# Patient Record
Sex: Male | Born: 1970 | Race: White | Hispanic: No | Smoking: Former smoker
Health system: Southern US, Community
[De-identification: ages and names within clinical notes are randomized; demographics above are authoritative.]

## PROBLEM LIST (undated history)

## (undated) DIAGNOSIS — S0990XA Unspecified injury of head, initial encounter: Secondary | ICD-10-CM

## (undated) DIAGNOSIS — C787 Secondary malignant neoplasm of liver and intrahepatic bile duct: Secondary | ICD-10-CM

## (undated) DIAGNOSIS — R569 Unspecified convulsions: Secondary | ICD-10-CM

## (undated) DIAGNOSIS — K029 Dental caries, unspecified: Secondary | ICD-10-CM

## (undated) DIAGNOSIS — C7931 Secondary malignant neoplasm of brain: Secondary | ICD-10-CM

## (undated) DIAGNOSIS — C3490 Malignant neoplasm of unspecified part of unspecified bronchus or lung: Secondary | ICD-10-CM

## (undated) DIAGNOSIS — I10 Essential (primary) hypertension: Secondary | ICD-10-CM

## (undated) DIAGNOSIS — IMO0001 Reserved for inherently not codable concepts without codable children: Secondary | ICD-10-CM

## (undated) DIAGNOSIS — C801 Malignant (primary) neoplasm, unspecified: Secondary | ICD-10-CM

## (undated) DIAGNOSIS — IMO0002 Reserved for concepts with insufficient information to code with codable children: Secondary | ICD-10-CM

## (undated) HISTORY — DX: Unspecified convulsions: R56.9

## (undated) HISTORY — DX: Essential (primary) hypertension: I10

## (undated) HISTORY — DX: Reserved for concepts with insufficient information to code with codable children: IMO0002

## (undated) HISTORY — DX: Reserved for inherently not codable concepts without codable children: IMO0001

---

## 2005-02-15 DIAGNOSIS — S0990XA Unspecified injury of head, initial encounter: Secondary | ICD-10-CM

## 2005-02-15 HISTORY — PX: CRANIOTOMY: SHX93

## 2005-02-15 HISTORY — DX: Unspecified injury of head, initial encounter: S09.90XA

## 2005-03-09 ENCOUNTER — Inpatient Hospital Stay (HOSPITAL_COMMUNITY): Admission: AC | Admit: 2005-03-09 | Discharge: 2005-03-13 | Payer: Self-pay

## 2005-03-22 ENCOUNTER — Ambulatory Visit (HOSPITAL_COMMUNITY): Admission: RE | Admit: 2005-03-22 | Discharge: 2005-03-22 | Payer: Self-pay | Admitting: Otolaryngology

## 2014-02-15 DIAGNOSIS — K029 Dental caries, unspecified: Secondary | ICD-10-CM

## 2014-02-15 HISTORY — DX: Dental caries, unspecified: K02.9

## 2014-04-16 DIAGNOSIS — C7931 Secondary malignant neoplasm of brain: Secondary | ICD-10-CM

## 2014-04-16 DIAGNOSIS — C3490 Malignant neoplasm of unspecified part of unspecified bronchus or lung: Secondary | ICD-10-CM

## 2014-04-16 DIAGNOSIS — C787 Secondary malignant neoplasm of liver and intrahepatic bile duct: Secondary | ICD-10-CM

## 2014-04-16 HISTORY — DX: Malignant neoplasm of unspecified part of unspecified bronchus or lung: C34.90

## 2014-04-16 HISTORY — DX: Secondary malignant neoplasm of liver and intrahepatic bile duct: C78.7

## 2014-04-16 HISTORY — DX: Secondary malignant neoplasm of brain: C79.31

## 2014-05-13 ENCOUNTER — Other Ambulatory Visit (HOSPITAL_COMMUNITY): Payer: Self-pay

## 2014-05-13 ENCOUNTER — Emergency Department (HOSPITAL_COMMUNITY): Payer: Medicaid Other

## 2014-05-13 ENCOUNTER — Other Ambulatory Visit: Payer: Self-pay

## 2014-05-13 ENCOUNTER — Inpatient Hospital Stay (HOSPITAL_COMMUNITY): Payer: Medicaid Other

## 2014-05-13 ENCOUNTER — Inpatient Hospital Stay (HOSPITAL_COMMUNITY)
Admission: EM | Admit: 2014-05-13 | Discharge: 2014-05-24 | DRG: 208 | Disposition: A | Discharge status: Home Health | Payer: Medicaid Other | Attending: Internal Medicine | Admitting: Internal Medicine

## 2014-05-13 ENCOUNTER — Encounter (HOSPITAL_COMMUNITY): Payer: Self-pay | Admitting: Radiology

## 2014-05-13 DIAGNOSIS — J95811 Postprocedural pneumothorax: Secondary | ICD-10-CM | POA: Diagnosis not present

## 2014-05-13 DIAGNOSIS — F1010 Alcohol abuse, uncomplicated: Secondary | ICD-10-CM | POA: Diagnosis present

## 2014-05-13 DIAGNOSIS — S12600A Unspecified displaced fracture of seventh cervical vertebra, initial encounter for closed fracture: Secondary | ICD-10-CM | POA: Diagnosis present

## 2014-05-13 DIAGNOSIS — R739 Hyperglycemia, unspecified: Secondary | ICD-10-CM | POA: Diagnosis present

## 2014-05-13 DIAGNOSIS — S50312A Abrasion of left elbow, initial encounter: Secondary | ICD-10-CM | POA: Diagnosis present

## 2014-05-13 DIAGNOSIS — C3490 Malignant neoplasm of unspecified part of unspecified bronchus or lung: Secondary | ICD-10-CM | POA: Diagnosis present

## 2014-05-13 DIAGNOSIS — C7931 Secondary malignant neoplasm of brain: Secondary | ICD-10-CM | POA: Diagnosis present

## 2014-05-13 DIAGNOSIS — Y900 Blood alcohol level of less than 20 mg/100 ml: Secondary | ICD-10-CM | POA: Diagnosis present

## 2014-05-13 DIAGNOSIS — N179 Acute kidney failure, unspecified: Secondary | ICD-10-CM | POA: Diagnosis present

## 2014-05-13 DIAGNOSIS — Y838 Other surgical procedures as the cause of abnormal reaction of the patient, or of later complication, without mention of misadventure at the time of the procedure: Secondary | ICD-10-CM | POA: Diagnosis not present

## 2014-05-13 DIAGNOSIS — Y92238 Other place in hospital as the place of occurrence of the external cause: Secondary | ICD-10-CM | POA: Diagnosis not present

## 2014-05-13 DIAGNOSIS — Z791 Long term (current) use of non-steroidal anti-inflammatories (NSAID): Secondary | ICD-10-CM | POA: Diagnosis not present

## 2014-05-13 DIAGNOSIS — M4856XA Collapsed vertebra, not elsewhere classified, lumbar region, initial encounter for fracture: Secondary | ICD-10-CM | POA: Diagnosis present

## 2014-05-13 DIAGNOSIS — R4701 Aphasia: Secondary | ICD-10-CM | POA: Diagnosis present

## 2014-05-13 DIAGNOSIS — T1490 Injury, unspecified: Secondary | ICD-10-CM | POA: Diagnosis present

## 2014-05-13 DIAGNOSIS — C787 Secondary malignant neoplasm of liver and intrahepatic bile duct: Secondary | ICD-10-CM | POA: Diagnosis present

## 2014-05-13 DIAGNOSIS — T1490XA Injury, unspecified, initial encounter: Secondary | ICD-10-CM | POA: Insufficient documentation

## 2014-05-13 DIAGNOSIS — R451 Restlessness and agitation: Secondary | ICD-10-CM | POA: Diagnosis present

## 2014-05-13 DIAGNOSIS — I10 Essential (primary) hypertension: Secondary | ICD-10-CM | POA: Diagnosis present

## 2014-05-13 DIAGNOSIS — R001 Bradycardia, unspecified: Secondary | ICD-10-CM | POA: Diagnosis present

## 2014-05-13 DIAGNOSIS — R1310 Dysphagia, unspecified: Secondary | ICD-10-CM | POA: Diagnosis present

## 2014-05-13 DIAGNOSIS — M549 Dorsalgia, unspecified: Secondary | ICD-10-CM | POA: Diagnosis present

## 2014-05-13 DIAGNOSIS — D72829 Elevated white blood cell count, unspecified: Secondary | ICD-10-CM | POA: Diagnosis present

## 2014-05-13 DIAGNOSIS — E872 Acidosis: Secondary | ICD-10-CM | POA: Diagnosis present

## 2014-05-13 DIAGNOSIS — Z9889 Other specified postprocedural states: Secondary | ICD-10-CM

## 2014-05-13 DIAGNOSIS — T380X5A Adverse effect of glucocorticoids and synthetic analogues, initial encounter: Secondary | ICD-10-CM | POA: Diagnosis present

## 2014-05-13 DIAGNOSIS — G9341 Metabolic encephalopathy: Secondary | ICD-10-CM | POA: Diagnosis present

## 2014-05-13 DIAGNOSIS — C3411 Malignant neoplasm of upper lobe, right bronchus or lung: Secondary | ICD-10-CM | POA: Diagnosis present

## 2014-05-13 DIAGNOSIS — S12500A Unspecified displaced fracture of sixth cervical vertebra, initial encounter for closed fracture: Secondary | ICD-10-CM | POA: Diagnosis present

## 2014-05-13 DIAGNOSIS — G40901 Epilepsy, unspecified, not intractable, with status epilepticus: Secondary | ICD-10-CM | POA: Diagnosis present

## 2014-05-13 DIAGNOSIS — E876 Hypokalemia: Secondary | ICD-10-CM | POA: Diagnosis present

## 2014-05-13 DIAGNOSIS — R918 Other nonspecific abnormal finding of lung field: Secondary | ICD-10-CM

## 2014-05-13 DIAGNOSIS — R0602 Shortness of breath: Secondary | ICD-10-CM

## 2014-05-13 DIAGNOSIS — F17210 Nicotine dependence, cigarettes, uncomplicated: Secondary | ICD-10-CM | POA: Diagnosis present

## 2014-05-13 DIAGNOSIS — G9340 Encephalopathy, unspecified: Secondary | ICD-10-CM | POA: Diagnosis present

## 2014-05-13 DIAGNOSIS — C799 Secondary malignant neoplasm of unspecified site: Secondary | ICD-10-CM

## 2014-05-13 DIAGNOSIS — Z9289 Personal history of other medical treatment: Secondary | ICD-10-CM

## 2014-05-13 DIAGNOSIS — C801 Malignant (primary) neoplasm, unspecified: Secondary | ICD-10-CM

## 2014-05-13 DIAGNOSIS — J9601 Acute respiratory failure with hypoxia: Secondary | ICD-10-CM | POA: Diagnosis present

## 2014-05-13 DIAGNOSIS — S1093XA Contusion of unspecified part of neck, initial encounter: Secondary | ICD-10-CM

## 2014-05-13 DIAGNOSIS — R160 Hepatomegaly, not elsewhere classified: Secondary | ICD-10-CM

## 2014-05-13 DIAGNOSIS — R569 Unspecified convulsions: Secondary | ICD-10-CM

## 2014-05-13 HISTORY — DX: Unspecified injury of head, initial encounter: S09.90XA

## 2014-05-13 HISTORY — DX: Dental caries, unspecified: K02.9

## 2014-05-13 LAB — PREPARE FRESH FROZEN PLASMA
UNIT DIVISION: 0
Unit division: 0

## 2014-05-13 LAB — TYPE AND SCREEN
ABO/RH(D): O NEG
Antibody Screen: NEGATIVE
Unit division: 0
Unit division: 0

## 2014-05-13 LAB — I-STAT ARTERIAL BLOOD GAS, ED
Acid-base deficit: 9 mmol/L — ABNORMAL HIGH (ref 0.0–2.0)
Acid-base deficit: 8 mmol/L — ABNORMAL HIGH (ref 0.0–2.0)
Bicarbonate: 19.5 mEq/L — ABNORMAL LOW (ref 20.0–24.0)
Bicarbonate: 17.4 mEq/L — ABNORMAL LOW (ref 20.0–24.0)
O2 Saturation: 100 %
O2 Saturation: 99 %
PH ART: 7.183 — AB (ref 7.350–7.450)
TCO2: 21 mmol/L (ref 0–100)
TCO2: 18 mmol/L (ref 0–100)
pCO2 arterial: 51.9 mmHg — ABNORMAL HIGH (ref 35.0–45.0)
pCO2 arterial: 33.5 mmHg — ABNORMAL LOW (ref 35.0–45.0)
pH, Arterial: 7.324 — ABNORMAL LOW (ref 7.350–7.450)
pO2, Arterial: 286 mmHg — ABNORMAL HIGH (ref 80.0–100.0)
pO2, Arterial: 160 mmHg — ABNORMAL HIGH (ref 80.0–100.0)

## 2014-05-13 LAB — COMPREHENSIVE METABOLIC PANEL
ALBUMIN: 3.2 g/dL — AB (ref 3.5–5.2)
ALK PHOS: 119 U/L — AB (ref 39–117)
ALT: 52 U/L (ref 0–53)
ALT: 44 U/L (ref 0–53)
ANION GAP: 23 — AB (ref 5–15)
ANION GAP: 8 (ref 5–15)
AST: 99 U/L — ABNORMAL HIGH (ref 0–37)
AST: 77 U/L — ABNORMAL HIGH (ref 0–37)
Albumin: 3.9 g/dL (ref 3.5–5.2)
Alkaline Phosphatase: 177 U/L — ABNORMAL HIGH (ref 39–117)
BILIRUBIN TOTAL: 0.6 mg/dL (ref 0.3–1.2)
BUN: 10 mg/dL (ref 6–23)
BUN: 10 mg/dL (ref 6–23)
CALCIUM: 9.7 mg/dL (ref 8.4–10.5)
CHLORIDE: 107 mmol/L (ref 96–112)
CO2: 13 mmol/L — AB (ref 19–32)
CO2: 18 mmol/L — ABNORMAL LOW (ref 19–32)
CREATININE: 1.23 mg/dL (ref 0.50–1.35)
CREATININE: 0.96 mg/dL (ref 0.50–1.35)
Calcium: 7.8 mg/dL — ABNORMAL LOW (ref 8.4–10.5)
Chloride: 102 mmol/L (ref 96–112)
GFR calc Af Amer: 80 mL/min — ABNORMAL LOW (ref >90)
GFR calc Af Amer: >90 mL/min (ref >90)
GFR calc non Af Amer: 69 mL/min — ABNORMAL LOW (ref >90)
Glucose, Bld: 272 mg/dL — ABNORMAL HIGH (ref 70–99)
Glucose, Bld: 242 mg/dL — ABNORMAL HIGH (ref 70–99)
Potassium: 3.2 mmol/L — ABNORMAL LOW (ref 3.5–5.1)
Potassium: 3.6 mmol/L (ref 3.5–5.1)
Sodium: 138 mmol/L (ref 135–145)
Sodium: 133 mmol/L — ABNORMAL LOW (ref 135–145)
TOTAL PROTEIN: 7.9 g/dL (ref 6.0–8.3)
Total Bilirubin: 0.7 mg/dL (ref 0.3–1.2)
Total Protein: 6.2 g/dL (ref 6.0–8.3)

## 2014-05-13 LAB — CBC
HCT: 52.9 % — ABNORMAL HIGH (ref 39.0–52.0)
Hemoglobin: 17.8 g/dL — ABNORMAL HIGH (ref 13.0–17.0)
MCH: 32.1 pg (ref 26.0–34.0)
MCHC: 33.6 g/dL (ref 30.0–36.0)
MCV: 95.5 fL (ref 78.0–100.0)
Platelets: 223 10*3/uL (ref 150–400)
RBC: 5.54 MIL/uL (ref 4.22–5.81)
RDW: 12.3 % (ref 11.5–15.5)
WBC: 11.8 10*3/uL — AB (ref 4.0–10.5)

## 2014-05-13 LAB — CDS SEROLOGY

## 2014-05-13 LAB — ABO/RH: ABO/RH(D): O NEG

## 2014-05-13 LAB — CBC WITH DIFFERENTIAL/PLATELET
Basophils Absolute: 0 10*3/uL (ref 0.0–0.1)
Basophils Relative: 0 % (ref 0–1)
EOS ABS: 0 10*3/uL (ref 0.0–0.7)
Eosinophils Relative: 0 % (ref 0–5)
HCT: 43.6 % (ref 39.0–52.0)
HEMOGLOBIN: 15.5 g/dL (ref 13.0–17.0)
LYMPHS ABS: 0.5 10*3/uL — AB (ref 0.7–4.0)
LYMPHS PCT: 4 % — AB (ref 12–46)
MCH: 31.8 pg (ref 26.0–34.0)
MCHC: 35.6 g/dL (ref 30.0–36.0)
MCV: 89.3 fL (ref 78.0–100.0)
MONOS PCT: 9 % (ref 3–12)
Monocytes Absolute: 1 10*3/uL (ref 0.1–1.0)
Neutro Abs: 9.7 10*3/uL — ABNORMAL HIGH (ref 1.7–7.7)
Neutrophils Relative %: 87 % — ABNORMAL HIGH (ref 43–77)
PLATELETS: 200 10*3/uL (ref 150–400)
RBC: 4.88 MIL/uL (ref 4.22–5.81)
RDW: 12.1 % (ref 11.5–15.5)
WBC: 11.2 10*3/uL — AB (ref 4.0–10.5)

## 2014-05-13 LAB — GLUCOSE, CAPILLARY
Glucose-Capillary: 180 mg/dL — ABNORMAL HIGH (ref 70–99)
Glucose-Capillary: 139 mg/dL — ABNORMAL HIGH (ref 70–99)
Glucose-Capillary: 124 mg/dL — ABNORMAL HIGH (ref 70–99)

## 2014-05-13 LAB — PROTIME-INR
INR: 1.22 (ref 0.00–1.49)
INR: 1.18 (ref 0.00–1.49)
PROTHROMBIN TIME: 15.1 s (ref 11.6–15.2)
Prothrombin Time: 15.5 seconds — ABNORMAL HIGH (ref 11.6–15.2)

## 2014-05-13 LAB — MAGNESIUM: Magnesium: 2.4 mg/dL (ref 1.5–2.5)

## 2014-05-13 LAB — ETHANOL: Alcohol, Ethyl (B): <5 mg/dL (ref 0–9)

## 2014-05-13 LAB — LACTIC ACID, PLASMA
LACTIC ACID, VENOUS: 1.3 mmol/L (ref 0.5–2.0)
Lactic Acid, Venous: 1.8 mmol/L (ref 0.5–2.0)

## 2014-05-13 LAB — PHOSPHORUS: Phosphorus: <1 mg/dL — CL (ref 2.3–4.6)

## 2014-05-13 LAB — APTT: APTT: 28 s (ref 24–37)

## 2014-05-13 LAB — I-STAT TROPONIN, ED: TROPONIN I, POC: 0 ng/mL (ref 0.00–0.08)

## 2014-05-13 LAB — MRSA PCR SCREENING: MRSA BY PCR: NEGATIVE

## 2014-05-13 LAB — TRIGLYCERIDES: Triglycerides: 237 mg/dL — ABNORMAL HIGH (ref <150)

## 2014-05-13 MED ORDER — SODIUM CHLORIDE 0.9 % IV SOLN
5.0000 ug/h | INTRAVENOUS | Status: DC
  Filled 2014-05-13: qty 50

## 2014-05-13 MED ORDER — FENTANYL CITRATE 0.05 MG/ML IJ SOLN
INTRAMUSCULAR | Status: AC | PRN
  Administered 2014-05-13: 50 ug via INTRAVENOUS

## 2014-05-13 MED ORDER — PROPOFOL 10 MG/ML IV BOLUS
INTRAVENOUS | Status: AC | PRN
  Administered 2014-05-13: 25 ug via INTRAVENOUS

## 2014-05-13 MED ORDER — LEVETIRACETAM IN NACL 1000 MG/100ML IV SOLN
1000.0000 mg | Freq: Two times a day (BID) | INTRAVENOUS | Status: DC
  Administered 2014-05-13: 1000 mg via INTRAVENOUS
  Filled 2014-05-13 (×2): qty 100

## 2014-05-13 MED ORDER — METOPROLOL TARTRATE 25 MG/10 ML ORAL SUSPENSION
25.0000 mg | Freq: Two times a day (BID) | ORAL | Status: DC
  Filled 2014-05-13 (×6): qty 10

## 2014-05-13 MED ORDER — IOHEXOL 350 MG/ML SOLN
100.0000 mL | Freq: Once | INTRAVENOUS | Status: AC | PRN
  Administered 2014-05-13: 100 mL via INTRAVENOUS

## 2014-05-13 MED ORDER — PROPOFOL 10 MG/ML IV EMUL
INTRAVENOUS | Status: DC | PRN
  Administered 2014-05-13: 30 ug/kg/min via INTRAVENOUS

## 2014-05-13 MED ORDER — PROPOFOL 10 MG/ML IV EMUL
INTRAVENOUS | Status: AC | PRN
  Administered 2014-05-13: 25 ug/kg/min via INTRAVENOUS

## 2014-05-13 MED ORDER — CETYLPYRIDINIUM CHLORIDE 0.05 % MT LIQD
7.0000 mL | Freq: Four times a day (QID) | OROMUCOSAL | Status: DC
  Administered 2014-05-14 (×4): 7 mL via OROMUCOSAL

## 2014-05-13 MED ORDER — FENTANYL CITRATE 0.05 MG/ML IJ SOLN
100.0000 ug | INTRAMUSCULAR | Status: DC | PRN
  Administered 2014-05-13 – 2014-05-14 (×5): 100 ug via INTRAVENOUS
  Filled 2014-05-13 (×5): qty 2

## 2014-05-13 MED ORDER — FENTANYL CITRATE 0.05 MG/ML IJ SOLN
100.0000 ug | INTRAMUSCULAR | Status: DC | PRN
  Administered 2014-05-13 (×2): 100 ug via INTRAVENOUS
  Filled 2014-05-13 (×3): qty 2

## 2014-05-13 MED ORDER — SODIUM CHLORIDE 0.9 % IV SOLN
INTRAVENOUS | Status: AC | PRN
  Administered 2014-05-13: 1000 mL/h via INTRAVENOUS

## 2014-05-13 MED ORDER — DEXAMETHASONE SODIUM PHOSPHATE 10 MG/ML IJ SOLN
10.0000 mg | Freq: Once | INTRAMUSCULAR | Status: AC
  Administered 2014-05-13: 10 mg via INTRAVENOUS
  Filled 2014-05-13: qty 1

## 2014-05-13 MED ORDER — SODIUM PHOSPHATE 3 MMOLE/ML IV SOLN
30.0000 mmol | Freq: Once | INTRAVENOUS | Status: AC
  Administered 2014-05-13: 30 mmol via INTRAVENOUS
  Filled 2014-05-13: qty 10

## 2014-05-13 MED ORDER — PROPOFOL 10 MG/ML IV EMUL
0.0000 ug/kg/min | INTRAVENOUS | Status: DC
  Administered 2014-05-13 – 2014-05-14 (×7): 50 ug/kg/min via INTRAVENOUS
  Filled 2014-05-13 (×7): qty 100

## 2014-05-13 MED ORDER — SODIUM CHLORIDE 0.9 % IV BOLUS (SEPSIS)
1000.0000 mL | Freq: Once | INTRAVENOUS | Status: AC
  Administered 2014-05-13: 1000 mL via INTRAVENOUS

## 2014-05-13 MED ORDER — FENTANYL CITRATE 0.05 MG/ML IJ SOLN
INTRAMUSCULAR | Status: AC
  Filled 2014-05-13: qty 2

## 2014-05-13 MED ORDER — CHLORHEXIDINE GLUCONATE 0.12 % MT SOLN
15.0000 mL | Freq: Two times a day (BID) | OROMUCOSAL | Status: DC
  Administered 2014-05-13 – 2014-05-14 (×2): 15 mL via OROMUCOSAL
  Filled 2014-05-13 (×2): qty 15

## 2014-05-13 MED ORDER — LORAZEPAM 2 MG/ML IJ SOLN
INTRAMUSCULAR | Status: AC | PRN
  Administered 2014-05-13: 2 mg via INTRAVENOUS

## 2014-05-13 MED ORDER — POTASSIUM CHLORIDE 10 MEQ/100ML IV SOLN
10.0000 meq | INTRAVENOUS | Status: AC
  Administered 2014-05-13 (×2): 10 meq via INTRAVENOUS
  Filled 2014-05-13 (×2): qty 100

## 2014-05-13 MED ORDER — ROCURONIUM BROMIDE 50 MG/5ML IV SOLN
INTRAVENOUS | Status: AC | PRN
  Administered 2014-05-13: 100 mg via INTRAVENOUS

## 2014-05-13 MED ORDER — LEVETIRACETAM IN NACL 500 MG/100ML IV SOLN
500.0000 mg | Freq: Two times a day (BID) | INTRAVENOUS | Status: DC
  Administered 2014-05-13 – 2014-05-14 (×3): 500 mg via INTRAVENOUS
  Filled 2014-05-13 (×5): qty 100

## 2014-05-13 MED ORDER — INSULIN ASPART 100 UNIT/ML ~~LOC~~ SOLN
0.0000 [IU] | SUBCUTANEOUS | Status: DC
  Administered 2014-05-13: 3 [IU] via SUBCUTANEOUS
  Administered 2014-05-13 – 2014-05-14 (×5): 2 [IU] via SUBCUTANEOUS
  Administered 2014-05-14 – 2014-05-15 (×2): 3 [IU] via SUBCUTANEOUS

## 2014-05-13 MED ORDER — ETOMIDATE 2 MG/ML IV SOLN
INTRAVENOUS | Status: AC | PRN
  Administered 2014-05-13: 20 mg via INTRAVENOUS

## 2014-05-13 MED ORDER — DEXAMETHASONE SODIUM PHOSPHATE 4 MG/ML IJ SOLN
4.0000 mg | Freq: Four times a day (QID) | INTRAMUSCULAR | Status: DC
  Administered 2014-05-13 – 2014-05-23 (×38): 4 mg via INTRAVENOUS
  Filled 2014-05-13 (×47): qty 1

## 2014-05-13 MED ORDER — LORAZEPAM 2 MG/ML IJ SOLN
2.0000 mg | Freq: Once | INTRAMUSCULAR | Status: AC
  Administered 2014-05-13: 2 mg via INTRAVENOUS
  Filled 2014-05-13: qty 1

## 2014-05-13 MED ORDER — LORAZEPAM 2 MG/ML IJ SOLN
INTRAMUSCULAR | Status: AC
  Filled 2014-05-13: qty 1

## 2014-05-13 NOTE — Progress Notes (Signed)
Stable. Intubated, spoke with fiancee

## 2014-05-13 NOTE — Progress Notes (Signed)
eLink Physician-Brief Progress Note Patient Name: [name] DOB: 11-Aug-1970 MRN: [id number]   Date of Service  05/13/2014  HPI/Events of Note  Phosphorus level < 1.0.  eICU Interventions  Replete phosphorus. Recheck phosphorus level at 12 midnight.      Intervention Category Major Interventions: Electrolyte abnormality - evaluation and management  Sommer,Steven Eugene 05/13/2014, 5:08 PM
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
## 2014-05-13 NOTE — ED Notes (Signed)
Spoke with Dr. Nelda Marseille concerning [facility]. Dr. Nelda Marseille felt he could go to neuro ICU. Bed control called at this time and [facility] called to inform.

## 2014-05-13 NOTE — Progress Notes (Signed)
PCCM Interval Progress Note  Met with pt's two sisters [name] and Alyssa) as well as pt's fiance Cyril Mourning).  Extensive discussion had as I updated them on pt's current status as well as radiologic findings concerning for malignancy with metastatic disease, unknown primary. Family in shock as this is totally new and they had no idea of possible underlying malignancy.  They state that besides HTN, pt was otherwise healthy.  He had no recent symptoms at all and has never had to see a PCP for any chronic medical problems.  They are leaning towards aggressive care including biopsies and wish to meet with heme - onc to discuss possible treatments.  They do realize that there is a concern for advanced cancer given radiologic suggestion of metastasis.  They would like to defer decision on pursuing biopsies (we briefly discussed liver biopsy via IR) until pt is extubated and has time to take in these findings and come up with a decision on how he would like to proceed.  They will be at the bedside tomorrow morning, 05/14/14 by 900am in hopes that pt can be extubated so that they can begin discussions on how to move forward from here.   Additional CC time:  20 minutes.   Montey Hora, [place] [facility] Pager: [phone]  or [phone] 05/13/2014, 4:06 PM

## 2014-05-13 NOTE — ED Notes (Signed)
Dr. Reather Converse made aware of pt's BP

## 2014-05-13 NOTE — Progress Notes (Signed)
Patient and ct see. Spoke with wife. He will need the cervical brace for at least 6 to 8 weeks. Once he is awake will talk to him about the tumors. Note dictated

## 2014-05-13 NOTE — ED Notes (Signed)
RT aware pt ready for transport to [facility]
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
## 2014-05-13 NOTE — ED Notes (Signed)
Family at beside. Family given emotional support. 

## 2014-05-13 NOTE — Progress Notes (Signed)
CRITICAL VALUE ALERT  Critical value received:  Phosphorus <1.0  Date of notification:  05/13/14  Time of notification:  1601 (from ED)  Critical value read back: yes  Nurse who received alert:  Dulcy Fanny, RN  MD notified (1st page):  called elink to notify MD.  Time of first page:   1700 - called elink  MD notified (2nd page):  Time of second page:  Responding MD:  elink RN to notify MD  Time MD responded:
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
## 2014-05-13 NOTE — Progress Notes (Signed)
Chaplain referred to pt family from ED chaplain. Chaplain introduced herself and her services to pt fiance, Cyril Mourning, and pt sisters, [name] and Alyssa. Chaplain introduced pt family to nurse. Chaplain will continue to follow.    05/13/14 1500  Clinical Encounter Type  Visited With Family  Visit Type Follow-up;Spiritual support  Referral From [place] [facility]eds Emotional  Thea Holshouser, Epifanio Lesches 05/13/2014 3:13 PM

## 2014-05-13 NOTE — Progress Notes (Signed)
eLink Physician-Brief Progress Note Patient Name: Jaremy Nosal DOB: December 09, 1970 MRN: [id number]   Date of Service  05/13/2014  HPI/Events of Note  Notified of need for DVT prophylaxis.   eICU Interventions  Will order SCD's.     Intervention Category Intermediate Interventions: Best-practice therapies (e.g. DVT, beta blocker, etc.)  Alassane Kalafut Eugene 05/13/2014, 4:39 PM
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
## 2014-05-13 NOTE — ED Notes (Signed)
RT back in to adjust vent settings for sats. sats back up to 98%

## 2014-05-13 NOTE — ED Notes (Signed)
Attempted report 

## 2014-05-13 NOTE — Procedures (Signed)
ELECTROENCEPHALOGRAM REPORT   Patient: [name]      Room #: Trauma B [facility] ED Age: 44 y.o.        Sex: male Referring Physician: Dr Nelda Marseille Report Date:  05/13/2014        Interpreting Physician: Hulen Luster  History: Maddax Palinkas is an 44 y.o. male new onset seizures with CT imaging concerning for metastatic disease  Medications:  Scheduled: . dexamethasone  10 mg Intravenous Once  . fentaNYL      . insulin aspart  0-15 Units Subcutaneous 6 times per day  . levETIRAcetam  1,000 mg Intravenous Q12H  . LORazepam      . metoprolol tartrate  25 mg Per Tube BID    Conditions of Recording:  This is a 16 channel EEG carried out with the patient in the sedeated state.  Description:  The waking background activity consists of a low voltage, symmetrical, fairly well organized, 9-11 Hz alpha activity, seen from the parieto-occipital and posterior temporal regions. Intermittent posterior theta activity is mixed into the posterior region. Low voltage fast activity, poorly organized, is seen anteriorly and is at times superimposed on more posterior regions.   Normal sleep architecture is not observed. Focal slowing is noted in the left temporal region. No epileptiform activity is noted.   Photic stimulation and hyperventilation are not performed.    IMPRESSION: Abnormal EEG due to intermittent focal slowing in the left temporal region indicating a potential seizure foci. No epileptiform activity or discharges noted.    Jim Like, DO [facility] [phone]  If 7pm- 7am, please page neurology on call as listed in [place]. 05/13/2014, 12:49 PM

## 2014-05-13 NOTE — ED Notes (Signed)
Pt ready for CT, awaiting RT

## 2014-05-13 NOTE — ED Notes (Signed)
Propofol turned down some, sats in 80's with good pleth wave.

## 2014-05-13 NOTE — Progress Notes (Signed)
Stat EEG completed, Dr Doy Mince viewed during recording. Results pending.

## 2014-05-13 NOTE — ED Notes (Signed)
Portable xrays completed.

## 2014-05-13 NOTE — Progress Notes (Signed)
Patient ID: [name], male   DOB: [birth year], 44 y.o.   MRN: [id number] CT reformats reveal C6 TVP FX and C7 body FX. Change to [place] collar, I consulted Dr. Joya Salm from neurosurgery. I also mentioned the old looking T and L spine end plate FXs. I also D/W Dr. Nelda Marseille. Georganna Skeans, MD, MPH, FACS Trauma: [phone] General Surgery: [phone]
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
## 2014-05-14 ENCOUNTER — Inpatient Hospital Stay (HOSPITAL_COMMUNITY): Payer: Medicaid Other

## 2014-05-14 ENCOUNTER — Encounter (HOSPITAL_COMMUNITY): Payer: Self-pay | Admitting: Physician Assistant

## 2014-05-14 DIAGNOSIS — T1490 Injury, unspecified: Secondary | ICD-10-CM

## 2014-05-14 DIAGNOSIS — R933 Abnormal findings on diagnostic imaging of other parts of digestive tract: Secondary | ICD-10-CM

## 2014-05-14 DIAGNOSIS — T1490XA Injury, unspecified, initial encounter: Secondary | ICD-10-CM | POA: Insufficient documentation

## 2014-05-14 LAB — GLUCOSE, CAPILLARY
GLUCOSE-CAPILLARY: 118 mg/dL — AB (ref 70–99)
Glucose-Capillary: 119 mg/dL — ABNORMAL HIGH (ref 70–99)
Glucose-Capillary: 130 mg/dL — ABNORMAL HIGH (ref 70–99)
Glucose-Capillary: 131 mg/dL — ABNORMAL HIGH (ref 70–99)
Glucose-Capillary: 134 mg/dL — ABNORMAL HIGH (ref 70–99)
Glucose-Capillary: 152 mg/dL — ABNORMAL HIGH (ref 70–99)

## 2014-05-14 LAB — BLOOD GAS, ARTERIAL
Acid-base deficit: 5.1 mmol/L — ABNORMAL HIGH (ref 0.0–2.0)
Bicarbonate: 18.2 mEq/L — ABNORMAL LOW (ref 20.0–24.0)
Drawn by: 398661
FIO2: 0.4 %
O2 SAT: 93.8 %
PEEP/CPAP: 5 cmH2O
PO2 ART: 70.1 mmHg — AB (ref 80.0–100.0)
Patient temperature: 98.6
RATE: 22 resp/min
TCO2: 19 mmol/L (ref 0–100)
VT: 580 mL
pCO2 arterial: 26.8 mmHg — ABNORMAL LOW (ref 35.0–45.0)
pH, Arterial: 7.447 (ref 7.350–7.450)

## 2014-05-14 LAB — CBC
HCT: 42.8 % (ref 39.0–52.0)
HEMOGLOBIN: 14.9 g/dL (ref 13.0–17.0)
MCH: 31.7 pg (ref 26.0–34.0)
MCHC: 34.8 g/dL (ref 30.0–36.0)
MCV: 91.1 fL (ref 78.0–100.0)
Platelets: 206 10*3/uL (ref 150–400)
RBC: 4.7 MIL/uL (ref 4.22–5.81)
RDW: 12.3 % (ref 11.5–15.5)
WBC: 8 10*3/uL (ref 4.0–10.5)

## 2014-05-14 LAB — HEMOGLOBIN A1C
HEMOGLOBIN A1C: 6.1 % — AB (ref 4.8–5.6)
Mean Plasma Glucose: 128 mg/dL

## 2014-05-14 LAB — URINE DRUGS OF ABUSE SCREEN W ALC, ROUTINE (REF LAB)
AMPHETAMINE SCRN UR: NEGATIVE
Barbiturate Quant, Ur: NEGATIVE
Benzodiazepines.: NEGATIVE
COCAINE METABOLITES: NEGATIVE
Creatinine,U: 21.2 mg/dL
Ethyl Alcohol: <10 mg/dL (ref <10)
METHADONE: NEGATIVE
Marijuana Metabolite: NEGATIVE
Opiate Screen, Urine: NEGATIVE
PHENCYCLIDINE (PCP): NEGATIVE
PROPOXYPHENE: NEGATIVE

## 2014-05-14 LAB — BASIC METABOLIC PANEL
Anion gap: 7 (ref 5–15)
BUN: 11 mg/dL (ref 6–23)
CALCIUM: 8.4 mg/dL (ref 8.4–10.5)
CO2: 19 mmol/L (ref 19–32)
Chloride: 109 mmol/L (ref 96–112)
Creatinine, Ser: 0.87 mg/dL (ref 0.50–1.35)
GFR calc Af Amer: >90 mL/min (ref >90)
GFR calc non Af Amer: >90 mL/min (ref >90)
Glucose, Bld: 138 mg/dL — ABNORMAL HIGH (ref 70–99)
Potassium: 3.5 mmol/L (ref 3.5–5.1)
SODIUM: 135 mmol/L (ref 135–145)

## 2014-05-14 LAB — PHOSPHORUS
PHOSPHORUS: 2.9 mg/dL (ref 2.3–4.6)
Phosphorus: 2.8 mg/dL (ref 2.3–4.6)

## 2014-05-14 LAB — BLOOD PRODUCT ORDER (VERBAL) VERIFICATION

## 2014-05-14 LAB — MAGNESIUM: Magnesium: 2.6 mg/dL — ABNORMAL HIGH (ref 1.5–2.5)

## 2014-05-14 LAB — LACTIC ACID, PLASMA: LACTIC ACID, VENOUS: 1.2 mmol/L (ref 0.5–2.0)

## 2014-05-14 MED ORDER — MORPHINE SULFATE 2 MG/ML IJ SOLN
2.0000 mg | INTRAMUSCULAR | Status: DC | PRN
  Administered 2014-05-14 (×2): 4 mg via INTRAVENOUS
  Administered 2014-05-14: 2 mg via INTRAVENOUS
  Administered 2014-05-15: 4 mg via INTRAVENOUS
  Filled 2014-05-14 (×3): qty 2

## 2014-05-14 MED ORDER — LORAZEPAM 2 MG/ML IJ SOLN
INTRAMUSCULAR | Status: AC
  Filled 2014-05-14: qty 1

## 2014-05-14 MED ORDER — LORAZEPAM 2 MG/ML IJ SOLN
2.0000 mg | INTRAMUSCULAR | Status: DC | PRN
  Administered 2014-05-14 – 2014-05-15 (×2): 2 mg via INTRAVENOUS
  Filled 2014-05-14: qty 1

## 2014-05-14 MED ORDER — FOLIC ACID 5 MG/ML IJ SOLN
1.0000 mg | Freq: Every day | INTRAMUSCULAR | Status: DC
  Administered 2014-05-14: 1 mg via INTRAVENOUS
  Filled 2014-05-14 (×2): qty 0.2

## 2014-05-14 MED ORDER — MORPHINE SULFATE 2 MG/ML IJ SOLN
INTRAMUSCULAR | Status: AC
  Filled 2014-05-14: qty 1

## 2014-05-14 MED ORDER — M.V.I. ADULT IV INJ
INJECTION | Freq: Once | INTRAVENOUS | Status: AC
  Administered 2014-05-14: 15:00:00 via INTRAVENOUS
  Filled 2014-05-14: qty 1000

## 2014-05-14 MED ORDER — THIAMINE HCL 100 MG/ML IJ SOLN
100.0000 mg | Freq: Every day | INTRAMUSCULAR | Status: DC
  Administered 2014-05-14: 100 mg via INTRAVENOUS
  Filled 2014-05-14 (×2): qty 1
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
## 2014-05-14 NOTE — Progress Notes (Signed)
Pt had foley catheter in place, no urine output since 2200. Foley irrigated, and placement checked still no output. Took out the foley cath and patient started urinating on his own. Condom cath applied.
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
## 2014-05-14 NOTE — Procedures (Signed)
Extubation Procedure Note  Patient Details:   Name: [name] DOB: 1970/02/28 MRN: [id number]   Airway Documentation:     Evaluation  O2 sats: stable throughout Complications: No apparent complications Patient did tolerate procedure well. Bilateral Breath Sounds: Clear, Diminished Suctioning: Oral Yes   Patient extubated to 2lnc. Vital signs stable at this time. No complications. Patient tolerated well. RN and family at bedside. RT will continue to monitor.  Mcneil Sober 05/14/2014, 11:47 AM
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
## 2014-05-14 NOTE — Progress Notes (Signed)
Patient ID: [name], male   DOB: 1971/01/30, 44 y.o.   MRN: [id number] Neuro , awake, f/c. No weakness. Continue with cervical collar. Decision to be made about the several tumors

## 2014-05-15 ENCOUNTER — Inpatient Hospital Stay (HOSPITAL_COMMUNITY): Payer: Medicaid Other

## 2014-05-15 ENCOUNTER — Encounter (HOSPITAL_COMMUNITY): Payer: Self-pay | Admitting: Pulmonary Disease

## 2014-05-15 DIAGNOSIS — G40901 Epilepsy, unspecified, not intractable, with status epilepticus: Secondary | ICD-10-CM | POA: Insufficient documentation

## 2014-05-15 DIAGNOSIS — G40301 Generalized idiopathic epilepsy and epileptic syndromes, not intractable, with status epilepticus: Secondary | ICD-10-CM

## 2014-05-15 DIAGNOSIS — R160 Hepatomegaly, not elsewhere classified: Secondary | ICD-10-CM | POA: Insufficient documentation

## 2014-05-15 DIAGNOSIS — C799 Secondary malignant neoplasm of unspecified site: Secondary | ICD-10-CM

## 2014-05-15 LAB — GLUCOSE, CAPILLARY
GLUCOSE-CAPILLARY: 141 mg/dL — AB (ref 70–99)
GLUCOSE-CAPILLARY: 158 mg/dL — AB (ref 70–99)
GLUCOSE-CAPILLARY: 150 mg/dL — AB (ref 70–99)
Glucose-Capillary: 168 mg/dL — ABNORMAL HIGH (ref 70–99)
Glucose-Capillary: 119 mg/dL — ABNORMAL HIGH (ref 70–99)
Glucose-Capillary: 174 mg/dL — ABNORMAL HIGH (ref 70–99)

## 2014-05-15 MED ORDER — LEVETIRACETAM 500 MG PO TABS
500.0000 mg | ORAL_TABLET | Freq: Two times a day (BID) | ORAL | Status: DC
  Administered 2014-05-15 – 2014-05-24 (×19): 500 mg via ORAL
  Filled 2014-05-15 (×24): qty 1

## 2014-05-15 MED ORDER — TRAMADOL HCL 50 MG PO TABS
50.0000 mg | ORAL_TABLET | Freq: Four times a day (QID) | ORAL | Status: DC | PRN
  Administered 2014-05-18: 50 mg via ORAL
  Filled 2014-05-15: qty 1

## 2014-05-15 MED ORDER — HYDROMORPHONE HCL 2 MG PO TABS
1.0000 mg | ORAL_TABLET | ORAL | Status: DC | PRN
  Administered 2014-05-20 – 2014-05-24 (×5): 1 mg via ORAL
  Filled 2014-05-15 (×6): qty 1

## 2014-05-15 MED ORDER — OXYCODONE-ACETAMINOPHEN 5-325 MG PO TABS
1.0000 | ORAL_TABLET | ORAL | Status: DC | PRN
  Administered 2014-05-15 – 2014-05-21 (×19): 2 via ORAL
  Filled 2014-05-15 (×21): qty 2

## 2014-05-15 MED ORDER — METOPROLOL TARTRATE 25 MG PO TABS
25.0000 mg | ORAL_TABLET | Freq: Two times a day (BID) | ORAL | Status: DC
  Administered 2014-05-15 – 2014-05-24 (×19): 25 mg via ORAL
  Filled 2014-05-15 (×22): qty 1

## 2014-05-15 MED ORDER — HYDROMORPHONE HCL 1 MG/ML IJ SOLN
1.0000 mg | INTRAMUSCULAR | Status: DC | PRN
  Administered 2014-05-15 – 2014-05-24 (×44): 1 mg via INTRAVENOUS
  Filled 2014-05-15 (×45): qty 1

## 2014-05-15 MED ORDER — INSULIN ASPART 100 UNIT/ML ~~LOC~~ SOLN
0.0000 [IU] | Freq: Three times a day (TID) | SUBCUTANEOUS | Status: DC
  Administered 2014-05-15: 4 [IU] via SUBCUTANEOUS
  Administered 2014-05-15: 5 [IU] via SUBCUTANEOUS
  Administered 2014-05-16 – 2014-05-18 (×4): 3 [IU] via SUBCUTANEOUS
  Administered 2014-05-18 – 2014-05-19 (×3): 4 [IU] via SUBCUTANEOUS
  Administered 2014-05-19: 3 [IU] via SUBCUTANEOUS
  Administered 2014-05-20 (×2): 4 [IU] via SUBCUTANEOUS
  Administered 2014-05-21: 3 [IU] via SUBCUTANEOUS
  Administered 2014-05-21: 4 [IU] via SUBCUTANEOUS
  Administered 2014-05-22 – 2014-05-23 (×3): 3 [IU] via SUBCUTANEOUS

## 2014-05-15 MED ORDER — INSULIN ASPART 100 UNIT/ML ~~LOC~~ SOLN
0.0000 [IU] | Freq: Every day | SUBCUTANEOUS | Status: DC
  Administered 2014-05-19: 2 [IU] via SUBCUTANEOUS

## 2014-05-15 MED ORDER — ACETAMINOPHEN 325 MG PO TABS: 650.0000 mg | ORAL_TABLET | Freq: Four times a day (QID) | ORAL | Status: DC | PRN
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
## 2014-05-15 NOTE — Progress Notes (Signed)
Earlier pt transported to MRI, pt c/o pain, has anxiety for close space and refused to continue MRI . MD on call made aware. Orders will be given tomorrow when pt will have MRI done.

## 2014-05-15 NOTE — Progress Notes (Signed)
Patient ID: [name], male   DOB: 07/22/1970, 44 y.o.   MRN: [id number] Neuro stable. W/u for cancer started. Continue with cervical brace
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
## 2014-05-15 NOTE — Progress Notes (Signed)
UR Completed.  [phone]  

## 2014-05-15 NOTE — Progress Notes (Signed)
Rehab Admissions Coordinator Note:  Patient was screened by Marynell Bies L for appropriateness for an Inpatient Acute Rehab Consult.  At this time, we are recommending Inpatient Rehab consult.  Lorra Freeman L 05/15/2014, 11:22 AM  I can be reached at [phone].
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
## 2014-05-15 NOTE — Progress Notes (Signed)
eLink Physician-Brief Progress Note Patient Name: [name] DOB: 08/13/1970 MRN: [id number]   Date of Service  05/15/2014  HPI/Events of Note  Patient requested to not have MRI tonight  eICU Interventions  Reschedule MRI for the AM.      Intervention Category Minor Interventions: Other:  Aashka Salomone 05/15/2014, 9:09 PM

## 2014-05-15 NOTE — Progress Notes (Signed)
Subjective: Patient has had no further seizure activity.  On Keppra and appears to be tolerating well.  Extubated and in bedside chair.    Objective: Current vital signs: BP 118/79 mmHg  Pulse 78  Temp(Src) 98 F (36.7 C) (Oral)  Resp 18  Ht 5\' 10"  (1.778 m)  Wt 87.3 kg (192 lb 7.4 oz)  BMI 27.62 kg/m2  SpO2 91% Vital signs in last 24 hours: Temp:  [98 F (36.7 C)-98.5 F (36.9 C)] 98 F (36.7 C) (03/30 1035) Pulse Rate:  [78-98] 78 (03/30 1035) Resp:  [17-34] 18 (03/30 1035) BP: (95-126)/(44-91) 118/79 mmHg (03/30 1035) SpO2:  [89 %-94 %] 91 % (03/30 1035) Weight:  [87.3 kg (192 lb 7.4 oz)] 87.3 kg (192 lb 7.4 oz) (03/30 0600)  Intake/Output from previous day: 03/29 0701 - 03/30 0700 In: 359.4 [I.V.:159.4; IV Piggyback:200] Out: 600 [Urine:600] Intake/Output this shift: Total I/O In: 330 [P.O.:180; I.V.:150] Out: 250 [Urine:250] Nutritional status: Diet NPO time specified Except for: Sips with Meds Diet Heart Room service appropriate?: Yes; Fluid consistency:: Thin  Neurologic Exam: Mental Status: Alert, oriented, thought content appropriate.  Speech aphasic.  Able to follow commands without difficulty. Cranial Nerves: II: Discs flat bilaterally; Visual fields grossly normal, pupils equal, round, reactive to light and accommodation III,IV, VI: ptosis not present, extra-ocular motions intact bilaterally V,VII: smile symmetric, facial light touch sensation normal bilaterally VIII: hearing normal bilaterally IX,X: gag reflex present XI: bilateral shoulder shrug XII: midline tongue extension Motor: Right : Upper extremity   5/5    Left:     Upper extremity   5/5  Lower extremity   5/5     Lower extremity   5/5 Tone and bulk:normal tone throughout; no atrophy noted Sensory: Pinprick and light touch intact throughout, bilaterally Deep Tendon Reflexes: 2+ and symmetric throughout Plantars: Right: downgoing   Left: downgoing   Lab Results: Basic Metabolic  Panel:  Recent Labs Lab 05/13/14 0953 05/13/14 1303 05/14/14 0016 05/14/14 0244  NA 138 133*  --  135  K 3.2* 3.6  --  3.5  CL 102 107  --  109  CO2 13* 18*  --  19  GLUCOSE 272* 242*  --  138*  BUN 10 10  --  11  CREATININE 1.23 0.96  --  0.87  CALCIUM 9.7 7.8*  --  8.4  MG  --  2.4  --  2.6*  PHOS  --  <1.0* 2.9 2.8    Liver Function Tests:  Recent Labs Lab 05/13/14 0953 05/13/14 1303  AST 99* 77*  ALT 52 44  ALKPHOS 177* 119*  BILITOT 0.7 0.6  PROT 7.9 6.2  ALBUMIN 3.9 3.2*   No results for input(s): LIPASE, AMYLASE in the last 168 hours. No results for input(s): AMMONIA in the last 168 hours.  CBC:  Recent Labs Lab 05/13/14 0953 05/13/14 1303 05/14/14 0244  WBC 11.8* 11.2* 8.0  NEUTROABS  --  9.7*  --   HGB 17.8* 15.5 14.9  HCT 52.9* 43.6 42.8  MCV 95.5 89.3 91.1  PLT 223 200 206    Cardiac Enzymes: No results for input(s): CKTOTAL, CKMB, CKMBINDEX, TROPONINI in the last 168 hours.  Lipid Panel:  Recent Labs Lab 05/13/14 1303  TRIG 237*    CBG:  Recent Labs Lab 05/14/14 1954 05/14/14 2322 05/15/14 0328 05/15/14 0800 05/15/14 1151  GLUCAP 118* 168* 119* 141* 158*    Microbiology: Results for orders placed or performed during the hospital encounter of 05/13/14  MRSA PCR Screening     Status: None   Collection Time: 05/13/14  2:09 PM  Result Value Ref Range Status   MRSA by PCR NEGATIVE NEGATIVE Final    Comment:        The GeneXpert MRSA Assay (FDA approved for NASAL specimens only), is one component of a comprehensive MRSA colonization surveillance program. It is not intended to diagnose MRSA infection nor to guide or monitor treatment for MRSA infections.     Coagulation Studies:  Recent Labs  05/13/14 0953 05/13/14 1303  LABPROT 15.5* 15.1  INR 1.22 1.18    Imaging: Dg Chest Port 1 View  05/14/2014   CLINICAL DATA:  Acute respiratory failure, metastatic malignancy, acute encephalopathy.  EXAM: PORTABLE CHEST  - 1 VIEW  COMPARISON:  None.  FINDINGS: The trachea is intubated. The tip of the tube lies 4.2 cm above the crotch of the carina. The esophagogastric tube tip projects below the inferior margin of the image. The lungs are reasonably well inflated. There is a small amount of fluid in the minor fissure. The lung markings are coarse in the left infrahilar region. There is soft tissue fullness in the right hilar region. There is mild prominence of the right paratracheal region.  IMPRESSION: Appropriate positioning of the endotracheal and esophagogastric tubes. There is hilar prominence on the right consistent with an underlying mass. There is increased density in the left infrahilar region consistent with atelectasis or pneumonia.   Electronically Signed   By: Daymeon  [place]   On: 05/14/2014 07:41    Medications:  I have reviewed the patient's current medications. Scheduled: . dexamethasone  4 mg Intravenous 4 times per day  . insulin aspart  0-20 Units Subcutaneous TID WC  . insulin aspart  0-5 Units Subcutaneous QHS  . levETIRAcetam  500 mg Oral BID  . metoprolol tartrate  25 mg Oral BID    Assessment/Plan: Patient has had no further seizures.  On Keppra and Decadron.  Neurological examination only significant for aphasia at this time.  Patient scheduled for liver biopsy for tissue identification of primary.  Will need to determine from records if patient can have an MRI of the brain.    Recommendations: 1.  Continue Keppra and Decadron 2.  Continue seizure precautions 3.  Ativan prn as need for breakthrough seizure activity.      LOS: 2 days   Alexis Goodell, MD [facility] [phone] 05/15/2014  2:24 PM
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
## 2014-05-16 ENCOUNTER — Inpatient Hospital Stay (HOSPITAL_COMMUNITY): Payer: Medicaid Other

## 2014-05-16 LAB — URINALYSIS, ROUTINE W REFLEX MICROSCOPIC
Bilirubin Urine: NEGATIVE
Glucose, UA: NEGATIVE mg/dL
Hgb urine dipstick: NEGATIVE
Ketones, ur: 15 mg/dL — AB
LEUKOCYTES UA: NEGATIVE
NITRITE: NEGATIVE
PROTEIN: NEGATIVE mg/dL
Specific Gravity, Urine: 1.025 (ref 1.005–1.030)
Urobilinogen, UA: 1 mg/dL (ref 0.0–1.0)
pH: 6.5 (ref 5.0–8.0)

## 2014-05-16 LAB — COMPREHENSIVE METABOLIC PANEL
ALT: 37 U/L (ref 0–53)
AST: 49 U/L — AB (ref 0–37)
Albumin: 3.1 g/dL — ABNORMAL LOW (ref 3.5–5.2)
Alkaline Phosphatase: 99 U/L (ref 39–117)
Anion gap: 8 (ref 5–15)
BILIRUBIN TOTAL: 0.8 mg/dL (ref 0.3–1.2)
BUN: 11 mg/dL (ref 6–23)
CHLORIDE: 105 mmol/L (ref 96–112)
CO2: 24 mmol/L (ref 19–32)
CREATININE: 0.64 mg/dL (ref 0.50–1.35)
Calcium: 8.6 mg/dL (ref 8.4–10.5)
GFR calc Af Amer: >90 mL/min (ref >90)
GLUCOSE: 130 mg/dL — AB (ref 70–99)
Potassium: 4 mmol/L (ref 3.5–5.1)
Sodium: 137 mmol/L (ref 135–145)
Total Protein: 6.5 g/dL (ref 6.0–8.3)

## 2014-05-16 LAB — GLUCOSE, CAPILLARY
GLUCOSE-CAPILLARY: 141 mg/dL — AB (ref 70–99)
GLUCOSE-CAPILLARY: 128 mg/dL — AB (ref 70–99)
Glucose-Capillary: 107 mg/dL — ABNORMAL HIGH (ref 70–99)
Glucose-Capillary: 169 mg/dL — ABNORMAL HIGH (ref 70–99)

## 2014-05-16 LAB — CBC
HCT: 43 % (ref 39.0–52.0)
HEMOGLOBIN: 13.8 g/dL (ref 13.0–17.0)
MCH: 30.3 pg (ref 26.0–34.0)
MCHC: 32.1 g/dL (ref 30.0–36.0)
MCV: 94.3 fL (ref 78.0–100.0)
Platelets: 222 10*3/uL (ref 150–400)
RBC: 4.56 MIL/uL (ref 4.22–5.81)
RDW: 12.7 % (ref 11.5–15.5)
WBC: 14.8 10*3/uL — ABNORMAL HIGH (ref 4.0–10.5)

## 2014-05-16 LAB — PROTIME-INR
INR: 1.04 (ref 0.00–1.49)
Prothrombin Time: 13.7 seconds (ref 11.6–15.2)

## 2014-05-16 LAB — APTT: aPTT: 27 seconds (ref 24–37)

## 2014-05-16 MED ORDER — LIDOCAINE HCL (PF) 1 % IJ SOLN
INTRAMUSCULAR | Status: AC
Start: 2014-05-16 — End: 2014-05-17
  Filled 2014-05-16: qty 10

## 2014-05-16 MED ORDER — MIDAZOLAM HCL 2 MG/2ML IJ SOLN
INTRAMUSCULAR | Status: AC
  Filled 2014-05-16: qty 2

## 2014-05-16 MED ORDER — LORAZEPAM 2 MG/ML IJ SOLN
0.0000 mg | Freq: Two times a day (BID) | INTRAMUSCULAR | Status: DC
  Filled 2014-05-16: qty 1

## 2014-05-16 MED ORDER — FOLIC ACID 1 MG PO TABS
1.0000 mg | ORAL_TABLET | Freq: Every day | ORAL | Status: DC
  Administered 2014-05-16 – 2014-05-22 (×7): 1 mg via ORAL
  Filled 2014-05-16 (×7): qty 1

## 2014-05-16 MED ORDER — FENTANYL CITRATE 0.05 MG/ML IJ SOLN
INTRAMUSCULAR | Status: AC
  Filled 2014-05-16: qty 2

## 2014-05-16 MED ORDER — THIAMINE HCL 100 MG/ML IJ SOLN
100.0000 mg | Freq: Every day | INTRAMUSCULAR | Status: DC
Start: 2014-05-16 — End: 2014-05-19
  Filled 2014-05-16 (×4): qty 1

## 2014-05-16 MED ORDER — LORAZEPAM 2 MG/ML IJ SOLN
1.0000 mg | Freq: Four times a day (QID) | INTRAMUSCULAR | Status: AC | PRN
  Administered 2014-05-17: 1 mg via INTRAVENOUS
  Filled 2014-05-16: qty 1

## 2014-05-16 MED ORDER — ADULT MULTIVITAMIN W/MINERALS CH
1.0000 | ORAL_TABLET | Freq: Every day | ORAL | Status: DC
  Administered 2014-05-16 – 2014-05-22 (×7): 1 via ORAL
  Filled 2014-05-16 (×7): qty 1

## 2014-05-16 MED ORDER — LORAZEPAM 2 MG/ML IJ SOLN: 0.0000 mg | Freq: Four times a day (QID) | INTRAMUSCULAR | Status: AC

## 2014-05-16 MED ORDER — LIDOCAINE HCL (PF) 1 % IJ SOLN
INTRAMUSCULAR | Status: AC
  Filled 2014-05-16: qty 10

## 2014-05-16 MED ORDER — MIDAZOLAM HCL 2 MG/2ML IJ SOLN
INTRAMUSCULAR | Status: AC | PRN
  Administered 2014-05-16: 0.5 mg via INTRAVENOUS
  Administered 2014-05-16 (×2): 1 mg via INTRAVENOUS

## 2014-05-16 MED ORDER — CHLORDIAZEPOXIDE HCL 5 MG PO CAPS
10.0000 mg | ORAL_CAPSULE | Freq: Three times a day (TID) | ORAL | Status: DC
  Administered 2014-05-16 – 2014-05-18 (×6): 10 mg via ORAL
  Filled 2014-05-16 (×6): qty 2

## 2014-05-16 MED ORDER — WHITE PETROLATUM GEL
Status: AC
  Administered 2014-05-16: 0.2
  Filled 2014-05-16: qty 1

## 2014-05-16 MED ORDER — FENTANYL CITRATE 0.05 MG/ML IJ SOLN
INTRAMUSCULAR | Status: AC | PRN
  Administered 2014-05-16 (×2): 50 ug via INTRAVENOUS

## 2014-05-16 MED ORDER — BISACODYL 10 MG RE SUPP
10.0000 mg | Freq: Every day | RECTAL | Status: DC
  Administered 2014-05-16 – 2014-05-17 (×2): 10 mg via RECTAL
  Filled 2014-05-16 (×5): qty 1

## 2014-05-16 MED ORDER — LORAZEPAM 1 MG PO TABS: 1.0000 mg | ORAL_TABLET | Freq: Four times a day (QID) | ORAL | Status: AC | PRN

## 2014-05-16 MED ORDER — VITAMIN B-1 100 MG PO TABS
100.0000 mg | ORAL_TABLET | Freq: Every day | ORAL | Status: DC
  Administered 2014-05-16 – 2014-05-22 (×7): 100 mg via ORAL
  Filled 2014-05-16 (×7): qty 1

## 2014-05-16 NOTE — Sedation Documentation (Signed)
Patient denies pain and is resting comfortably.  

## 2014-05-16 NOTE — Progress Notes (Signed)
Patient ID: [name], male   DOB: [birth year], 43 y.o.   MRN: [id number] Stable, no more seizures. On kepra. Cancer w/u in progress

## 2014-05-16 NOTE — Procedures (Signed)
US guided FNAs of a central right hepatic lesion.  Core samples could not be obtained due to location and surrounding structures.  Minimal blood loss.  No immediate complication.
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
## 2014-05-16 NOTE — Progress Notes (Signed)
PT Cancellation Note  Patient Details Name: [name] MRN: [id number] DOB: 1970-10-02   Cancelled Treatment:    Reason Eval/Treat Not Completed: Patient at procedure or test/unavailable.  Gone this afternoon for Liver bx. 05/16/2014  [name], [name] [phone]  (pager)   [name], [name] 05/16/2014, 5:46 PM
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
## 2014-05-16 NOTE — Progress Notes (Signed)
Rehab admissions - Evaluated for possible admission.  I met with patient and his sister.  Sister is interested in patient coming to inpatient rehab.  Once medically stable, will plan for acute inpatient rehab admission.  Call me for questions.  #[phone]

## 2014-05-17 ENCOUNTER — Inpatient Hospital Stay (HOSPITAL_COMMUNITY): Payer: Medicaid Other

## 2014-05-17 ENCOUNTER — Encounter (HOSPITAL_COMMUNITY): Payer: Self-pay | Admitting: Radiology

## 2014-05-17 DIAGNOSIS — R160 Hepatomegaly, not elsewhere classified: Secondary | ICD-10-CM

## 2014-05-17 DIAGNOSIS — S129XXA Fracture of neck, unspecified, initial encounter: Secondary | ICD-10-CM

## 2014-05-17 DIAGNOSIS — R918 Other nonspecific abnormal finding of lung field: Secondary | ICD-10-CM | POA: Insufficient documentation

## 2014-05-17 DIAGNOSIS — G939 Disorder of brain, unspecified: Secondary | ICD-10-CM

## 2014-05-17 DIAGNOSIS — Z720 Tobacco use: Secondary | ICD-10-CM

## 2014-05-17 DIAGNOSIS — K769 Liver disease, unspecified: Secondary | ICD-10-CM

## 2014-05-17 DIAGNOSIS — J939 Pneumothorax, unspecified: Secondary | ICD-10-CM

## 2014-05-17 DIAGNOSIS — K639 Disease of intestine, unspecified: Secondary | ICD-10-CM

## 2014-05-17 DIAGNOSIS — J984 Other disorders of lung: Secondary | ICD-10-CM

## 2014-05-17 DIAGNOSIS — F1020 Alcohol dependence, uncomplicated: Secondary | ICD-10-CM

## 2014-05-17 LAB — BASIC METABOLIC PANEL
Anion gap: 9 (ref 5–15)
BUN: 16 mg/dL (ref 6–23)
CO2: 25 mmol/L (ref 19–32)
CREATININE: 0.67 mg/dL (ref 0.50–1.35)
Calcium: 9 mg/dL (ref 8.4–10.5)
Chloride: 100 mmol/L (ref 96–112)
GFR calc Af Amer: >90 mL/min (ref >90)
GFR calc non Af Amer: >90 mL/min (ref >90)
Glucose, Bld: 121 mg/dL — ABNORMAL HIGH (ref 70–99)
POTASSIUM: 3.8 mmol/L (ref 3.5–5.1)
Sodium: 134 mmol/L — ABNORMAL LOW (ref 135–145)

## 2014-05-17 LAB — CBC
HCT: 45.7 % (ref 39.0–52.0)
Hemoglobin: 15.9 g/dL (ref 13.0–17.0)
MCH: 32 pg (ref 26.0–34.0)
MCHC: 34.8 g/dL (ref 30.0–36.0)
MCV: 92 fL (ref 78.0–100.0)
Platelets: 260 10*3/uL (ref 150–400)
RBC: 4.97 MIL/uL (ref 4.22–5.81)
RDW: 12.3 % (ref 11.5–15.5)
WBC: 13.5 10*3/uL — ABNORMAL HIGH (ref 4.0–10.5)

## 2014-05-17 LAB — GLUCOSE, CAPILLARY
GLUCOSE-CAPILLARY: 137 mg/dL — AB (ref 70–99)
Glucose-Capillary: 136 mg/dL — ABNORMAL HIGH (ref 70–99)
Glucose-Capillary: 98 mg/dL (ref 70–99)

## 2014-05-17 MED ORDER — MIDAZOLAM HCL 2 MG/2ML IJ SOLN
INTRAMUSCULAR | Status: AC
  Filled 2014-05-17: qty 2

## 2014-05-17 MED ORDER — LIDOCAINE HCL 1 % IJ SOLN
INTRAMUSCULAR | Status: AC
  Filled 2014-05-17: qty 20

## 2014-05-17 MED ORDER — FENTANYL CITRATE 0.05 MG/ML IJ SOLN
INTRAMUSCULAR | Status: AC | PRN
  Administered 2014-05-17: 50 ug via INTRAVENOUS

## 2014-05-17 MED ORDER — FENTANYL CITRATE 0.05 MG/ML IJ SOLN
INTRAMUSCULAR | Status: AC
  Filled 2014-05-17: qty 4

## 2014-05-17 MED ORDER — CLONIDINE HCL 0.1 MG PO TABS
0.1000 mg | ORAL_TABLET | Freq: Four times a day (QID) | ORAL | Status: DC | PRN
  Administered 2014-05-17: 0.1 mg via ORAL
  Filled 2014-05-17 (×3): qty 1

## 2014-05-17 MED ORDER — GADOBENATE DIMEGLUMINE 529 MG/ML IV SOLN
15.0000 mL | Freq: Once | INTRAVENOUS | Status: AC | PRN
  Administered 2014-05-17: 15 mL via INTRAVENOUS

## 2014-05-17 MED ORDER — DIPHENHYDRAMINE HCL 25 MG PO CAPS
25.0000 mg | ORAL_CAPSULE | Freq: Once | ORAL | Status: AC
  Administered 2014-05-17: 25 mg via ORAL
  Filled 2014-05-17: qty 1

## 2014-05-17 MED ORDER — CLONIDINE HCL 0.2 MG PO TABS
0.2000 mg | ORAL_TABLET | Freq: Three times a day (TID) | ORAL | Status: DC
  Administered 2014-05-17 – 2014-05-24 (×20): 0.2 mg via ORAL
  Filled 2014-05-17 (×25): qty 1

## 2014-05-17 NOTE — Progress Notes (Signed)
Patient ID: [name], male   DOB: [birth year], 44 y.o.   MRN: [id number]   US guided FNA x 4 of liver lesion performed 3/31 Now request for Lung mass  Core samples not obtained of liver lesion secondary to risk of surrounding structures  I have spoken to Dr Barbie Banner this am Will discuss with pathology-- If FNAs are diagnostic - will NOT perform Lung mass bx  If pathology feels diagnosis still unable to be made with liver FNAs- Dr Barbie Banner has okayed moving forward with Lung mass biopsy today.  NPO now

## 2014-05-17 NOTE — Sedation Documentation (Signed)
Patient denies pain and is resting comfortably.  
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
## 2014-05-17 NOTE — Progress Notes (Signed)
Patient ID: [name], male   DOB: 1970-07-21, 44 y.o.   MRN: [id number] Spoke with mr Dorgan and his fiancee. Aware of the presence of brain tumor along with chest. We will wait for lung biopsy

## 2014-05-17 NOTE — Progress Notes (Addendum)
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 markings are coarse in the left infrahilar region. There is soft tissue fullness in the right hilar region. There is mild prominence of the right paratracheal region.  IMPRESSION: Appropriate positioning of the endotracheal and esophagogastric tubes. There is hilar prominence on the right consistent with an underlying mass. There is increased density in the left infrahilar region consistent with atelectasis or pneumonia.   Electronically Signed   By: Kyrin  [place]   On: 05/14/2014 07:41   Dg Chest Port 1 View  05/13/2014   CLINICAL DATA:  Trauma.  Endotracheal tube  EXAM: PORTABLE CHEST - 1 VIEW  COMPARISON:  None.  FINDINGS: Endotracheal tube is in good position, tip between the clavicular heads and carina.  Low lung volumes with dependent atelectasis.  There is a right hilar mass and mediastinal widening. No cardiomegaly.  IMPRESSION: 1. Endotracheal tube is in good position. 2. Low lung volumes with atelectasis. 3. 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   Electronically Signed   By: Monte Fantasia M.D.   On: 05/13/2014 10:30     CBC  Recent Labs Lab 05/13/14 0953 05/13/14 1303 05/14/14 0244 05/16/14 0555 05/17/14 0636  WBC 11.8* 11.2* 8.0 14.8* 13.5*  HGB 17.8* 15.5 14.9 13.8 15.9  HCT 52.9* 43.6 42.8 43.0 45.7  PLT 223 200 206 222 260  MCV 95.5 89.3 91.1 94.3 92.0  MCH 32.1 31.8 31.7 30.3 32.0  MCHC 33.6 35.6 34.8 32.1 34.8  RDW 12.3 12.1 12.3 12.7 12.3  LYMPHSABS  --  0.5*  --   --   --   MONOABS  --  1.0  --   --   --   EOSABS  --  0.0  --   --   --   BASOSABS  --  0.0  --   --   --     Chemistries   Recent Labs Lab 05/13/14 0953 05/13/14 1303 05/14/14 0244 05/16/14 0555 05/17/14 0636  NA 138 133* 135 137 134*  K 3.2* 3.6 3.5 4.0 3.8  CL 102 107 109 105 100  CO2 13* 18* 19 24 25   GLUCOSE 272* 242* 138* 130* 121*  BUN 10 10 11 11 16   CREATININE 1.23 0.96 0.87 0.64 0.67  CALCIUM 9.7 7.8* 8.4 8.6 9.0  MG  --  2.4 2.6*  --   --   AST 99* 77*  --  49*  --   ALT 52 44  --  37  --   ALKPHOS 177* 119*  --  99  --   BILITOT 0.7 0.6  --  0.8  --    ------------------------------------------------------------------------------------------------------------------ estimated creatinine clearance is 119.1 mL/min (by C-G formula based on Cr of 0.67). ------------------------------------------------------------------------------------------------------------------ No results for input(s): HGBA1C in the last 72 hours. ------------------------------------------------------------------------------------------------------------------ No results for input(s): CHOL, HDL, LDLCALC, TRIG, CHOLHDL, LDLDIRECT in the last 72 hours. ------------------------------------------------------------------------------------------------------------------ No results for input(s): TSH, T4TOTAL, T3FREE, THYROIDAB in the last 72 hours.  Invalid input(s):  FREET3 ------------------------------------------------------------------------------------------------------------------ No results for input(s): VITAMINB12, FOLATE, FERRITIN, TIBC, IRON, RETICCTPCT in the last 72 hours.  Coagulation profile  Recent Labs Lab 05/13/14 0953 05/13/14 1303 05/16/14 0555  INR 1.22 1.18 1.04    No results for input(s): DDIMER in the last 72 hours.  Cardiac Enzymes No results for input(s): CKMB, TROPONINI, MYOGLOBIN in the last 168 hours.  Invalid input(s): CK ------------------------------------------------------------------------------------------------------------------ Invalid input(s): POCBNP     Time Spent in minutes   35   Omah Dewalt K M.D on 05/17/2014 at 10:38 AM  Between 7am to 7pm - Pager - [phone]  After 7pm go to [email or web address] - password [facility]  [facility]   Office  [phone]

## 2014-05-17 NOTE — Consult Note (Signed)
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 with sister and in chart   Thank you for this interesting consult.  I greatly enjoyed meeting [name] and look forward to participating in their care.  Signed: Gelene Recktenwald A 05/17/2014, 1:39 PM   I spent a total of 20 Minutes  in face to face in clinical consultation, greater than 50% of which was counseling/coordinating care for lung mass bx
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
## 2014-05-17 NOTE — Progress Notes (Signed)
Rehab admissions - I met with pt and his fiancee this am to share that I will be following his case. Further questions were answered about our inpatient rehab unit. I am aware that pt is having further work up for liver and lung masses. Per Dr. Candiss Norse, oncology has also been consulted.   I will check on pt's status on Monday.  Please call me with any questions. Thanks.  Nanetta Batty, PT Rehabilitation Admissions Coordinator [phone]

## 2014-05-17 NOTE — Procedures (Signed)
RUL lung Bx No comp

## 2014-05-18 ENCOUNTER — Inpatient Hospital Stay (HOSPITAL_COMMUNITY): Payer: Medicaid Other

## 2014-05-18 DIAGNOSIS — R918 Other nonspecific abnormal finding of lung field: Secondary | ICD-10-CM

## 2014-05-18 LAB — BASIC METABOLIC PANEL
Anion gap: 5 (ref 5–15)
BUN: 18 mg/dL (ref 6–23)
CHLORIDE: 99 mmol/L (ref 96–112)
CO2: 29 mmol/L (ref 19–32)
CREATININE: 0.73 mg/dL (ref 0.50–1.35)
Calcium: 8.7 mg/dL (ref 8.4–10.5)
GLUCOSE: 141 mg/dL — AB (ref 70–99)
POTASSIUM: 3.9 mmol/L (ref 3.5–5.1)
Sodium: 133 mmol/L — ABNORMAL LOW (ref 135–145)

## 2014-05-18 LAB — CBC
HCT: 45.7 % (ref 39.0–52.0)
Hemoglobin: 15.5 g/dL (ref 13.0–17.0)
MCH: 31.2 pg (ref 26.0–34.0)
MCHC: 33.9 g/dL (ref 30.0–36.0)
MCV: 92 fL (ref 78.0–100.0)
PLATELETS: 257 10*3/uL (ref 150–400)
RBC: 4.97 MIL/uL (ref 4.22–5.81)
RDW: 12.4 % (ref 11.5–15.5)
WBC: 12.3 10*3/uL — ABNORMAL HIGH (ref 4.0–10.5)

## 2014-05-18 LAB — URINE CULTURE

## 2014-05-18 LAB — GLUCOSE, CAPILLARY
GLUCOSE-CAPILLARY: 129 mg/dL — AB (ref 70–99)
GLUCOSE-CAPILLARY: 184 mg/dL — AB (ref 70–99)
Glucose-Capillary: 189 mg/dL — ABNORMAL HIGH (ref 70–99)
Glucose-Capillary: 148 mg/dL — ABNORMAL HIGH (ref 70–99)

## 2014-05-18 MED ORDER — DIPHENHYDRAMINE HCL 25 MG PO CAPS
25.0000 mg | ORAL_CAPSULE | Freq: Four times a day (QID) | ORAL | Status: DC | PRN
  Administered 2014-05-19 – 2014-05-23 (×6): 25 mg via ORAL
  Filled 2014-05-18 (×7): qty 1

## 2014-05-18 MED ORDER — CHLORDIAZEPOXIDE HCL 5 MG PO CAPS
5.0000 mg | ORAL_CAPSULE | Freq: Three times a day (TID) | ORAL | Status: DC
  Administered 2014-05-18 – 2014-05-20 (×6): 5 mg via ORAL
  Filled 2014-05-18 (×6): qty 1

## 2014-05-18 MED ORDER — MAGNESIUM HYDROXIDE 400 MG/5ML PO SUSP
30.0000 mL | Freq: Once | ORAL | Status: AC
  Administered 2014-05-18: 30 mL via ORAL
  Filled 2014-05-18: qty 30
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
## 2014-05-18 NOTE — Progress Notes (Signed)
Patient ID: [name], male   DOB: September 15, 1970, 44 y.o.   MRN: [id number] Stable. Will follow prn
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
## 2014-05-19 LAB — GLUCOSE, CAPILLARY
GLUCOSE-CAPILLARY: 120 mg/dL — AB (ref 70–99)
GLUCOSE-CAPILLARY: 122 mg/dL — AB (ref 70–99)
Glucose-Capillary: 161 mg/dL — ABNORMAL HIGH (ref 70–99)
Glucose-Capillary: 214 mg/dL — ABNORMAL HIGH (ref 70–99)

## 2014-05-19 MED ORDER — ALUM & MAG HYDROXIDE-SIMETH 200-200-20 MG/5ML PO SUSP
30.0000 mL | Freq: Once | ORAL | Status: AC
  Administered 2014-05-20: 30 mL via ORAL
  Filled 2014-05-19: qty 30
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
## 2014-05-19 NOTE — Progress Notes (Addendum)
Patient initially requested PO 1mg  Dilaudid. When RN brought med in, patient refused and requested Dilaudid IV. Education provided and pt still refused. Wasted 1mg  dilaudid PO with Priscella Mann RN in sink

## 2014-05-20 LAB — GLUCOSE, CAPILLARY
GLUCOSE-CAPILLARY: 165 mg/dL — AB (ref 70–99)
Glucose-Capillary: 192 mg/dL — ABNORMAL HIGH (ref 70–99)
Glucose-Capillary: 112 mg/dL — ABNORMAL HIGH (ref 70–99)
Glucose-Capillary: 116 mg/dL — ABNORMAL HIGH (ref 70–99)

## 2014-05-20 MED ORDER — ALUM & MAG HYDROXIDE-SIMETH 200-200-20 MG/5ML PO SUSP
30.0000 mL | Freq: Once | ORAL | Status: AC
  Administered 2014-05-20: 30 mL via ORAL
  Filled 2014-05-20: qty 30

## 2014-05-20 MED ORDER — ENOXAPARIN SODIUM 40 MG/0.4ML ~~LOC~~ SOLN
40.0000 mg | SUBCUTANEOUS | Status: DC
  Administered 2014-05-20 – 2014-05-23 (×4): 40 mg via SUBCUTANEOUS
  Filled 2014-05-20 (×6): qty 0.4

## 2014-05-20 MED ORDER — CHLORDIAZEPOXIDE HCL 5 MG PO CAPS
5.0000 mg | ORAL_CAPSULE | Freq: Two times a day (BID) | ORAL | Status: DC
  Administered 2014-05-20 – 2014-05-21 (×2): 5 mg via ORAL
  Filled 2014-05-20 (×2): qty 1
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
## 2014-05-20 NOTE — Progress Notes (Signed)
Rehab admissions - I am following pt's case.   Per oncology note from today: "Appreciate neurosurgery consultation to see whether resection of brain masses are reasonable. At this time he is still on observation, no resection decisions have been made in view of recent trauma If not, would recommend transfer to [facility] to facilitate radiation oncology consultation and initiation of XRT. After that, he would benefit from out-patient PET scan and start of systemic chemotherapy. In the meantime, recommend IV dexamethasone and seizure prophylaxis to continue.   I will return on Wednesday afternoon to discuss final treatment plans with him."  I will continue to follow pt's case and see what the overall medical plan will be.  Thank you.  Nanetta Batty, PT Rehabilitation Admissions Coordinator [phone]
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
## 2014-05-20 NOTE — Progress Notes (Signed)
Patient ID: [name], male   DOB: [birth year], 44 y.o.   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 Lung biopsy done today. If the parties involved in his care feel that surgery to remove his two lesion will improve his outcome i will be more than glad to schedule him for this week

## 2014-05-21 ENCOUNTER — Encounter: Payer: Self-pay | Admitting: Hematology and Oncology

## 2014-05-21 ENCOUNTER — Ambulatory Visit
Admit: 2014-05-21 | Discharge: 2014-05-21 | Disposition: A | Discharge status: Home / Self Care | Payer: MEDICAID | Attending: Radiation Oncology | Admitting: Radiation Oncology

## 2014-05-21 DIAGNOSIS — C3491 Malignant neoplasm of unspecified part of right bronchus or lung: Secondary | ICD-10-CM

## 2014-05-21 LAB — BASIC METABOLIC PANEL WITH GFR
Anion gap: 7 (ref 5–15)
BUN: 19 mg/dL (ref 6–23)
CO2: 29 mmol/L (ref 19–32)
Calcium: 8.8 mg/dL (ref 8.4–10.5)
Chloride: 97 mmol/L (ref 96–112)
Creatinine, Ser: 0.74 mg/dL (ref 0.50–1.35)
GFR calc Af Amer: >90 mL/min
GFR calc non Af Amer: >90 mL/min
Glucose, Bld: 106 mg/dL — ABNORMAL HIGH (ref 70–99)
Potassium: 4.3 mmol/L (ref 3.5–5.1)
Sodium: 133 mmol/L — ABNORMAL LOW (ref 135–145)

## 2014-05-21 LAB — GLUCOSE, CAPILLARY
GLUCOSE-CAPILLARY: 133 mg/dL — AB (ref 70–99)
GLUCOSE-CAPILLARY: 154 mg/dL — AB (ref 70–99)
GLUCOSE-CAPILLARY: 180 mg/dL — AB (ref 70–99)
Glucose-Capillary: 94 mg/dL (ref 70–99)

## 2014-05-21 LAB — CBC
HCT: 46.2 % (ref 39.0–52.0)
Hemoglobin: 15.8 g/dL (ref 13.0–17.0)
MCH: 31.7 pg (ref 26.0–34.0)
MCHC: 34.2 g/dL (ref 30.0–36.0)
MCV: 92.8 fL (ref 78.0–100.0)
Platelets: 347 K/uL (ref 150–400)
RBC: 4.98 MIL/uL (ref 4.22–5.81)
RDW: 12.4 % (ref 11.5–15.5)
WBC: 15.5 K/uL — ABNORMAL HIGH (ref 4.0–10.5)

## 2014-05-21 MED ORDER — OXYCODONE HCL 5 MG PO TABS
15.0000 mg | ORAL_TABLET | ORAL | Status: DC | PRN
  Administered 2014-05-21 – 2014-05-24 (×18): 15 mg via ORAL
  Filled 2014-05-21 (×18): qty 3

## 2014-05-21 MED ORDER — CHLORDIAZEPOXIDE HCL 5 MG PO CAPS
5.0000 mg | ORAL_CAPSULE | Freq: Every day | ORAL | Status: AC
  Administered 2014-05-22: 5 mg via ORAL
  Filled 2014-05-21: qty 1

## 2014-05-21 MED ORDER — POLYETHYLENE GLYCOL 3350 17 G PO PACK
17.0000 g | PACK | Freq: Every day | ORAL | Status: DC
  Administered 2014-05-21 – 2014-05-24 (×2): 17 g via ORAL
  Filled 2014-05-21 (×4): qty 1

## 2014-05-21 MED ORDER — SENNOSIDES-DOCUSATE SODIUM 8.6-50 MG PO TABS
2.0000 | ORAL_TABLET | Freq: Two times a day (BID) | ORAL | Status: DC
Start: 2014-05-21 — End: 2014-05-24
  Administered 2014-05-21 – 2014-05-22 (×4): 2 via ORAL
  Filled 2014-05-21 (×8): qty 2

## 2014-05-21 MED ORDER — CHLORDIAZEPOXIDE HCL 5 MG PO CAPS: 5.0000 mg | ORAL_CAPSULE | Freq: Every day | ORAL | Status: DC
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
## 2014-05-21 NOTE — Progress Notes (Signed)
Patient ID: [name], male   DOB: Dec 10, 1970, 44 y.o.   MRN: [id number] Stable, speech getting better. Spoke with him and fiancee. To wait for biopsy results

## 2014-05-21 NOTE — Progress Notes (Signed)
Rehab admissions - I met with pt today to share that we continue to follow his case. Per pt, they are considering possible brain surgery later this week. I will continue to follow his progress/medical plan from a distance.   I updated Edwin Cap, case Freight forwarder and Marshell Levan, Education officer, museum as well.  Thanks.  Nanetta Batty, PT Rehabilitation Admissions Coordinator [phone]'
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
## 2014-05-22 ENCOUNTER — Telehealth: Payer: Self-pay | Admitting: Hematology and Oncology

## 2014-05-22 DIAGNOSIS — Z9981 Dependence on supplemental oxygen: Secondary | ICD-10-CM

## 2014-05-22 DIAGNOSIS — C7931 Secondary malignant neoplasm of brain: Secondary | ICD-10-CM

## 2014-05-22 DIAGNOSIS — C3490 Malignant neoplasm of unspecified part of unspecified bronchus or lung: Secondary | ICD-10-CM

## 2014-05-22 LAB — GLUCOSE, CAPILLARY
GLUCOSE-CAPILLARY: 118 mg/dL — AB (ref 70–99)
Glucose-Capillary: 148 mg/dL — ABNORMAL HIGH (ref 70–99)
Glucose-Capillary: 114 mg/dL — ABNORMAL HIGH (ref 70–99)
Glucose-Capillary: 152 mg/dL — ABNORMAL HIGH (ref 70–99)

## 2014-05-22 LAB — CANCER ANTIGEN 19-9: CA 19 9: 18 U/mL (ref 0–35)
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
## 2014-05-22 NOTE — Telephone Encounter (Signed)
new patient appt-pt scheduled to see Dr. Elson Areas 4/25 @ 11. d/t per md patient is aware.
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
## 2014-05-22 NOTE — Progress Notes (Signed)
Pt to transfer to [facility] by [facility] . EMS currently at bedside to pick pt up. All belongings sent with pt.

## 2014-05-22 NOTE — Progress Notes (Signed)
[place]  [phone]   I have seen the patient, examined him and edited the notes as follows   HOSPITAL PROGRESS  NOTE Events since 4/4 noted. Alert, anxious this morning. No confusion or further seizures reported. Speech improving. No respiratory or cardiac complaints. Denies nausea, vomiting or diarrhea. Ambulating without difficulty.  MEDICATIONS: Scheduled Meds:  bisacodyl  10 mg Rectal Daily   chlordiazePOXIDE  5 mg Oral Daily   cloNIDine  0.2 mg Oral TID   dexamethasone  4 mg Intravenous 4 times per day   enoxaparin (LOVENOX) injection  40 mg Subcutaneous A19F   folic acid  1 mg Oral Daily   insulin aspart  0-20 Units Subcutaneous TID WC   insulin aspart  0-5 Units Subcutaneous QHS   levETIRAcetam  500 mg Oral BID   metoprolol tartrate  25 mg Oral BID   multivitamin with minerals  1 tablet Oral Daily   polyethylene glycol  17 g Oral Daily   senna-docusate  2 tablet Oral BID   thiamine  100 mg Oral Daily   Continuous Infusions:  PRN Meds:.acetaminophen, diphenhydrAMINE, HYDROmorphone **OR** HYDROmorphone (DILAUDID) injection, oxyCODONE   ALLERGIES:  No Known Allergies   PHYSICAL EXAMINATION:  Filed Vitals:   05/22/14 0610  BP: 152/84  Pulse: 59  Temp: 97.7 F (36.5 C)  Resp: 18   Filed Weights   05/19/14 1957 05/21/14 0549 05/22/14 0500  Weight: 186 lb 3.2 oz (84.46 kg) 183 lb 3.2 oz (83.099 kg) 182 lb 4.8 oz (82.691 kg)    GENERAL:alert, no distress and comfortable SKIN: skin color, texture, turgor are normal, no rashes or significant lesions EYES: normal, conjunctiva are pink and non-injected, sclera clear OROPHARYNX:no exudate, no erythema and lips, buccal mucosa, and tongue normal  NECK: supple, thyroid normal size, non-tender, without nodularity. C-collar in situ LYMPH:  no palpable lymphadenopathy in the cervical, axillary or inguinal LUNGS: clear to auscultation and percussion with normal breathing  effort HEART: regular rate & rhythm and no murmurs and no lower extremity edema ABDOMEN:abdomen soft, non-tender and normal bowel sounds Musculoskeletal:no cyanosis of digits and no clubbing  PSYCH: alert & oriented x 3 with fluent speech, anxious NEURO: no focal motor/sensory deficits   LABORATORY/RADIOLOGY DATA:   Recent Labs Lab 05/16/14 0555 05/17/14 0636 05/18/14 0625 05/21/14 0656  WBC 14.8* 13.5* 12.3* 15.5*  HGB 13.8 15.9 15.5 15.8  HCT 43.0 45.7 45.7 46.2  PLT 222 260 257 347  MCV 94.3 92.0 92.0 92.8  MCH 30.3 32.0 31.2 31.7  MCHC 32.1 34.8 33.9 34.2  RDW 12.7 12.3 12.4 12.4    CMP    Recent Labs Lab 05/16/14 0555 05/17/14 0636 05/18/14 0625 05/21/14 0656  NA 137 134* 133* 133*  K 4.0 3.8 3.9 4.3  CL 105 100 99 97  CO2 24 25 29 29   GLUCOSE 130* 121* 141* 106*  BUN 11 16 18 19   CREATININE 0.64 0.67 0.73 0.74  CALCIUM 8.6 9.0 8.7 8.8  AST 49*  --   --   --   ALT 37  --   --   --   ALKPHOS 99  --   --   --   BILITOT 0.8  --   --   --         Component Value Date/Time   BILITOT 0.8 05/16/2014 0555     Recent Labs Lab 05/16/14 0555  INR 1.04      Urinalysis    Component Value  Date/Time   COLORURINE YELLOW 05/16/2014 0857   APPEARANCEUR CLEAR 05/16/2014 0857   LABSPEC 1.025 05/16/2014 0857   PHURINE 6.5 05/16/2014 0857   GLUCOSEU NEGATIVE 05/16/2014 0857   HGBUR NEGATIVE 05/16/2014 0857   BILIRUBINUR NEGATIVE 05/16/2014 0857   KETONESUR 15* 05/16/2014 0857   PROTEINUR NEGATIVE 05/16/2014 0857   UROBILINOGEN 1.0 05/16/2014 0857   NITRITE NEGATIVE 05/16/2014 0857   LEUKOCYTESUR NEGATIVE 05/16/2014 0857    Drugs of Abuse     Component Value Date/Time   LABOPIA NEGATIVE 05/13/2014 1200   COCAINSCRNUR NEGATIVE 05/13/2014 1200   LABBENZ NEGATIVE 05/13/2014 1200   AMPHETMU NEGATIVE 05/13/2014 1200     Liver Function Tests:  Recent Labs Lab 05/16/14 0555  AST 49*  ALT 37  ALKPHOS 99  BILITOT 0.8  PROT 6.5  ALBUMIN 3.1*     CBG:  Recent Labs Lab 05/20/14 2121 05/21/14 0757 05/21/14 1250 05/21/14 1720 05/21/14 2212  GLUCAP 116* 94 133* 154* 180*   Radiology Studies: None today  Patient: [name], [name] Client: [facility] Accession: [id number] Received: [name] DOB: Jun 21, 1970 Age: 44 Gender: M Reported: 05/21/2014 [address] Patient Ph: [phone] MRN #: [id number] [place] [employer], [place] [address] Visit #: [id number] Chart #: Phone:  Fax: CC: Lala Lund, MD Curt Bears, MD REPORT OF SURGICAL PATHOLOGY FINAL DIAGNOSIS Diagnosis Lung, biopsy, RUL - SMALL CELL CARCINOMA.  Microscopic Comment The malignant cells are positive for CD56, chromogranin, and synaptophysin. They are negative for p63 and cytokeratin 5/6. 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 They are negative for p63 and cytokeratin 5/6. Appreciate neurosurgery consultation to see whether resection of brain masses are reasonable. At this time he is still on observation, no resection decisions have been made in view of recent trauma. At this time, recommend transfer to [facility] to facilitate radiation and initiation of XRT. Simulation planned for today by Dr. Sondra Come After that, he would benefit from out-patient PET scan and start of systemic chemotherapy. Continue IV dexamethasone and seizure prophylaxis   Significant alcoholism He is at risk of DT. He is placed on Librium  C spine fracture This is managed conservatively with C collar and pain medications  Tobacco dependency The patient is interested to quit permanently upon discharge  Post-biopsy pneumothorax Follow closely with CXR. On oxygen  DVT prophylaxis On Lovenox  Other medical issues as per primary team I will sign off.  Plan to discuss treatment with patient and family in 2 weeks. I have made him an appointment to see me back on April 25th, 2016.  **Disclaimer: This note was dictated with voice recognition software. Similar sounding words can inadvertently be transcribed and this note may contain transcription errors which may not have been corrected upon publication of note.[name] Butters [name], [name] 05/22/2014, 7:29 AM  [name] Skalsky, MD 05/22/2014

## 2014-05-22 NOTE — Progress Notes (Signed)
Rehab admissions - chart indicates that pt is being transferred to [facility] today. I will follow pt's case from [place].  Thanks.  Nanetta Batty, PT Rehabilitation Admissions Coordinator [phone]'

## 2014-05-22 NOTE — Progress Notes (Signed)
Report called and given to Butch Penny RN at [facility].

## 2014-05-23 ENCOUNTER — Ambulatory Visit
Admit: 2014-05-23 | Discharge: 2014-05-23 | Disposition: A | Discharge status: Home / Self Care | Payer: Medicaid Other | Attending: Radiation Oncology | Admitting: Radiation Oncology

## 2014-05-23 ENCOUNTER — Ambulatory Visit
Admit: 2014-05-23 | Discharge: 2014-05-23 | Disposition: A | Discharge status: Home / Self Care | Payer: Medicaid Other | Source: Ambulatory Visit | Attending: Radiation Oncology | Admitting: Radiation Oncology

## 2014-05-23 DIAGNOSIS — C3491 Malignant neoplasm of unspecified part of right bronchus or lung: Secondary | ICD-10-CM | POA: Insufficient documentation

## 2014-05-23 DIAGNOSIS — C7949 Secondary malignant neoplasm of other parts of nervous system: Secondary | ICD-10-CM

## 2014-05-23 DIAGNOSIS — C7931 Secondary malignant neoplasm of brain: Secondary | ICD-10-CM | POA: Insufficient documentation

## 2014-05-23 DIAGNOSIS — Z87891 Personal history of nicotine dependence: Secondary | ICD-10-CM | POA: Insufficient documentation

## 2014-05-23 LAB — GLUCOSE, CAPILLARY
GLUCOSE-CAPILLARY: 131 mg/dL — AB (ref 70–99)
GLUCOSE-CAPILLARY: 129 mg/dL — AB (ref 70–99)
GLUCOSE-CAPILLARY: 135 mg/dL — AB (ref 70–99)
Glucose-Capillary: 114 mg/dL — ABNORMAL HIGH (ref 70–99)

## 2014-05-23 MED ORDER — DIPHENHYDRAMINE-ZINC ACETATE 2-0.1 % EX CREA
TOPICAL_CREAM | Freq: Three times a day (TID) | CUTANEOUS | Status: DC | PRN
  Administered 2014-05-23: 19:00:00 via TOPICAL
  Filled 2014-05-23: qty 28

## 2014-05-23 MED ORDER — DEXAMETHASONE 4 MG PO TABS
4.0000 mg | ORAL_TABLET | Freq: Four times a day (QID) | ORAL | Status: DC
  Administered 2014-05-23 – 2014-05-24 (×5): 4 mg via ORAL
  Filled 2014-05-23 (×8): qty 1
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
## 2014-05-23 NOTE — Progress Notes (Signed)
  Radiation Oncology         [phone] ________________________________  Name: [name] MRN: [id number]  Date: 05/23/2014  DOB: 1970-04-20  SIMULATION AND TREATMENT PLANNING NOTE    ICD-9-CM ICD-10-CM   1. Small cell lung cancer, right 162.9 C34.91   2. 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  Identity was confirmed.  All relevant records and images related to the planned course of therapy were reviewed.  The patient freely provided informed written consent to proceed with treatment after reviewing the details related to the planned course of therapy. The consent form was witnessed and verified by the simulation staff.  Then, the patient was set-up in a stable reproducible  supine position for radiation therapy.  CT images were obtained.  Surface markings were placed.  The CT images were loaded into the planning software.  Then the target and avoidance structures were contoured.  Treatment planning then occurred.  The radiation prescription was entered and confirmed.  Then, I designed and supervised the construction of a total of 3 medically necessary complex treatment devices.  I have requested : Isodose Plan.  I have ordered:dose calc.  PLAN:  The patient will receive 35 Gy in 14 fractions directed at the whole brain. Treatments will start later today  ________________________________  -----------------------------------  Blair Promise, PhD, MD
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
## 2014-05-23 NOTE — Progress Notes (Signed)
  Radiation Oncology         (336) [phone] ________________________________  Name: [name] MRN: [id number]  Date: 05/23/2014  DOB: [birth year]  Simulation Verification Note    ICD-9-CM ICD-10-CM   1. 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 The clinical setup was verified. Then port films were obtained and uploaded to the radiation oncology medical record software.  The treatment beams were carefully compared against the planned radiation fields. The position location and shape of the radiation fields was reviewed. They targeted volume of tissue appears to be appropriately covered by the radiation beams. Organs at risk appear to be excluded as planned.  Based on my personal review, I approved the simulation verification. The patient's treatment will proceed as planned.  -----------------------------------  Blair Promise, PhD, MD

## 2014-05-23 NOTE — Progress Notes (Signed)
Rehab admissions - I have been following pt's case and spoke with Dr. [place] Pink about pt's status. Per MD, pt is planned to have radiation treatments and there are no plans for surgical intervention. Pt is functionally moving very well and PT is recommending home with home health support. Inpatient rehab is no longer needed.  We also recommend that home with home health be pursued. I will now sign off pt's case. Thanks.  Nanetta Batty, PT Rehabilitation Admissions Coordinator [phone]

## 2014-05-24 ENCOUNTER — Ambulatory Visit
Admit: 2014-05-24 | Discharge: 2014-05-24 | Disposition: A | Discharge status: Home / Self Care | Payer: Medicaid Other | Attending: Radiation Oncology | Admitting: Radiation Oncology

## 2014-05-24 LAB — GLUCOSE, CAPILLARY
GLUCOSE-CAPILLARY: 114 mg/dL — AB (ref 70–99)
Glucose-Capillary: 128 mg/dL — ABNORMAL HIGH (ref 70–99)

## 2014-05-24 MED ORDER — POLYETHYLENE GLYCOL 3350 17 G PO PACK: 17.0000 g | PACK | Freq: Every day | ORAL | Status: DC

## 2014-05-24 MED ORDER — CLONIDINE HCL 0.2 MG PO TABS: 0.2000 mg | ORAL_TABLET | Freq: Three times a day (TID) | ORAL | Status: DC

## 2014-05-24 MED ORDER — DIPHENHYDRAMINE-ZINC ACETATE 2-0.1 % EX CREA: TOPICAL_CREAM | Freq: Three times a day (TID) | CUTANEOUS | Status: DC | PRN

## 2014-05-24 MED ORDER — LEVETIRACETAM 500 MG PO TABS: 500.0000 mg | ORAL_TABLET | Freq: Two times a day (BID) | ORAL | Status: DC

## 2014-05-24 MED ORDER — SENNOSIDES-DOCUSATE SODIUM 8.6-50 MG PO TABS: 2.0000 | ORAL_TABLET | Freq: Two times a day (BID) | ORAL | Status: DC

## 2014-05-24 MED ORDER — OXYCODONE HCL 15 MG PO TABS: 15.0000 mg | ORAL_TABLET | ORAL | Status: DC | PRN

## 2014-05-24 MED ORDER — DEXAMETHASONE 4 MG PO TABS: 4.0000 mg | ORAL_TABLET | Freq: Four times a day (QID) | ORAL | Status: DC

## 2014-05-24 NOTE — Discharge Instructions (Signed)
Lung Cancer Lung cancer is an abnormal growth of cells in one or both of your lungs. These extra cells may form a mass of tissue called a growth or tumor. Tumors can be either cancerous (malignant) or not cancerous (benign).  Lung cancer is the most common cause of cancer death in men and women. There are several different types of lung cancers. Usually, lung cancer is described as either small cell lung cancer or nonsmall cell lung cancer. Other types of cancer occur in the lungs, including carcinoid and cancers spread from other organs. The types of cancer have different behavior and treatment. RISK FACTORS Smoking is the most common risk factor for developing lung cancer. Other risk factors include:  Radon gas exposure.  Asbestos and other industrial substance exposure.  Second hand tobacco smoke.  Air pollution.  Family or personal history of lung cancer.  Age older than 76 years. CAUSES  Lung cancer usually starts when the lungs are exposed to harmful chemicals. Smoking is the most common risk factor for lung cancer. When you quit smoking, your risk of lung cancer falls each year (but is never the same as a person who has never smoked).  SYMPTOMS  Lung cancer may not have any symptoms in its early stages. The symptoms can depend on the type of cancer, its location, and other factors. Symptoms can include:  Cough (either new, different, or more severe).  Shortness of breath.  Coughing up blood (hemoptysis).  Chest pain.  Hoarseness.  Swelling of the face.  Drooping eyelid.  Changes in blood tests, such as low sodium (hyponatremia), high calcium (hypercalcemia), or low blood count (anemia).  Weight loss. DIAGNOSIS  Your health care provider may suspect lung cancer based on your symptoms or based on tests obtained for other reasons. Tests or procedures used to find or confirm the presence of lung cancer may include:  Chest X-ray.  CT scan of the lungs and  chest.  Blood tests.  Taking a tissue sample (biopsy) from your lung to look for cancer cells. Your cancer will be staged to determine its severity and extent. Staging is a careful attempt to find out the size of the tumor, whether the cancer has spread, and if so, to what parts of the body. You may need to have more tests to determine the stage of your cancer. The test results will help determine what treatment plan is best for you.   Stage 0--This is the earliest stage of lung cancer. In this stage the tumor is present in only a few layers of cells and has not grown beyond the inner lining of the lungs. Stage 0 (carcinoma in situ) is considered noninvasive, meaning at this stage it is not yet capable of spreading to other regions.  Stage I-- The cancer is located only in the lungs and not spread to any lymph nodes.  Stage II--The cancer is in the lungs and the nearby lymph nodes.  Stage III--The cancer is in the lungs and the lymph nodes in the middle of the chest. This is also called locally advanced disease. This stage has two subtypes:  Stage IIIa - The cancer has spread only to lymph nodes on the same side of the chest where the cancer started.  Stage IIIb - The cancer has spread to lymph nodes on the opposite side of the chest or above the collar bone.  Stage IV-- This is the most advanced stage of lung cancer and is also called advanced disease. This  stage describes when the cancer has spread to both lungs, the fluid in the area around the lungs, or to another body part. Your health care provider may tell you the detailed stage of your cancer, which includes both a number and a letter.  TREATMENT  Depending on the type and stage, lung cancer may be treated with surgery, radiation therapy, chemotherapy, or targeted therapy. Some people have a combination of these therapies. Your treatment plan will be developed by your health care team.  [place] not smoke.  Only  take over-the-counter or prescription medicines for pain, discomfort, or fever as directed by your health care provider.  Maintain a healthy diet.  Consider joining a support group. This may help you learn to cope with the stress of having lung cancer.  Seek advice to help you manage treatment side effects.  Keep all follow-up appointments as directed by your health care provider.  Inform your cancer specialist if you are admitted to the hospital. [place] IF:   You are losing weight without trying.  You have a persistent cough.  You feel short of breath.  You tire easily. SEEK IMMEDIATE MEDICAL CARE IF:   You cough up clotted blood or bright red blood.  Your pain is not manageable or controlled by medicine.  You develop new difficulty breathing or chest pain.  You develop swelling in one or both ankles or legs, or swelling in your face or neck.  You develop headache or confusion. Document Released: 05/10/2000 Document Revised: 11/22/2012 Document Reviewed: 06/07/2013 [facility] Patient Information 2015 [place], [place]. This information is not intended to replace advice given to you by your health care provider. Make sure you discuss any questions you have with your health care provider.   Cervical Collar A cervical collar is used to hold the head and neck still. This may be a soft, thick collar or a more rigid hard collar. This can keep your sore neck muscles from hurting. The collar also protects you in case a more serious injury of the neck is present and can not be seen yet on an x-ray. FOLLOW THESE INSTRUCTIONS FOR COLLAR USE:  Attach the Velcro tab in back.  Make it tight enough to support part of the weight of your chin.  Do not make it so tight that it hurts or makes it hard to breathe.  Wear it until you are comfortable without it or as instructed. HOME CARE INSTRUCTIONS   Ice packs to the neck or areas of pain approximately 03-04 times a day for 15-20  minutes while awake. Do this for 2 days. Ask your physician if you may remove the cervical collar for bathing or ice.  Do not remove any collar unless instructed by a caregiver. Ask if the collar may be removed for showering or eating.  Only take over-the-counter or prescription medicines for pain, discomfort, or fever as directed by your caregiver.  Do not drive a car until given permission by your caregiver.  Follow all instructions for follow-up with your caregiver. This includes any referrals, physical therapy, and rehabilitation. Any delay in obtaining necessary care could result in a delay or failure of the injury to heal properly. SEEK IMMEDIATE MEDICAL CARE IF:   You develop increasing pain.  You develop problems using your arms or legs or have tingling or other funny feelings in them.  You develop loss of strength in either of your arms or legs or have difficulty walking.  You develop a fever or shaking chills.  You lose control of your bowels or bladder. MAKE SURE YOU:   Understand these instructions.  Will watch your condition.  Will get help right away if you are not doing well or get worse. Document Released: 10/25/2003 Document Revised: 04/26/2011 Document Reviewed: 09/25/2007 [facility] Patient Information 2015 [place], [place]. This information is not intended to replace advice given to you by your health care provider. Make sure you discuss any questions you have with your health care provider.
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
## 2014-05-24 NOTE — Care Management Note (Signed)
CARE MANAGEMENT NOTE 05/24/2014  Patient:  [name],[name]   Account Number:  [id number]  Date Initiated:  05/24/2014  Documentation initiated by:  Marney Doctor  Subjective/Objective Assessment:   44 yo admitted with Acute respiratory failure     Action/Plan:   From home with significant other   Anticipated DC Date:  05/24/2014   Anticipated DC Plan:  [place]  CM consult      [facility] Choice  HOME HEALTH   Choice offered to / List presented to:  C-1 Patient        [place] arranged  HH-1 RN      [place].   Status of service:  Completed, signed off Medicare Important Message given?   (If response is "NO", the following Medicare IM given date fields will be blank) Date Medicare IM given:   Medicare IM given by:   Date Additional Medicare IM given:   Additional Medicare IM given by:    Discharge Disposition:    Per UR Regulation:  Reviewed for med. necessity/level of care/duration of stay  If discussed at [place] of Stay Meetings, dates discussed:    Comments:  05/24/14 Marney Doctor RN,BSN,NCM [phone] CM consult for [facility] and for [facility] services.  Met with pt who reports he does not have a PCP and has a application for Medicaid pending. Pt given information about [place] resources and this CM made pt a f/u appointment at the clinic and placed the time and date on the AVS.  Pt instructed to take his discharge prescriptions that he receives straight to the [facility] pharmacy today to have his medication filled and they would help him financially with his medications. Because pt has no insurance he will be set up with [facility] under their charity program and he will be able to receive a home RN for safety eval. No further CM needs noted.

## 2014-05-27 ENCOUNTER — Ambulatory Visit: Payer: Medicaid Other | Attending: Family Medicine | Admitting: Family Medicine

## 2014-05-27 ENCOUNTER — Ambulatory Visit
Admit: 2014-05-27 | Discharge: 2014-05-27 | Disposition: A | Discharge status: Home / Self Care | Payer: Medicaid Other | Attending: Radiation Oncology | Admitting: Radiation Oncology

## 2014-05-27 ENCOUNTER — Encounter: Payer: Self-pay | Admitting: Family Medicine

## 2014-05-27 VITALS — BP 125/76 | HR 67 | Temp 97.9°F | Resp 18 | Ht 68.0 in | Wt 184.0 lb

## 2014-05-27 DIAGNOSIS — G40301 Generalized idiopathic epilepsy and epileptic syndromes, not intractable, with status epilepticus: Secondary | ICD-10-CM

## 2014-05-27 DIAGNOSIS — S129XXD Fracture of neck, unspecified, subsequent encounter: Secondary | ICD-10-CM

## 2014-05-27 DIAGNOSIS — C3491 Malignant neoplasm of unspecified part of right bronchus or lung: Secondary | ICD-10-CM | POA: Diagnosis not present

## 2014-05-27 DIAGNOSIS — C7951 Secondary malignant neoplasm of bone: Secondary | ICD-10-CM | POA: Insufficient documentation

## 2014-05-27 DIAGNOSIS — C787 Secondary malignant neoplasm of liver and intrahepatic bile duct: Secondary | ICD-10-CM | POA: Diagnosis not present

## 2014-05-27 DIAGNOSIS — R569 Unspecified convulsions: Secondary | ICD-10-CM | POA: Insufficient documentation

## 2014-05-27 DIAGNOSIS — I10 Essential (primary) hypertension: Secondary | ICD-10-CM | POA: Diagnosis not present

## 2014-05-27 DIAGNOSIS — C7931 Secondary malignant neoplasm of brain: Secondary | ICD-10-CM | POA: Diagnosis not present

## 2014-05-27 DIAGNOSIS — S129XXA Fracture of neck, unspecified, initial encounter: Secondary | ICD-10-CM | POA: Insufficient documentation

## 2014-05-27 DIAGNOSIS — Z79899 Other long term (current) drug therapy: Secondary | ICD-10-CM | POA: Diagnosis not present

## 2014-05-27 DIAGNOSIS — G40901 Epilepsy, unspecified, not intractable, with status epilepticus: Secondary | ICD-10-CM

## 2014-05-27 DIAGNOSIS — Z87891 Personal history of nicotine dependence: Secondary | ICD-10-CM | POA: Diagnosis not present

## 2014-05-27 DIAGNOSIS — C3490 Malignant neoplasm of unspecified part of unspecified bronchus or lung: Secondary | ICD-10-CM | POA: Diagnosis not present

## 2014-05-27 DIAGNOSIS — R739 Hyperglycemia, unspecified: Secondary | ICD-10-CM | POA: Diagnosis not present

## 2014-05-27 DIAGNOSIS — C7949 Secondary malignant neoplasm of other parts of nervous system: Secondary | ICD-10-CM

## 2014-05-27 LAB — GLUCOSE, POCT (MANUAL RESULT ENTRY): POC Glucose: 135 mg/dl — AB (ref 70–99)

## 2014-05-27 LAB — POCT GLYCOSYLATED HEMOGLOBIN (HGB A1C): Hemoglobin A1C: 6.4

## 2014-05-27 MED ORDER — NIFEDIPINE ER OSMOTIC RELEASE 30 MG PO TB24: 30.0000 mg | ORAL_TABLET | Freq: Every day | ORAL | Status: DC

## 2014-05-27 MED ORDER — CLONIDINE HCL 0.1 MG PO TABS: 0.1000 mg | ORAL_TABLET | Freq: Three times a day (TID) | ORAL | Status: DC
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
## 2014-05-27 NOTE — Patient Instructions (Signed)

## 2014-05-28 ENCOUNTER — Ambulatory Visit
Admit: 2014-05-28 | Discharge: 2014-05-28 | Disposition: A | Discharge status: Home / Self Care | Payer: Medicaid Other | Attending: Radiation Oncology | Admitting: Radiation Oncology

## 2014-05-28 ENCOUNTER — Encounter: Payer: Self-pay | Admitting: Radiation Oncology

## 2014-05-28 ENCOUNTER — Ambulatory Visit
Admission: RE | Admit: 2014-05-28 | Discharge: 2014-05-28 | Disposition: A | Discharge status: Home / Self Care | Payer: Medicaid Other | Source: Ambulatory Visit | Attending: Radiation Oncology | Admitting: Radiation Oncology

## 2014-05-28 VITALS — BP 129/101 | HR 65 | Temp 98.2°F | Resp 20 | Wt 184.9 lb

## 2014-05-28 DIAGNOSIS — C3491 Malignant neoplasm of unspecified part of right bronchus or lung: Secondary | ICD-10-CM | POA: Diagnosis not present

## 2014-05-28 DIAGNOSIS — C7949 Secondary malignant neoplasm of other parts of nervous system: Principal | ICD-10-CM

## 2014-05-28 DIAGNOSIS — C7931 Secondary malignant neoplasm of brain: Secondary | ICD-10-CM

## 2014-05-28 MED ORDER — BIAFINE EX EMUL
CUTANEOUS | Status: DC | PRN
  Administered 2014-05-28: 14:00:00 via TOPICAL

## 2014-05-28 MED ORDER — FLUCONAZOLE 100 MG PO TABS: 100.0000 mg | ORAL_TABLET | Freq: Every day | ORAL | Status: DC

## 2014-05-28 NOTE — Progress Notes (Signed)
Quick Note:  Labs addressed at office as well as medications and patient was made aware. ______ 

## 2014-05-28 NOTE — Progress Notes (Addendum)
weekly rad txs whole brain 4/14 completed, no nausea, headaches or blurred vision, takes keppra (bid)and decadron (4mg  q 6h) looks like thrush starting on tongue, losing taste , c/o back pain and shoulder pain, pataient education done, Radiation therapy and you book ,biafine cream and Santiago Glad Hess,RN business card given, discussed ways to manage side effects, hearing loss, nausea, blurred vision, headacahes, skin irritation, fatigue, pain,  increase protein in diet, drink plenty water, , May need to eat 5-6 smaller meals instead of 3 large ones, sees MD weekly and prn   BP 129/101 mmHg  Pulse 65  Temp(Src) 98.2 F (36.8 C) (Oral)  Resp 20  SpO2 100% wt 184.9lb Wt Readings from Last 3 Encounters:  05/27/14 184 lb (83.462 kg)  05/24/14 181 lb (82.101 kg)

## 2014-05-28 NOTE — Progress Notes (Signed)
  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 . . . . . . . The patient presents for routine under treatment assessment.                                   The patient is without complaint. He denies any nausea or headaches or scalp irritation or hair loss                                 Set-up films were reviewed.                                 The chart was checked. Physical Findings. . .  weight is 184 lb 14.4 oz (83.87 kg). His oral temperature is 98.2 F (36.8 C). His blood pressure is 129/101 and his pulse is 65. His respiration is 20 and oxygen saturation is 100%. . The patient exhibits less confusion today. He does have a candidial infection along the soft palate and pharynx Impression . . . . . . . The patient is tolerating radiation. Plan . . . . . . . . . . . . Continue treatment as planned. The patient will be placed on Diflucan  ________________________________   Blair Promise, PhD, MD

## 2014-05-29 ENCOUNTER — Ambulatory Visit
Admit: 2014-05-29 | Discharge: 2014-05-29 | Disposition: A | Discharge status: Home / Self Care | Payer: Medicaid Other | Attending: Radiation Oncology | Admitting: Radiation Oncology

## 2014-05-29 DIAGNOSIS — C3491 Malignant neoplasm of unspecified part of right bronchus or lung: Secondary | ICD-10-CM | POA: Diagnosis not present

## 2014-05-30 ENCOUNTER — Ambulatory Visit
Admit: 2014-05-30 | Discharge: 2014-05-30 | Disposition: A | Discharge status: Home / Self Care | Payer: Medicaid Other | Attending: Radiation Oncology | Admitting: Radiation Oncology

## 2014-05-30 ENCOUNTER — Telehealth: Payer: Self-pay | Admitting: *Deleted

## 2014-05-30 DIAGNOSIS — C3491 Malignant neoplasm of unspecified part of right bronchus or lung: Secondary | ICD-10-CM | POA: Diagnosis not present

## 2014-05-30 NOTE — Telephone Encounter (Signed)
error 

## 2014-05-31 ENCOUNTER — Encounter: Payer: Self-pay | Admitting: Family Medicine

## 2014-05-31 ENCOUNTER — Ambulatory Visit: Payer: Medicaid Other | Attending: Family Medicine | Admitting: Family Medicine

## 2014-05-31 ENCOUNTER — Ambulatory Visit
Admit: 2014-05-31 | Discharge: 2014-05-31 | Disposition: A | Discharge status: Home / Self Care | Payer: Medicaid Other | Attending: Radiation Oncology | Admitting: Radiation Oncology

## 2014-05-31 VITALS — BP 109/65 | HR 71 | Temp 98.0°F | Resp 18 | Ht 68.0 in | Wt 190.4 lb

## 2014-05-31 DIAGNOSIS — R600 Localized edema: Secondary | ICD-10-CM | POA: Diagnosis not present

## 2014-05-31 DIAGNOSIS — I10 Essential (primary) hypertension: Secondary | ICD-10-CM | POA: Diagnosis not present

## 2014-05-31 DIAGNOSIS — Z87891 Personal history of nicotine dependence: Secondary | ICD-10-CM | POA: Insufficient documentation

## 2014-05-31 DIAGNOSIS — R609 Edema, unspecified: Secondary | ICD-10-CM | POA: Diagnosis not present

## 2014-05-31 DIAGNOSIS — C7931 Secondary malignant neoplasm of brain: Secondary | ICD-10-CM | POA: Diagnosis not present

## 2014-05-31 DIAGNOSIS — C3491 Malignant neoplasm of unspecified part of right bronchus or lung: Secondary | ICD-10-CM | POA: Diagnosis not present

## 2014-05-31 DIAGNOSIS — E119 Type 2 diabetes mellitus without complications: Secondary | ICD-10-CM | POA: Insufficient documentation

## 2014-05-31 DIAGNOSIS — R739 Hyperglycemia, unspecified: Secondary | ICD-10-CM | POA: Diagnosis not present

## 2014-05-31 DIAGNOSIS — R7303 Prediabetes: Secondary | ICD-10-CM

## 2014-05-31 DIAGNOSIS — R7309 Other abnormal glucose: Secondary | ICD-10-CM | POA: Diagnosis not present

## 2014-05-31 MED ORDER — FUROSEMIDE 20 MG PO TABS: 20.0000 mg | ORAL_TABLET | Freq: Every day | ORAL | Status: DC
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
## 2014-05-31 NOTE — Patient Instructions (Signed)

## 2014-05-31 NOTE — Progress Notes (Signed)
Patients feet are swollen.

## 2014-06-03 ENCOUNTER — Ambulatory Visit
Admit: 2014-06-03 | Discharge: 2014-06-03 | Disposition: A | Discharge status: Home / Self Care | Payer: Medicaid Other | Attending: Radiation Oncology | Admitting: Radiation Oncology

## 2014-06-03 DIAGNOSIS — C3491 Malignant neoplasm of unspecified part of right bronchus or lung: Secondary | ICD-10-CM | POA: Diagnosis not present

## 2014-06-04 ENCOUNTER — Ambulatory Visit
Admission: RE | Admit: 2014-06-04 | Discharge: 2014-06-04 | Disposition: A | Discharge status: Home / Self Care | Payer: No Typology Code available for payment source | Source: Ambulatory Visit | Attending: Radiation Oncology | Admitting: Radiation Oncology

## 2014-06-04 ENCOUNTER — Encounter: Payer: Self-pay | Admitting: *Deleted

## 2014-06-04 ENCOUNTER — Ambulatory Visit
Admit: 2014-06-04 | Discharge: 2014-06-04 | Disposition: A | Discharge status: Home / Self Care | Payer: Medicaid Other | Attending: Radiation Oncology | Admitting: Radiation Oncology

## 2014-06-04 ENCOUNTER — Encounter: Payer: Self-pay | Admitting: Radiation Oncology

## 2014-06-04 VITALS — BP 124/71 | HR 79 | Temp 97.9°F | Resp 20 | Ht 68.0 in | Wt 180.7 lb

## 2014-06-04 DIAGNOSIS — C7931 Secondary malignant neoplasm of brain: Secondary | ICD-10-CM

## 2014-06-04 DIAGNOSIS — C7949 Secondary malignant neoplasm of other parts of nervous system: Principal | ICD-10-CM

## 2014-06-04 DIAGNOSIS — C3491 Malignant neoplasm of unspecified part of right bronchus or lung: Secondary | ICD-10-CM | POA: Diagnosis not present

## 2014-06-04 NOTE — Progress Notes (Signed)
  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 . . . . . . . The patient presents for routine under treatment assessment.                                   The patient is without complaint. He denies any nausea or headaches. He has had some joint discomfort. He denies any hair loss at this time or significant scalp irritation.                                 Set-up films were reviewed.                                 The chart was checked. Physical Findings. . .  height is '5\' 8"'$  (1.727 m) and weight is 180 lb 11.2 oz (81.965 kg). His oral temperature is 97.9 F (36.6 C). His blood pressure is 124/71 and his pulse is 79. His respiration is 20 and oxygen saturation is 100%. . The oral cavity is moist without secondary infection. The lungs are clear. The heart has a regular rhythm and rate. No significant hair loss at this time. Impression . . . . . . . The patient is tolerating radiation. Plan . . . . . . . . . . . . Continue treatment as planned.  ________________________________   Blair Promise, PhD, MD

## 2014-06-04 NOTE — Progress Notes (Signed)
[name] has completed 9 fractions to his whole brain.  He reports pain in his knees and abdomen at a 7/10 due to constipation.  He reports taking oxycodone 3-4 tablets per day.   He took a stool softener and an over the counter laxative this morning.  His last bm was 1.5 days ago.  He is taking decadron 4 mg four times a day.  He is also taking Keppra twice a day.  He denies feeling dizzy, balance issues.  He reports sometimes seeing wavy lines.  He denies nausea.  His skin on his forehead and ears is dry.  Advised him to start using biafine twice a day.  BP 124/71 mmHg  Pulse 79  Temp(Src) 97.9 F (36.6 C) (Oral)  Resp 20  Ht '5\' 8"'$  (1.727 m)  Wt 180 lb 11.2 oz (81.965 kg)  BMI 27.48 kg/m2  SpO2 100%

## 2014-06-04 NOTE — Progress Notes (Signed)
[facility] Work  Clinical Social Work contacted patient to assess for psychosocial needs and determine if transportation is still a barrier to care.  CSW left voicemail to return call when convenient.  Polo Riley, MSW, LCSW, OSW-C Clinical Social Worker [facility] [phone]

## 2014-06-05 ENCOUNTER — Ambulatory Visit
Admit: 2014-06-05 | Discharge: 2014-06-05 | Disposition: A | Discharge status: Home / Self Care | Payer: Medicaid Other | Attending: Radiation Oncology | Admitting: Radiation Oncology

## 2014-06-05 DIAGNOSIS — C3491 Malignant neoplasm of unspecified part of right bronchus or lung: Secondary | ICD-10-CM | POA: Diagnosis not present

## 2014-06-06 ENCOUNTER — Ambulatory Visit
Admission: RE | Admit: 2014-06-06 | Discharge: 2014-06-06 | Disposition: A | Discharge status: Home / Self Care | Payer: Medicaid Other | Source: Ambulatory Visit | Attending: Radiation Oncology | Admitting: Radiation Oncology

## 2014-06-06 ENCOUNTER — Ambulatory Visit: Admit: 2014-06-06 | Payer: Medicaid Other

## 2014-06-07 ENCOUNTER — Ambulatory Visit: Payer: Medicaid Other

## 2014-06-07 ENCOUNTER — Inpatient Hospital Stay: Admit: 2014-06-07 | Payer: Self-pay | Admitting: General Surgery

## 2014-06-07 ENCOUNTER — Encounter (HOSPITAL_COMMUNITY): Payer: Self-pay | Admitting: Anesthesiology

## 2014-06-07 ENCOUNTER — Inpatient Hospital Stay (HOSPITAL_COMMUNITY): Payer: Medicaid Other

## 2014-06-07 ENCOUNTER — Inpatient Hospital Stay (HOSPITAL_COMMUNITY): Payer: Medicaid Other | Admitting: Anesthesiology

## 2014-06-07 ENCOUNTER — Inpatient Hospital Stay (HOSPITAL_COMMUNITY)
Admission: EM | Admit: 2014-06-07 | Discharge: 2014-06-14 | DRG: 330 | Disposition: A | Discharge status: Home Health | Payer: Medicaid Other | Attending: Surgery | Admitting: Surgery

## 2014-06-07 ENCOUNTER — Encounter (HOSPITAL_COMMUNITY): Admission: EM | Disposition: A | Discharge status: Home Health | Payer: Self-pay | Source: Home / Self Care

## 2014-06-07 ENCOUNTER — Emergency Department (HOSPITAL_COMMUNITY)
Admission: EM | Admit: 2014-06-07 | Discharge: 2014-06-07 | Disposition: A | Discharge status: Home / Self Care | Payer: Medicaid Other | Attending: Emergency Medicine | Admitting: Emergency Medicine

## 2014-06-07 ENCOUNTER — Encounter (HOSPITAL_COMMUNITY): Payer: Self-pay | Admitting: *Deleted

## 2014-06-07 ENCOUNTER — Emergency Department (HOSPITAL_COMMUNITY): Payer: Medicaid Other

## 2014-06-07 ENCOUNTER — Other Ambulatory Visit (HOSPITAL_COMMUNITY): Payer: Self-pay

## 2014-06-07 DIAGNOSIS — B9620 Unspecified Escherichia coli [E. coli] as the cause of diseases classified elsewhere: Secondary | ICD-10-CM | POA: Diagnosis present

## 2014-06-07 DIAGNOSIS — K5720 Diverticulitis of large intestine with perforation and abscess without bleeding: Secondary | ICD-10-CM | POA: Diagnosis present

## 2014-06-07 DIAGNOSIS — K651 Peritoneal abscess: Secondary | ICD-10-CM

## 2014-06-07 DIAGNOSIS — C787 Secondary malignant neoplasm of liver and intrahepatic bile duct: Secondary | ICD-10-CM | POA: Diagnosis present

## 2014-06-07 DIAGNOSIS — B954 Other streptococcus as the cause of diseases classified elsewhere: Secondary | ICD-10-CM | POA: Diagnosis present

## 2014-06-07 DIAGNOSIS — G40909 Epilepsy, unspecified, not intractable, without status epilepticus: Secondary | ICD-10-CM | POA: Diagnosis present

## 2014-06-07 DIAGNOSIS — Z9049 Acquired absence of other specified parts of digestive tract: Secondary | ICD-10-CM

## 2014-06-07 DIAGNOSIS — S22089D Unspecified fracture of T11-T12 vertebra, subsequent encounter for fracture with routine healing: Secondary | ICD-10-CM | POA: Diagnosis not present

## 2014-06-07 DIAGNOSIS — S12600D Unspecified displaced fracture of seventh cervical vertebra, subsequent encounter for fracture with routine healing: Secondary | ICD-10-CM

## 2014-06-07 DIAGNOSIS — C7931 Secondary malignant neoplasm of brain: Secondary | ICD-10-CM | POA: Diagnosis present

## 2014-06-07 DIAGNOSIS — K567 Ileus, unspecified: Secondary | ICD-10-CM | POA: Diagnosis not present

## 2014-06-07 DIAGNOSIS — N508 Other specified disorders of male genital organs: Secondary | ICD-10-CM | POA: Diagnosis present

## 2014-06-07 DIAGNOSIS — Z79899 Other long term (current) drug therapy: Secondary | ICD-10-CM

## 2014-06-07 DIAGNOSIS — C3490 Malignant neoplasm of unspecified part of unspecified bronchus or lung: Secondary | ICD-10-CM | POA: Diagnosis present

## 2014-06-07 DIAGNOSIS — S32059D Unspecified fracture of fifth lumbar vertebra, subsequent encounter for fracture with routine healing: Secondary | ICD-10-CM

## 2014-06-07 DIAGNOSIS — R109 Unspecified abdominal pain: Secondary | ICD-10-CM | POA: Diagnosis present

## 2014-06-07 DIAGNOSIS — S129XXA Fracture of neck, unspecified, initial encounter: Secondary | ICD-10-CM | POA: Diagnosis present

## 2014-06-07 DIAGNOSIS — R7309 Other abnormal glucose: Secondary | ICD-10-CM | POA: Diagnosis present

## 2014-06-07 DIAGNOSIS — I10 Essential (primary) hypertension: Secondary | ICD-10-CM | POA: Diagnosis present

## 2014-06-07 DIAGNOSIS — C7949 Secondary malignant neoplasm of other parts of nervous system: Secondary | ICD-10-CM | POA: Diagnosis present

## 2014-06-07 DIAGNOSIS — F17210 Nicotine dependence, cigarettes, uncomplicated: Secondary | ICD-10-CM | POA: Diagnosis present

## 2014-06-07 DIAGNOSIS — C801 Malignant (primary) neoplasm, unspecified: Secondary | ICD-10-CM | POA: Diagnosis not present

## 2014-06-07 DIAGNOSIS — S32029D Unspecified fracture of second lumbar vertebra, subsequent encounter for fracture with routine healing: Secondary | ICD-10-CM

## 2014-06-07 DIAGNOSIS — Z9889 Other specified postprocedural states: Secondary | ICD-10-CM | POA: Diagnosis not present

## 2014-06-07 DIAGNOSIS — Z95828 Presence of other vascular implants and grafts: Secondary | ICD-10-CM

## 2014-06-07 DIAGNOSIS — K631 Perforation of intestine (nontraumatic): Secondary | ICD-10-CM | POA: Diagnosis present

## 2014-06-07 DIAGNOSIS — R198 Other specified symptoms and signs involving the digestive system and abdomen: Secondary | ICD-10-CM | POA: Diagnosis present

## 2014-06-07 DIAGNOSIS — Z452 Encounter for adjustment and management of vascular access device: Secondary | ICD-10-CM

## 2014-06-07 DIAGNOSIS — E119 Type 2 diabetes mellitus without complications: Secondary | ICD-10-CM | POA: Diagnosis present

## 2014-06-07 HISTORY — DX: Malignant (primary) neoplasm, unspecified: C80.1

## 2014-06-07 HISTORY — DX: Secondary malignant neoplasm of brain: C79.31

## 2014-06-07 HISTORY — DX: Secondary malignant neoplasm of liver and intrahepatic bile duct: C78.7

## 2014-06-07 HISTORY — PX: COLOSTOMY: SHX63

## 2014-06-07 HISTORY — DX: Malignant neoplasm of unspecified part of unspecified bronchus or lung: C34.90

## 2014-06-07 HISTORY — PX: COLON RESECTION: SHX5231

## 2014-06-07 LAB — CBC WITH DIFFERENTIAL/PLATELET
BASOS PCT: 0 % (ref 0–1)
Basophils Absolute: 0 10*3/uL (ref 0.0–0.1)
Eosinophils Absolute: 0 10*3/uL (ref 0.0–0.7)
Eosinophils Relative: 0 % (ref 0–5)
HCT: 51.2 % (ref 39.0–52.0)
Hemoglobin: 17.7 g/dL — ABNORMAL HIGH (ref 13.0–17.0)
Lymphocytes Relative: 8 % — ABNORMAL LOW (ref 12–46)
Lymphs Abs: 1.2 10*3/uL (ref 0.7–4.0)
MCH: 31.7 pg (ref 26.0–34.0)
MCHC: 34.6 g/dL (ref 30.0–36.0)
MCV: 91.8 fL (ref 78.0–100.0)
MONO ABS: 0.1 10*3/uL (ref 0.1–1.0)
Monocytes Relative: 0 % — ABNORMAL LOW (ref 3–12)
NEUTROS PCT: 92 % — AB (ref 43–77)
Neutro Abs: 14.5 10*3/uL — ABNORMAL HIGH (ref 1.7–7.7)
Platelets: 221 10*3/uL (ref 150–400)
RBC: 5.58 MIL/uL (ref 4.22–5.81)
RDW: 12.6 % (ref 11.5–15.5)
WBC: 15.7 10*3/uL — ABNORMAL HIGH (ref 4.0–10.5)

## 2014-06-07 LAB — URINALYSIS, ROUTINE W REFLEX MICROSCOPIC
Glucose, UA: NEGATIVE mg/dL
HGB URINE DIPSTICK: NEGATIVE
Ketones, ur: NEGATIVE mg/dL
Leukocytes, UA: NEGATIVE
Nitrite: NEGATIVE
Protein, ur: NEGATIVE mg/dL
Specific Gravity, Urine: 1.02 (ref 1.005–1.030)
Urobilinogen, UA: >8 mg/dL — ABNORMAL HIGH (ref 0.0–1.0)
pH: 6 (ref 5.0–8.0)

## 2014-06-07 LAB — COMPREHENSIVE METABOLIC PANEL
ALT: 322 U/L — ABNORMAL HIGH (ref 0–53)
ANION GAP: 10 (ref 5–15)
AST: 59 U/L — ABNORMAL HIGH (ref 0–37)
Albumin: 2.9 g/dL — ABNORMAL LOW (ref 3.5–5.2)
Alkaline Phosphatase: 146 U/L — ABNORMAL HIGH (ref 39–117)
BILIRUBIN TOTAL: 2.2 mg/dL — AB (ref 0.3–1.2)
BUN: 20 mg/dL (ref 6–23)
CO2: 22 mmol/L (ref 19–32)
CREATININE: 0.5 mg/dL (ref 0.50–1.35)
Calcium: 8.1 mg/dL — ABNORMAL LOW (ref 8.4–10.5)
Chloride: 100 mmol/L (ref 96–112)
GFR calc non Af Amer: >90 mL/min (ref >90)
GLUCOSE: 195 mg/dL — AB (ref 70–99)
Potassium: 3.4 mmol/L — ABNORMAL LOW (ref 3.5–5.1)
Sodium: 132 mmol/L — ABNORMAL LOW (ref 135–145)
Total Protein: 5.9 g/dL — ABNORMAL LOW (ref 6.0–8.3)

## 2014-06-07 LAB — RAPID URINE DRUG SCREEN, HOSP PERFORMED
Amphetamines: NOT DETECTED
Barbiturates: NOT DETECTED
Benzodiazepines: POSITIVE — AB
COCAINE: NOT DETECTED
OPIATES: POSITIVE — AB
Tetrahydrocannabinol: POSITIVE — AB

## 2014-06-07 LAB — PROTIME-INR
INR: 1 (ref 0.00–1.49)
Prothrombin Time: 13.3 seconds (ref 11.6–15.2)

## 2014-06-07 LAB — TROPONIN I: Troponin I: <0.03 ng/mL (ref <0.031)

## 2014-06-07 LAB — I-STAT CG4 LACTIC ACID, ED: Lactic Acid, Venous: 2.81 mmol/L (ref 0.5–2.0)

## 2014-06-07 LAB — SURGICAL PCR SCREEN
MRSA, PCR: POSITIVE — AB
Staphylococcus aureus: POSITIVE — AB

## 2014-06-07 LAB — LIPASE, BLOOD: Lipase: 44 U/L (ref 11–59)

## 2014-06-07 SURGERY — COLON RESECTION
Anesthesia: General | Site: Abdomen

## 2014-06-07 SURGERY — COLECTOMY, SIGMOID, OPEN
Anesthesia: General

## 2014-06-07 MED ORDER — HYDROCORTISONE NA SUCCINATE PF 100 MG IJ SOLR: 100.0000 mg | Freq: Once | INTRAMUSCULAR | Status: DC

## 2014-06-07 MED ORDER — HYDROMORPHONE 0.3 MG/ML IV SOLN
INTRAVENOUS | Status: DC
  Administered 2014-06-07: 14:00:00 via INTRAVENOUS
  Filled 2014-06-07: qty 25

## 2014-06-07 MED ORDER — PANTOPRAZOLE SODIUM 40 MG IV SOLR
40.0000 mg | Freq: Every day | INTRAVENOUS | Status: DC
  Administered 2014-06-07 – 2014-06-13 (×7): 40 mg via INTRAVENOUS
  Filled 2014-06-07 (×9): qty 40

## 2014-06-07 MED ORDER — SODIUM CHLORIDE 0.9 % IV BOLUS (SEPSIS)
1000.0000 mL | Freq: Once | INTRAVENOUS | Status: AC
  Administered 2014-06-07: 1000 mL via INTRAVENOUS

## 2014-06-07 MED ORDER — NEOSTIGMINE METHYLSULFATE 10 MG/10ML IV SOLN
INTRAVENOUS | Status: DC | PRN
  Administered 2014-06-07: 4 mg via INTRAVENOUS

## 2014-06-07 MED ORDER — SODIUM CHLORIDE 0.9 % IJ SOLN
INTRAMUSCULAR | Status: AC
  Filled 2014-06-07: qty 10

## 2014-06-07 MED ORDER — HYDROMORPHONE HCL 1 MG/ML IJ SOLN
INTRAMUSCULAR | Status: DC | PRN
  Administered 2014-06-07 (×5): .4 mg via INTRAVENOUS

## 2014-06-07 MED ORDER — HYDROMORPHONE HCL 1 MG/ML IJ SOLN
0.5000 mg | INTRAMUSCULAR | Status: DC | PRN
  Administered 2014-06-07 – 2014-06-09 (×15): 2 mg via INTRAVENOUS
  Administered 2014-06-10 – 2014-06-11 (×2): 1 mg via INTRAVENOUS
  Administered 2014-06-11: 2 mg via INTRAVENOUS
  Administered 2014-06-11 (×4): 1 mg via INTRAVENOUS
  Administered 2014-06-12 – 2014-06-13 (×11): 2 mg via INTRAVENOUS
  Administered 2014-06-13: 1 mg via INTRAVENOUS
  Administered 2014-06-13 (×3): 2 mg via INTRAVENOUS
  Filled 2014-06-07: qty 2
  Filled 2014-06-07: qty 1
  Filled 2014-06-07 (×8): qty 2
  Filled 2014-06-07: qty 1
  Filled 2014-06-07: qty 2
  Filled 2014-06-07: qty 1
  Filled 2014-06-07: qty 2
  Filled 2014-06-07: qty 1
  Filled 2014-06-07: qty 2
  Filled 2014-06-07: qty 1
  Filled 2014-06-07: qty 2
  Filled 2014-06-07: qty 1
  Filled 2014-06-07 (×7): qty 2
  Filled 2014-06-07: qty 1
  Filled 2014-06-07 (×11): qty 2

## 2014-06-07 MED ORDER — SUCCINYLCHOLINE CHLORIDE 20 MG/ML IJ SOLN
INTRAMUSCULAR | Status: DC | PRN
  Administered 2014-06-07: 100 mg via INTRAVENOUS

## 2014-06-07 MED ORDER — PIPERACILLIN-TAZOBACTAM 4.5 G IVPB
4.5000 g | Freq: Once | INTRAVENOUS | Status: DC
Start: 2014-06-07 — End: 2014-06-07
  Filled 2014-06-07: qty 100

## 2014-06-07 MED ORDER — DEXAMETHASONE SODIUM PHOSPHATE 10 MG/ML IJ SOLN
INTRAMUSCULAR | Status: DC | PRN
  Administered 2014-06-07: 10 mg via INTRAVENOUS

## 2014-06-07 MED ORDER — HYDROMORPHONE HCL 1 MG/ML IJ SOLN
1.0000 mg | Freq: Once | INTRAMUSCULAR | Status: AC
  Administered 2014-06-07: 1 mg via INTRAVENOUS
  Filled 2014-06-07: qty 1

## 2014-06-07 MED ORDER — SUFENTANIL CITRATE 50 MCG/ML IV SOLN
INTRAVENOUS | Status: AC
  Filled 2014-06-07: qty 1

## 2014-06-07 MED ORDER — HYDROMORPHONE HCL 2 MG/ML IJ SOLN
INTRAMUSCULAR | Status: AC
  Filled 2014-06-07: qty 1

## 2014-06-07 MED ORDER — DIPHENHYDRAMINE HCL 12.5 MG/5ML PO ELIX: 12.5000 mg | ORAL_SOLUTION | Freq: Four times a day (QID) | ORAL | Status: DC | PRN

## 2014-06-07 MED ORDER — FENTANYL CITRATE (PF) 100 MCG/2ML IJ SOLN
INTRAMUSCULAR | Status: DC | PRN
Start: 2014-06-07 — End: 2014-06-07
  Administered 2014-06-07: 50 ug via INTRAVENOUS
  Administered 2014-06-07: 100 ug via INTRAVENOUS
  Administered 2014-06-07: 50 ug via INTRAVENOUS

## 2014-06-07 MED ORDER — EPHEDRINE SULFATE 50 MG/ML IJ SOLN
INTRAMUSCULAR | Status: AC
Start: 2014-06-07 — End: 2014-06-07
  Filled 2014-06-07: qty 1

## 2014-06-07 MED ORDER — HYDROMORPHONE HCL 1 MG/ML IJ SOLN
INTRAMUSCULAR | Status: AC
  Filled 2014-06-07: qty 1

## 2014-06-07 MED ORDER — POTASSIUM CHLORIDE IN NACL 20-0.9 MEQ/L-% IV SOLN
INTRAVENOUS | Status: DC
  Administered 2014-06-07 – 2014-06-10 (×4): via INTRAVENOUS
  Filled 2014-06-07 (×9): qty 1000

## 2014-06-07 MED ORDER — SODIUM CHLORIDE 0.9 % IJ SOLN
INTRAMUSCULAR | Status: AC
  Filled 2014-06-07: qty 20

## 2014-06-07 MED ORDER — MIDAZOLAM HCL 5 MG/5ML IJ SOLN
INTRAMUSCULAR | Status: DC | PRN
  Administered 2014-06-07: 2 mg via INTRAVENOUS

## 2014-06-07 MED ORDER — NALOXONE HCL 0.4 MG/ML IJ SOLN: 0.4000 mg | INTRAMUSCULAR | Status: DC | PRN

## 2014-06-07 MED ORDER — ONDANSETRON HCL 4 MG/2ML IJ SOLN: 4.0000 mg | Freq: Four times a day (QID) | INTRAMUSCULAR | Status: DC | PRN

## 2014-06-07 MED ORDER — LACTATED RINGERS IV SOLN
INTRAVENOUS | Status: DC | PRN
  Administered 2014-06-07 (×3): via INTRAVENOUS

## 2014-06-07 MED ORDER — LIDOCAINE HCL (CARDIAC) 20 MG/ML IV SOLN
INTRAVENOUS | Status: AC
Start: 2014-06-07 — End: 2014-06-07
  Filled 2014-06-07: qty 5

## 2014-06-07 MED ORDER — PIPERACILLIN-TAZOBACTAM 3.375 G IVPB 30 MIN: 3.3750 g | Freq: Three times a day (TID) | INTRAVENOUS | Status: DC

## 2014-06-07 MED ORDER — PIPERACILLIN-TAZOBACTAM 3.375 G IVPB 30 MIN
3.3750 g | Freq: Three times a day (TID) | INTRAVENOUS | Status: DC
  Administered 2014-06-07 – 2014-06-14 (×20): 3.375 g via INTRAVENOUS
  Filled 2014-06-07 (×25): qty 50

## 2014-06-07 MED ORDER — FENTANYL CITRATE (PF) 100 MCG/2ML IJ SOLN
INTRAMUSCULAR | Status: AC
  Filled 2014-06-07: qty 2

## 2014-06-07 MED ORDER — LACTATED RINGERS IV SOLN: INTRAVENOUS | Status: DC

## 2014-06-07 MED ORDER — LABETALOL HCL 5 MG/ML IV SOLN
INTRAVENOUS | Status: DC | PRN
  Administered 2014-06-07: 5 mg via INTRAVENOUS

## 2014-06-07 MED ORDER — SUFENTANIL CITRATE 50 MCG/ML IV SOLN
INTRAVENOUS | Status: DC | PRN
  Administered 2014-06-07 (×3): 10 ug via INTRAVENOUS
  Administered 2014-06-07: 20 ug via INTRAVENOUS
  Administered 2014-06-07 (×3): 10 ug via INTRAVENOUS
  Administered 2014-06-07: 20 ug via INTRAVENOUS

## 2014-06-07 MED ORDER — PROPOFOL 10 MG/ML IV BOLUS
INTRAVENOUS | Status: AC
  Filled 2014-06-07: qty 20

## 2014-06-07 MED ORDER — GLYCOPYRROLATE 0.2 MG/ML IJ SOLN
INTRAMUSCULAR | Status: DC | PRN
  Administered 2014-06-07: .6 mg via INTRAVENOUS

## 2014-06-07 MED ORDER — PIPERACILLIN-TAZOBACTAM 3.375 G IVPB 30 MIN
3.3750 g | Freq: Once | INTRAVENOUS | Status: AC
  Administered 2014-06-07: 3.375 g via INTRAVENOUS
  Filled 2014-06-07: qty 50

## 2014-06-07 MED ORDER — IOHEXOL 300 MG/ML  SOLN
100.0000 mL | Freq: Once | INTRAMUSCULAR | Status: AC | PRN
  Administered 2014-06-07: 100 mL via INTRAVENOUS

## 2014-06-07 MED ORDER — VANCOMYCIN HCL IN DEXTROSE 1-5 GM/200ML-% IV SOLN
1000.0000 mg | Freq: Three times a day (TID) | INTRAVENOUS | Status: DC
  Administered 2014-06-07 – 2014-06-10 (×8): 1000 mg via INTRAVENOUS
  Filled 2014-06-07 (×9): qty 200

## 2014-06-07 MED ORDER — ONDANSETRON HCL 4 MG/2ML IJ SOLN
INTRAMUSCULAR | Status: AC
  Filled 2014-06-07: qty 2

## 2014-06-07 MED ORDER — KETAMINE HCL 10 MG/ML IJ SOLN
INTRAMUSCULAR | Status: AC
  Filled 2014-06-07: qty 1

## 2014-06-07 MED ORDER — PROPOFOL 10 MG/ML IV BOLUS
INTRAVENOUS | Status: DC | PRN
Start: 2014-06-07 — End: 2014-06-07
  Administered 2014-06-07: 180 mg via INTRAVENOUS

## 2014-06-07 MED ORDER — HYDROMORPHONE HCL 1 MG/ML IJ SOLN
1.0000 mg | Freq: Once | INTRAMUSCULAR | Status: AC
  Administered 2014-06-07: 1 mg via INTRAVENOUS

## 2014-06-07 MED ORDER — HYDROMORPHONE HCL 2 MG/ML IJ SOLN
2.0000 mg | Freq: Once | INTRAMUSCULAR | Status: AC
  Administered 2014-06-07: 2 mg via INTRAVENOUS
  Filled 2014-06-07: qty 1

## 2014-06-07 MED ORDER — SODIUM CHLORIDE 0.9 % IJ SOLN: 9.0000 mL | INTRAMUSCULAR | Status: DC | PRN

## 2014-06-07 MED ORDER — DEXAMETHASONE SODIUM PHOSPHATE 10 MG/ML IJ SOLN
INTRAMUSCULAR | Status: AC
  Filled 2014-06-07: qty 1

## 2014-06-07 MED ORDER — HEPARIN SODIUM (PORCINE) 5000 UNIT/ML IJ SOLN
5000.0000 [IU] | Freq: Three times a day (TID) | INTRAMUSCULAR | Status: DC
  Administered 2014-06-08 – 2014-06-14 (×19): 5000 [IU] via SUBCUTANEOUS
  Filled 2014-06-07 (×23): qty 1

## 2014-06-07 MED ORDER — SODIUM CHLORIDE 0.9 % IV SOLN
500.0000 mg | Freq: Two times a day (BID) | INTRAVENOUS | Status: DC
  Administered 2014-06-07 – 2014-06-14 (×14): 500 mg via INTRAVENOUS
  Filled 2014-06-07 (×15): qty 5

## 2014-06-07 MED ORDER — STERILE WATER FOR INJECTION IJ SOLN
INTRAMUSCULAR | Status: AC
  Filled 2014-06-07: qty 10

## 2014-06-07 MED ORDER — CISATRACURIUM BESYLATE 20 MG/10ML IV SOLN
INTRAVENOUS | Status: AC
  Filled 2014-06-07: qty 10

## 2014-06-07 MED ORDER — HYDROMORPHONE HCL 1 MG/ML IJ SOLN
0.2500 mg | INTRAMUSCULAR | Status: DC | PRN
  Administered 2014-06-07 (×4): 0.5 mg via INTRAVENOUS

## 2014-06-07 MED ORDER — CISATRACURIUM BESYLATE (PF) 10 MG/5ML IV SOLN
INTRAVENOUS | Status: DC | PRN
  Administered 2014-06-07: 14 mg via INTRAVENOUS
  Administered 2014-06-07: 4 mg via INTRAVENOUS

## 2014-06-07 MED ORDER — SODIUM CHLORIDE 0.9 % IV SOLN
100.0000 mg | Freq: Every day | INTRAVENOUS | Status: AC
  Administered 2014-06-07 – 2014-06-13 (×7): 100 mg via INTRAVENOUS
  Filled 2014-06-07 (×7): qty 100

## 2014-06-07 MED ORDER — HYDROCORTISONE SOD SUCCINATE 100 MG PF FOR IT USE: 100.0000 mg | Freq: Once | INTRAMUSCULAR | Status: DC

## 2014-06-07 MED ORDER — MIDAZOLAM HCL 2 MG/2ML IJ SOLN
INTRAMUSCULAR | Status: AC
Start: 2014-06-07 — End: 2014-06-07
  Filled 2014-06-07: qty 2

## 2014-06-07 MED ORDER — LACTATED RINGERS IV SOLN
INTRAVENOUS | Status: DC | PRN
  Administered 2014-06-07: 17:00:00 via INTRAVENOUS

## 2014-06-07 MED ORDER — 0.9 % SODIUM CHLORIDE (POUR BTL) OPTIME
TOPICAL | Status: DC | PRN
  Administered 2014-06-07: 6000 mL

## 2014-06-07 MED ORDER — DIPHENHYDRAMINE HCL 50 MG/ML IJ SOLN: 12.5000 mg | Freq: Four times a day (QID) | INTRAMUSCULAR | Status: DC | PRN

## 2014-06-07 MED ORDER — HYDROMORPHONE HCL 2 MG/ML IJ SOLN: 2.0000 mg | Freq: Once | INTRAMUSCULAR | Status: DC

## 2014-06-07 MED ORDER — SODIUM CHLORIDE 0.9 % IV BOLUS (SEPSIS)
30.0000 mL/kg | Freq: Once | INTRAVENOUS | Status: AC
Start: 2014-06-07 — End: 2014-06-07
  Administered 2014-06-07: 2460 mL via INTRAVENOUS

## 2014-06-07 MED ORDER — LIDOCAINE HCL (CARDIAC) 20 MG/ML IV SOLN
INTRAVENOUS | Status: DC | PRN
  Administered 2014-06-07: 100 mg via INTRAVENOUS

## 2014-06-07 SURGICAL SUPPLY — 52 items
APPLICATOR COTTON TIP 6IN STRL (MISCELLANEOUS) IMPLANT
BLADE EXTENDED COATED 6.5IN (ELECTRODE) ×3 IMPLANT
BLADE HEX COATED 2.75 (ELECTRODE) ×3 IMPLANT
CLIP TI LARGE 6 (CLIP) IMPLANT
COVER PROBE U/S 5X48 (MISCELLANEOUS) ×3 IMPLANT
DRAPE LAPAROSCOPIC ABDOMINAL (DRAPES) ×3 IMPLANT
ELECT REM PT RETURN 9FT ADLT (ELECTROSURGICAL) ×3
ELECTRODE REM PT RTRN 9FT ADLT (ELECTROSURGICAL) ×1 IMPLANT
GAUZE SPONGE 4X4 12PLY STRL (GAUZE/BANDAGES/DRESSINGS) ×3 IMPLANT
GLOVE BIOGEL PI IND STRL 7.0 (GLOVE) ×1 IMPLANT
GLOVE BIOGEL PI INDICATOR 7.0 (GLOVE) ×2
GLOVE EUDERMIC 7 POWDERFREE (GLOVE) ×6 IMPLANT
GOWN SPEC L4 XLG W/TWL (GOWN DISPOSABLE) ×3 IMPLANT
GOWN STRL REUS W/ TWL XL LVL3 (GOWN DISPOSABLE) ×3 IMPLANT
GOWN STRL REUS W/TWL LRG LVL3 (GOWN DISPOSABLE) ×3 IMPLANT
GOWN STRL REUS W/TWL XL LVL3 (GOWN DISPOSABLE) ×6
HOLDER FOLEY CATH W/STRAP (MISCELLANEOUS) ×3 IMPLANT
LEGGING LITHOTOMY PAIR STRL (DRAPES) ×3 IMPLANT
LIGASURE IMPACT 36 18CM CVD LR (INSTRUMENTS) ×3 IMPLANT
LUBRICANT JELLY K Y 4OZ (MISCELLANEOUS) ×3 IMPLANT
NS IRRIG 1000ML POUR BTL (IV SOLUTION) ×6 IMPLANT
PACK COLON (CUSTOM PROCEDURE TRAY) ×3 IMPLANT
PACK GENERAL/GYN (CUSTOM PROCEDURE TRAY) ×3 IMPLANT
POUCH OSTOMY 2  H (OSTOMY) ×3 IMPLANT
SHEARS HARMONIC ACE PLUS 36CM (ENDOMECHANICALS) IMPLANT
STAPLER CUT CVD 40MM BLUE (STAPLE) ×3 IMPLANT
STAPLER CUT RELOAD GREEN (STAPLE) ×3 IMPLANT
STAPLER VISISTAT 35W (STAPLE) IMPLANT
SUT NOV 1 T60/GS (SUTURE) ×6 IMPLANT
SUT NOVA 1 T20/GS 25DT (SUTURE) ×6 IMPLANT
SUT NOVA NAB DX-16 0-1 5-0 T12 (SUTURE) IMPLANT
SUT NOVA T20/GS 25 (SUTURE) IMPLANT
SUT PDS AB 1 CTX 36 (SUTURE) IMPLANT
SUT PDS AB 1 TP1 96 (SUTURE) ×6 IMPLANT
SUT PROLENE 2 0 KS (SUTURE) IMPLANT
SUT PROLENE 2 0 SH DA (SUTURE) ×3 IMPLANT
SUT SILK 2 0 (SUTURE) ×2
SUT SILK 2 0 SH CR/8 (SUTURE) ×3 IMPLANT
SUT SILK 2 0SH CR/8 30 (SUTURE) IMPLANT
SUT SILK 2-0 18XBRD TIE 12 (SUTURE) ×1 IMPLANT
SUT SILK 2-0 30XBRD TIE 12 (SUTURE) IMPLANT
SUT SILK 3 0 (SUTURE) ×2
SUT SILK 3 0 SH CR/8 (SUTURE) ×3 IMPLANT
SUT SILK 3-0 18XBRD TIE 12 (SUTURE) ×1 IMPLANT
SUT VIC AB 2-0 SH 18 (SUTURE) ×3 IMPLANT
SUT VIC AB 3-0 SH 18 (SUTURE) ×3 IMPLANT
SWAB COLLECTION DEVICE MRSA (MISCELLANEOUS) ×3 IMPLANT
TAPE CLOTH SURG 4X10 WHT LF (GAUZE/BANDAGES/DRESSINGS) ×3 IMPLANT
TRAY FOLEY CATH 16FRSI W/METER (SET/KITS/TRAYS/PACK) ×3 IMPLANT
TRAY FOLEY W/METER SILVER 14FR (SET/KITS/TRAYS/PACK) IMPLANT
TUBE ANAEROBIC SPECIMEN COL (MISCELLANEOUS) ×3 IMPLANT
YANKAUER SUCT BULB TIP NO VENT (SUCTIONS) ×3 IMPLANT
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
## 2014-06-07 NOTE — Progress Notes (Signed)
Pt active with [facility] for services ED CM made [name] of [facility] aware of pt  admission to 1227- ICU

## 2014-06-07 NOTE — ED Notes (Signed)
Bed: WT88 Expected date:  Expected time:  Means of arrival:  Comments: Ems- abd pain

## 2014-06-07 NOTE — ED Notes (Signed)
Patient transported to CT 

## 2014-06-07 NOTE — ED Notes (Signed)
Patient stuck multiple times for blood, by myself and RN Charlann Boxer. Main Lab phlebotomy called to come stick the patient. Main lab said "it'll be awhile. And I'll add it to our stack"

## 2014-06-07 NOTE — ED Notes (Signed)
OR states they are ready for pt and are sending a transporter over for pt.
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
## 2014-06-07 NOTE — Anesthesia Postprocedure Evaluation (Signed)
  Anesthesia Post-op Note  Patient: [name]  Procedure(s) Performed: Procedure(s) (LRB): SIGMOID COLON RESECTION/COLOSTOMY WITH HARTMAN'S POUCH (N/A)  Patient Location: PACU  Anesthesia Type: General  Level of Consciousness: awake and alert   Airway and Oxygen Therapy: Patient Spontanous Breathing  Post-op Pain: mild  Post-op Assessment: Post-op Vital signs reviewed, Patient's Cardiovascular Status Stable, Respiratory Function Stable, Patent Airway and No signs of Nausea or vomiting  Last Vitals:  Filed Vitals:   06/07/14 2145  BP: 167/92  Pulse: 87  Temp:   Resp: 16    Post-op Vital Signs: stable   Complications: No apparent anesthesia complications
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
## 2014-06-07 NOTE — ED Notes (Signed)
Bed: GU44 Expected date:  Expected time:  Means of arrival:  Comments: Pt still in room waiting for OR

## 2014-06-07 NOTE — ED Notes (Addendum)
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

## 2014-06-07 NOTE — Anesthesia Preprocedure Evaluation (Addendum)
Anesthesia Evaluation  Patient identified by MRN, date of birth, ID band Patient awake    Reviewed: Allergy & Precautions, H&P , NPO status , Patient's Chart, lab work & pertinent test results  Airway Mallampati: II  TM Distance: >3 FB Neck ROM: Limited    Dental  (+) Dental Advisory Given, Poor Dentition, Missing   Pulmonary neg pulmonary ROS, former smoker,  Lung cancer. History respiratory failure. breath sounds clear to auscultation  Pulmonary exam normal       Cardiovascular Exercise Tolerance: Good hypertension, Pt. on medications negative cardio ROS  Rhythm:regular Rate:Normal     Neuro/Psych Seizures -, Well Controlled,  Brain mets being radiated now.  Neck fracture healing with cervical collar on negative neurological ROS  negative psych ROS   GI/Hepatic negative GI ROS, Neg liver ROS,   Endo/Other  negative endocrine ROShyperglycemia  Renal/GU negative Renal ROS  negative genitourinary   Musculoskeletal   Abdominal   Peds  Hematology negative hematology ROS (+)   Anesthesia Other Findings   Reproductive/Obstetrics negative OB ROS                             Anesthesia Physical Anesthesia Plan  ASA: IV and emergent  Anesthesia Plan: General   Post-op Pain Management:    Induction: Intravenous  Airway Management Planned: Video Laryngoscope Planned and Oral ETT  Additional Equipment:   Intra-op Plan:   Post-operative Plan: Extubation in OR  Informed Consent: I have reviewed the patients History and Physical, chart, labs and discussed the procedure including the risks, benefits and alternatives for the proposed anesthesia with the patient or authorized representative who has indicated his/her understanding and acceptance.   Dental Advisory Given  Plan Discussed with: CRNA and Surgeon  Anesthesia Plan Comments:         Anesthesia Quick Evaluation
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
## 2014-06-07 NOTE — Progress Notes (Signed)
44 yr old [place] male covered by [employer] of [place].  Pt and male at bedside reports pt just recently approved for medicaid but does not have a pcp yet Reports pt was referred to [facility] and CM noted pt has an upcoming appt at [facility] to see dr Vernon Prey on 06/11/14 at 0900 to establish care- EPIC updated with this MD Cm offered a list [place] providers, DSS contact information and telephone number  Pt noted while CM present to swipe his head and a hand full of hair war removed. Pt states he recently finished radiation.
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
## 2014-06-07 NOTE — Transfer of Care (Signed)
Immediate Anesthesia Transfer of Care Note  Patient: [name]  Procedure(s) Performed: Procedure(s) with comments: SIGMOID COLON RESECTION/COLOSTOMY WITH HARTMAN'S POUCH (N/A) - colostomy   Patient Location: PACU  Anesthesia Type:General  Level of Consciousness: awake  Airway & Oxygen Therapy: Patient Spontanous Breathing and Patient connected to face mask oxygen  Post-op Assessment: Report given to RN and Post -op Vital signs reviewed and stable  Post vital signs: Reviewed and stable  Last Vitals:  Filed Vitals:   06/07/14 1426  BP:   Pulse:   Temp:   Resp: 20    Complications: No apparent anesthesia complications

## 2014-06-07 NOTE — ED Notes (Signed)
70ms of hydromorphone wasted of PCA with Chere, RN prior to patient transport of OR.
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
## 2014-06-08 DIAGNOSIS — Z9889 Other specified postprocedural states: Secondary | ICD-10-CM

## 2014-06-08 DIAGNOSIS — Z9049 Acquired absence of other specified parts of digestive tract: Secondary | ICD-10-CM

## 2014-06-08 LAB — COMPREHENSIVE METABOLIC PANEL
ALBUMIN: 2.3 g/dL — AB (ref 3.5–5.2)
ALT: 186 U/L — AB (ref 0–53)
AST: 28 U/L (ref 0–37)
Alkaline Phosphatase: 103 U/L (ref 39–117)
Anion gap: 9 (ref 5–15)
BUN: 18 mg/dL (ref 6–23)
CALCIUM: 7.6 mg/dL — AB (ref 8.4–10.5)
CO2: 21 mmol/L (ref 19–32)
Chloride: 104 mmol/L (ref 96–112)
Creatinine, Ser: 0.62 mg/dL (ref 0.50–1.35)
GFR calc Af Amer: >90 mL/min (ref >90)
GFR calc non Af Amer: >90 mL/min (ref >90)
Glucose, Bld: 186 mg/dL — ABNORMAL HIGH (ref 70–99)
POTASSIUM: 4.6 mmol/L (ref 3.5–5.1)
Sodium: 134 mmol/L — ABNORMAL LOW (ref 135–145)
TOTAL PROTEIN: 5.2 g/dL — AB (ref 6.0–8.3)
Total Bilirubin: 2.5 mg/dL — ABNORMAL HIGH (ref 0.3–1.2)

## 2014-06-08 LAB — MRSA PCR SCREENING: MRSA BY PCR: POSITIVE — AB

## 2014-06-08 LAB — CBC
HCT: 48.4 % (ref 39.0–52.0)
HEMOGLOBIN: 16.2 g/dL (ref 13.0–17.0)
MCH: 31.8 pg (ref 26.0–34.0)
MCHC: 33.5 g/dL (ref 30.0–36.0)
MCV: 94.9 fL (ref 78.0–100.0)
PLATELETS: 218 10*3/uL (ref 150–400)
RBC: 5.1 MIL/uL (ref 4.22–5.81)
RDW: 13.3 % (ref 11.5–15.5)
WBC: 13.2 10*3/uL — AB (ref 4.0–10.5)

## 2014-06-08 LAB — PROTIME-INR
INR: 1.23 (ref 0.00–1.49)
Prothrombin Time: 15.7 seconds — ABNORMAL HIGH (ref 11.6–15.2)

## 2014-06-08 MED ORDER — ONDANSETRON HCL 4 MG/2ML IJ SOLN: 4.0000 mg | Freq: Four times a day (QID) | INTRAMUSCULAR | Status: DC | PRN

## 2014-06-08 MED ORDER — MUPIROCIN 2 % EX OINT
1.0000 | TOPICAL_OINTMENT | Freq: Two times a day (BID) | CUTANEOUS | Status: AC
Start: 2014-06-08 — End: 2014-06-12
  Administered 2014-06-08 – 2014-06-12 (×10): 1 via NASAL
  Filled 2014-06-08 (×2): qty 22

## 2014-06-08 MED ORDER — DEXAMETHASONE SODIUM PHOSPHATE 4 MG/ML IJ SOLN
4.0000 mg | Freq: Two times a day (BID) | INTRAMUSCULAR | Status: DC
  Administered 2014-06-08 – 2014-06-14 (×12): 4 mg via INTRAVENOUS
  Filled 2014-06-08 (×13): qty 1

## 2014-06-08 MED ORDER — METHOCARBAMOL 1000 MG/10ML IJ SOLN
1000.0000 mg | Freq: Three times a day (TID) | INTRAVENOUS | Status: DC
  Administered 2014-06-08 – 2014-06-14 (×17): 1000 mg via INTRAVENOUS
  Filled 2014-06-08 (×20): qty 10

## 2014-06-08 MED ORDER — NALOXONE HCL 0.4 MG/ML IJ SOLN: 0.4000 mg | INTRAMUSCULAR | Status: DC | PRN

## 2014-06-08 MED ORDER — FENTANYL 10 MCG/ML IV SOLN
INTRAVENOUS | Status: DC
  Administered 2014-06-08: 10:00:00 via INTRAVENOUS
  Filled 2014-06-08: qty 50

## 2014-06-08 MED ORDER — HYDRALAZINE HCL 20 MG/ML IJ SOLN: 10.0000 mg | Freq: Four times a day (QID) | INTRAMUSCULAR | Status: DC | PRN

## 2014-06-08 MED ORDER — CHLORHEXIDINE GLUCONATE 0.12 % MT SOLN
15.0000 mL | Freq: Two times a day (BID) | OROMUCOSAL | Status: DC
  Administered 2014-06-08 – 2014-06-14 (×12): 15 mL via OROMUCOSAL
  Filled 2014-06-08 (×15): qty 15

## 2014-06-08 MED ORDER — FENTANYL 10 MCG/ML IV SOLN
INTRAVENOUS | Status: DC
  Administered 2014-06-08: 225 ug via INTRAVENOUS
  Administered 2014-06-08: 240 ug via INTRAVENOUS
  Administered 2014-06-08: 15:00:00 via INTRAVENOUS
  Administered 2014-06-08: 87 ug via INTRAVENOUS
  Administered 2014-06-08: 6 ug via INTRAVENOUS
  Administered 2014-06-09: 09:00:00 via INTRAVENOUS
  Administered 2014-06-09: 450 ug via INTRAVENOUS
  Administered 2014-06-09: 25.5 ug via INTRAVENOUS
  Administered 2014-06-09: 405 ug via INTRAVENOUS
  Filled 2014-06-08 (×5): qty 50

## 2014-06-08 MED ORDER — CHLORHEXIDINE GLUCONATE CLOTH 2 % EX PADS
6.0000 | MEDICATED_PAD | Freq: Every day | CUTANEOUS | Status: AC
  Administered 2014-06-08 – 2014-06-12 (×3): 6 via TOPICAL

## 2014-06-08 MED ORDER — DEXAMETHASONE SODIUM PHOSPHATE 4 MG/ML IJ SOLN
4.0000 mg | Freq: Four times a day (QID) | INTRAMUSCULAR | Status: DC
  Administered 2014-06-08 (×2): 4 mg via INTRAVENOUS
  Filled 2014-06-08 (×2): qty 1

## 2014-06-08 MED ORDER — PHENOL 1.4 % MT LIQD
1.0000 | OROMUCOSAL | Status: DC | PRN
  Administered 2014-06-08: 1 via OROMUCOSAL
  Filled 2014-06-08: qty 177

## 2014-06-08 MED ORDER — CETYLPYRIDINIUM CHLORIDE 0.05 % MT LIQD
7.0000 mL | Freq: Two times a day (BID) | OROMUCOSAL | Status: DC
  Administered 2014-06-08 – 2014-06-13 (×9): 7 mL via OROMUCOSAL

## 2014-06-08 MED ORDER — SODIUM CHLORIDE 0.9 % IJ SOLN: 9.0000 mL | INTRAMUSCULAR | Status: DC | PRN
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
## 2014-06-08 NOTE — Progress Notes (Signed)
[facility]  Patient Status: Active (receiving services up to time of hospitalization)  [facility] is providing the following services: RN, PT, OT and ST  If patient discharges after hours, please call [phone].   Lurlean Leyden 06/08/2014, 9:26 AM
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
## 2014-06-09 DIAGNOSIS — C801 Malignant (primary) neoplasm, unspecified: Secondary | ICD-10-CM

## 2014-06-09 DIAGNOSIS — Z9889 Other specified postprocedural states: Secondary | ICD-10-CM

## 2014-06-09 LAB — COMPREHENSIVE METABOLIC PANEL
ALK PHOS: 95 U/L (ref 39–117)
ALT: 133 U/L — ABNORMAL HIGH (ref 0–53)
ANION GAP: 8 (ref 5–15)
AST: 24 U/L (ref 0–37)
Albumin: 2.5 g/dL — ABNORMAL LOW (ref 3.5–5.2)
BILIRUBIN TOTAL: 1.3 mg/dL — AB (ref 0.3–1.2)
BUN: 21 mg/dL (ref 6–23)
CO2: 23 mmol/L (ref 19–32)
CREATININE: 0.61 mg/dL (ref 0.50–1.35)
Calcium: 8.3 mg/dL — ABNORMAL LOW (ref 8.4–10.5)
Chloride: 103 mmol/L (ref 96–112)
GFR calc Af Amer: >90 mL/min (ref >90)
Glucose, Bld: 138 mg/dL — ABNORMAL HIGH (ref 70–99)
Potassium: 4.5 mmol/L (ref 3.5–5.1)
SODIUM: 134 mmol/L — AB (ref 135–145)
Total Protein: 6.1 g/dL (ref 6.0–8.3)

## 2014-06-09 LAB — CBC
HCT: 42.4 % (ref 39.0–52.0)
Hemoglobin: 13.9 g/dL (ref 13.0–17.0)
MCH: 31.4 pg (ref 26.0–34.0)
MCHC: 32.8 g/dL (ref 30.0–36.0)
MCV: 95.7 fL (ref 78.0–100.0)
Platelets: 211 10*3/uL (ref 150–400)
RBC: 4.43 MIL/uL (ref 4.22–5.81)
RDW: 13.5 % (ref 11.5–15.5)
WBC: 11.5 10*3/uL — ABNORMAL HIGH (ref 4.0–10.5)

## 2014-06-09 LAB — URINE CULTURE
CULTURE: NO GROWTH
Colony Count: NO GROWTH

## 2014-06-09 LAB — VANCOMYCIN, TROUGH: Vancomycin Tr: 14 ug/mL (ref 10.0–20.0)

## 2014-06-09 MED ORDER — NALOXONE HCL 0.4 MG/ML IJ SOLN: 0.4000 mg | INTRAMUSCULAR | Status: DC | PRN

## 2014-06-09 MED ORDER — DIPHENHYDRAMINE HCL 12.5 MG/5ML PO ELIX: 12.5000 mg | ORAL_SOLUTION | Freq: Four times a day (QID) | ORAL | Status: DC | PRN

## 2014-06-09 MED ORDER — MUPIROCIN 2 % EX OINT: 1.0000 "application " | TOPICAL_OINTMENT | Freq: Two times a day (BID) | CUTANEOUS | Status: DC

## 2014-06-09 MED ORDER — HYDROMORPHONE 0.3 MG/ML IV SOLN
INTRAVENOUS | Status: DC
  Administered 2014-06-09: 25 mL via INTRAVENOUS
  Administered 2014-06-09: 3.29 mg via INTRAVENOUS
  Administered 2014-06-10: 3.16 mg via INTRAVENOUS
  Administered 2014-06-10: 3.6 mg via INTRAVENOUS
  Administered 2014-06-10 (×2): via INTRAVENOUS
  Administered 2014-06-10: 25 mL via INTRAVENOUS
  Administered 2014-06-10: 4.2 mg via INTRAVENOUS
  Administered 2014-06-10: 3 mg via INTRAVENOUS
  Administered 2014-06-11: 2.1 mg via INTRAVENOUS
  Administered 2014-06-11: 02:00:00 via INTRAVENOUS
  Administered 2014-06-11: 4.5 mg via INTRAVENOUS
  Administered 2014-06-11: 3.6 mg via INTRAVENOUS
  Filled 2014-06-09 (×5): qty 25

## 2014-06-09 MED ORDER — CHLORHEXIDINE GLUCONATE CLOTH 2 % EX PADS: 6.0000 | MEDICATED_PAD | Freq: Every day | CUTANEOUS | Status: DC

## 2014-06-09 MED ORDER — ONDANSETRON HCL 4 MG/2ML IJ SOLN: 4.0000 mg | Freq: Four times a day (QID) | INTRAMUSCULAR | Status: DC | PRN

## 2014-06-09 MED ORDER — DIPHENHYDRAMINE HCL 50 MG/ML IJ SOLN: 12.5000 mg | Freq: Four times a day (QID) | INTRAMUSCULAR | Status: DC | PRN

## 2014-06-09 MED ORDER — SODIUM CHLORIDE 0.9 % IJ SOLN: 9.0000 mL | INTRAMUSCULAR | Status: DC | PRN

## 2014-06-09 MED ORDER — CLONIDINE HCL 0.1 MG PO TABS
0.1000 mg | ORAL_TABLET | Freq: Three times a day (TID) | ORAL | Status: DC
  Administered 2014-06-09 – 2014-06-14 (×15): 0.1 mg via ORAL
  Filled 2014-06-09 (×18): qty 1

## 2014-06-09 NOTE — Care Management Note (Signed)
CARE MANAGEMENT NOTE 06/09/2014  Patient:  [name],[name]   Account Number:  [id number]  Date Initiated:  06/09/2014  Documentation initiated by:  Vern Prestia  Subjective/Objective Assessment:   perforation of bowel with sepsis     Action/Plan:   home when stable   Anticipated DC Date:  06/12/2014   Anticipated DC Plan:  HOME/SELF CARE  In-house referral  NA      DC Planning Services  CM consult      Choice offered to / List presented to:             Status of service:  In process, will continue to follow Medicare Important Message given?   (If response is "NO", the following Medicare IM given date fields will be blank) Date Medicare IM given:   Medicare IM given by:   Date Additional Medicare IM given:   Additional Medicare IM given by:    Discharge Disposition:    Per UR Regulation:  Reviewed for med. necessity/level of care/duration of stay  If discussed at [place] of Stay Meetings, dates discussed:    Comments:  June 09, 2014/Briceyda Abdullah L. Rosana Hoes, RN, BSN, CCM. Case Management [place] [phone] No discharge needs present of time of review.

## 2014-06-09 NOTE — Progress Notes (Addendum)
No distress No new complaints Pain is better controlled  Filed Vitals:   06/09/14 1834 06/09/14 1843 06/09/14 1950 06/09/14 2100  BP:  130/78  123/89  Pulse:  83  71  Temp:    97.8 F (36.6 C)  TempSrc:    Oral  Resp: '18 19 19 16  '$ Height:      Weight:      SpO2:   97% 96%   NAD RASS 0 Neuro without focal deficits Chest clear RRR Abd mildly tender, diminished BS Ext warm, no edema  BMET    Component Value Date/Time   NA 134* 06/09/2014 0513   K 4.5 06/09/2014 0513   CL 103 06/09/2014 0513   CO2 23 06/09/2014 0513   GLUCOSE 138* 06/09/2014 0513   BUN 21 06/09/2014 0513   CREATININE 0.61 06/09/2014 0513   CALCIUM 8.3* 06/09/2014 0513   GFRNONAA >90 06/09/2014 0513   GFRAA >90 06/09/2014 0513    CBC    Component Value Date/Time   WBC 11.5* 06/09/2014 0513   RBC 4.43 06/09/2014 0513   HGB 13.9 06/09/2014 0513   HCT 42.4 06/09/2014 0513   PLT 211 06/09/2014 0513   MCV 95.7 06/09/2014 0513   MCH 31.4 06/09/2014 0513   MCHC 32.8 06/09/2014 0513   RDW 13.5 06/09/2014 0513   LYMPHSABS 1.2 06/07/2014 1100   MONOABS 0.1 06/07/2014 1100   EOSABS 0.0 06/07/2014 1100   BASOSABS 0.0 06/07/2014 1100    CXR: NNF  IMPRESSION: Post laparotomy for bowel perforation  Uncomplicated post op course Small celll Ca, extensive inc brain metsastases   PLAN: Post op mgmt per [facility] Consider Onc input  PCCM will be available PRN. Please call  Merton Border, MD ; [facility] [phone].  After 5:30 PM or weekends, call [phone]
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
## 2014-06-09 NOTE — Plan of Care (Signed)
Problem: Phase II Progression Outcomes Goal: Progress activity as tolerated unless otherwise ordered Outcome: Progressing Ambulating around the unit x4
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
## 2014-06-10 ENCOUNTER — Ambulatory Visit
Admit: 2014-06-10 | Discharge: 2014-06-10 | Disposition: A | Discharge status: Home / Self Care | Payer: Medicaid Other | Attending: Radiation Oncology | Admitting: Radiation Oncology

## 2014-06-10 ENCOUNTER — Ambulatory Visit: Payer: Self-pay

## 2014-06-10 ENCOUNTER — Ambulatory Visit: Payer: Self-pay | Admitting: Hematology and Oncology

## 2014-06-10 ENCOUNTER — Telehealth: Payer: Self-pay | Admitting: Hematology and Oncology

## 2014-06-10 ENCOUNTER — Ambulatory Visit: Payer: Medicaid Other

## 2014-06-10 ENCOUNTER — Telehealth: Payer: Self-pay | Admitting: Oncology

## 2014-06-10 ENCOUNTER — Encounter (HOSPITAL_COMMUNITY): Payer: Self-pay | Admitting: Surgery

## 2014-06-10 LAB — BASIC METABOLIC PANEL
Anion gap: 7 (ref 5–15)
BUN: 24 mg/dL — ABNORMAL HIGH (ref 6–23)
CALCIUM: 8.1 mg/dL — AB (ref 8.4–10.5)
CHLORIDE: 98 mmol/L (ref 96–112)
CO2: 24 mmol/L (ref 19–32)
CREATININE: 0.55 mg/dL (ref 0.50–1.35)
GFR calc non Af Amer: >90 mL/min (ref >90)
GLUCOSE: 105 mg/dL — AB (ref 70–99)
Potassium: 4.3 mmol/L (ref 3.5–5.1)
SODIUM: 129 mmol/L — AB (ref 135–145)

## 2014-06-10 LAB — CBC
HEMATOCRIT: 38.5 % — AB (ref 39.0–52.0)
HEMOGLOBIN: 13.1 g/dL (ref 13.0–17.0)
MCH: 32.1 pg (ref 26.0–34.0)
MCHC: 34 g/dL (ref 30.0–36.0)
MCV: 94.4 fL (ref 78.0–100.0)
PLATELETS: 208 10*3/uL (ref 150–400)
RBC: 4.08 MIL/uL — ABNORMAL LOW (ref 4.22–5.81)
RDW: 13.1 % (ref 11.5–15.5)
WBC: 10.2 10*3/uL (ref 4.0–10.5)

## 2014-06-10 LAB — MAGNESIUM: Magnesium: 2.2 mg/dL (ref 1.5–2.5)

## 2014-06-10 NOTE — Telephone Encounter (Signed)
HOSP F/U APPT- CALLED PATIENT RM 1338 AND GAVE HOSP F/U FOR 05/06 @ 10:15 W/DR. [place]

## 2014-06-10 NOTE — Consult Note (Signed)
WOC ostomy consult note Stoma type/location: Bowel perforation with LLQ Colostomy Stomal assessment/size: 1 3/4" round pink and moist.  Well budded.  Edematous.  Suture line protected with barrier ring. Peristomal assessment: Barrier ring/ bruising noted to peristomal skin.  Treatment options for stomal/peristomal skin: Barrier ring to protect suture line at mucocutaneous junction.  Output Bloody liquid only Ostomy pouching: 1pc. 2 1/4" flat with a barrier ring.  Education provided:  Patient indicates that his fiancee, Erasmo Downer will be providing care after discharge.  She is not here today.  Pouch is leaking slightly today, and will be changed.  Ongoing education tomorrow with fiancee.  Pouch is cut to fit and barrier ring applied.  Explained rationale for barrier ring to protect suture line.  Stoma is edematous and sutures are taut today.  Peristomal cleansing discussed today.  Patient able to complete roll closure.  Education will continue tomorrow.  [employer] team will continue to follow.  Domenic Moras RN BSN [place] Pager [phone]
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
## 2014-06-10 NOTE — Progress Notes (Addendum)
Giving report to the night nurse, pt states his scrotum is swollen .Gwen the night nurse will elevate the scrotum. No residual from NG tube and no out put. NG dc'd at 1730. Abd.incision draining blood, ABD dressing changed.
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
## 2014-06-10 NOTE — Telephone Encounter (Signed)
Called 3 [place] for report on [name].  Per [name], he is feeling better today.  He has been up walking around the unit.  He has a dilaudid PCA pump for pan.

## 2014-06-11 ENCOUNTER — Ambulatory Visit: Payer: Medicaid Other

## 2014-06-11 ENCOUNTER — Telehealth: Payer: Self-pay | Admitting: Oncology

## 2014-06-11 ENCOUNTER — Ambulatory Visit
Admission: RE | Admit: 2014-06-11 | Discharge: 2014-06-11 | Disposition: A | Discharge status: Home / Self Care | Payer: Medicaid Other | Source: Ambulatory Visit | Attending: Radiation Oncology | Admitting: Radiation Oncology

## 2014-06-11 ENCOUNTER — Encounter: Payer: Self-pay | Admitting: Radiation Oncology

## 2014-06-11 ENCOUNTER — Ambulatory Visit: Payer: Self-pay | Admitting: Internal Medicine

## 2014-06-11 ENCOUNTER — Ambulatory Visit
Admission: RE | Admit: 2014-06-11 | Discharge: 2014-06-11 | Disposition: A | Discharge status: Home / Self Care | Payer: No Typology Code available for payment source | Source: Ambulatory Visit | Attending: Radiation Oncology | Admitting: Radiation Oncology

## 2014-06-11 VITALS — BP 154/103 | HR 71 | Temp 97.6°F | Resp 20 | Wt 192.0 lb

## 2014-06-11 DIAGNOSIS — C3491 Malignant neoplasm of unspecified part of right bronchus or lung: Secondary | ICD-10-CM

## 2014-06-11 MED ORDER — OXYCODONE HCL 5 MG/5ML PO SOLN
5.0000 mg | ORAL | Status: DC | PRN
  Administered 2014-06-11 – 2014-06-12 (×6): 5 mg via ORAL
  Filled 2014-06-11 (×2): qty 25
  Filled 2014-06-11 (×2): qty 5
  Filled 2014-06-11: qty 25
  Filled 2014-06-11: qty 5
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
## 2014-06-11 NOTE — Telephone Encounter (Signed)
Tawanna Solo, RN on [address] for report.  Per Suzie Portela is doing well and should be able to come down for radiation today.  She said he is able to lay flat.  His NG tube was taken out last night.  He is on a dilaudid PCA pump.  Notified Emily, RT on linac 1.

## 2014-06-11 NOTE — Consult Note (Signed)
WOC ostomy follow up Stoma type/location: LLQ Colostomy with pouching system intact. Stomal assessment/size: Visualized through pouch, red, moist Peristomal assessment: Not seen today. Treatment options for stomal/peristomal skin: None indicated.  Skin barrier ring is in place beneath skin barrier. Output: Scant serosanguinous effluent. Ostomy pouching: 2pc., 2 and 1/4 inch pouching system applied yesterday is intact today. Enrolled patient in [place] Start Discharge program: No Education provided: None today. I did not see fiancee today; unfortunately, when I visited at 12:30pm they were in radiation oncology and she stated that must leave immediately after.  She has a 45-year old child and is not able to visit tomorrow until after 6pm.  She requests a visit on Thursday morning; she is able to be here at [place].  If pouch remains intact, it is ideal for the educational session to include a pouch change. Patient will begin mobilizing more today in hopes of opening colostomy. Patient is starting full liquids today as he has tolerated clear liquids well.  It would be helpful for patient to have [place] to continue ostomy teaching at home and support the family at this time.  If you agree, please order. I am not here until Friday; I will ask one of my partners to follow in my absence. [place] nursing team will not follow, but will remain available to this patient, the nursing and medical team.  Please re-consult if needed. Thanks, Maudie Flakes, MSN, RN, [place], [place], [place] [phone])

## 2014-06-11 NOTE — Progress Notes (Signed)
Dilaudid PCa d/c'd per order,syringe was empty.

## 2014-06-11 NOTE — Progress Notes (Signed)
  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 . . . . . . . The patient presents for routine under treatment assessment.                                   The patient is without complaint. He denies any nausea or headaches. Late Thursday evening the patient developed severe abdominal pain. He ultimately was found to have bowel perforation and underwent emergency surgery. He does have a colostomy in place in light of this issue. He has recovered quickly from his surgery. He continues on multiple antibiotics for this issue.                                 Set-up films were reviewed.                                 The chart was checked. Physical Findings. . .  weight is 192 lb (87.091 kg). His oral temperature is 97.6 F (36.4 C). His blood pressure is 154/103 and his pulse is 71. His respiration is 20 and oxygen saturation is 97%. . Patient is alert and oriented. He does show hair loss at this time. No secondary infection in the oral cavity. The lungs are clear. The heart has a regular rhythm and rate. Colostomy in place in the abdominal region which appears to be functioning well. Patient does have some scrotal edema  Impression . . . . . . . The patient is tolerating radiation. Plan . . . . . . . . . . . . Continue treatment as planned. He has 3 more treatments remaining  ________________________________   Blair Promise, PhD, MD

## 2014-06-11 NOTE — Progress Notes (Addendum)
Weekly rad txs whole brain   11/14 completd, IVF'S ongoing right neck,        , intact, no redness at site, wears neck collar in w/c, erythema on face and scalp, using biafine bid, hoarseness, clear coughing, not smoking, off dilaudid pca, pain 9/10 back stated, appetite good, colostomy stoma loks good, bag empty, drinking liquids,  Swelling lower extremities 12:54 PM  BP 154/103 mmHg  Pulse 71  Temp(Src) 97.6 F (36.4 C) (Oral)  Resp 20  Wt 192 lb (87.091 kg)  SpO2 97%  Wt Readings from Last 3 Encounters:  06/09/14 186 lb 8 oz (84.596 kg)  06/11/14 192 lb (87.091 kg)  05/31/14 190 lb 6.4 oz (86.365 kg)

## 2014-06-12 ENCOUNTER — Ambulatory Visit: Payer: Medicaid Other

## 2014-06-12 ENCOUNTER — Ambulatory Visit
Admission: RE | Admit: 2014-06-12 | Discharge: 2014-06-12 | Disposition: A | Discharge status: Home / Self Care | Payer: Medicaid Other | Source: Ambulatory Visit | Attending: Radiation Oncology | Admitting: Radiation Oncology

## 2014-06-12 LAB — BASIC METABOLIC PANEL
ANION GAP: 11 (ref 5–15)
BUN: 17 mg/dL (ref 6–23)
CO2: 26 mmol/L (ref 19–32)
CREATININE: 0.63 mg/dL (ref 0.50–1.35)
Calcium: 8.6 mg/dL (ref 8.4–10.5)
Chloride: 98 mmol/L (ref 96–112)
GFR calc non Af Amer: >90 mL/min (ref >90)
Glucose, Bld: 99 mg/dL (ref 70–99)
POTASSIUM: 3.9 mmol/L (ref 3.5–5.1)
SODIUM: 135 mmol/L (ref 135–145)

## 2014-06-12 LAB — ANAEROBIC CULTURE

## 2014-06-12 LAB — BODY FLUID CULTURE

## 2014-06-12 LAB — CBC
HEMATOCRIT: 39.9 % (ref 39.0–52.0)
Hemoglobin: 13.4 g/dL (ref 13.0–17.0)
MCH: 31.2 pg (ref 26.0–34.0)
MCHC: 33.6 g/dL (ref 30.0–36.0)
MCV: 93 fL (ref 78.0–100.0)
Platelets: 227 10*3/uL (ref 150–400)
RBC: 4.29 MIL/uL (ref 4.22–5.81)
RDW: 12.6 % (ref 11.5–15.5)
WBC: 8.7 10*3/uL (ref 4.0–10.5)

## 2014-06-12 LAB — GLUCOSE, CAPILLARY: Glucose-Capillary: 87 mg/dL (ref 70–99)

## 2014-06-12 MED ORDER — POLYETHYLENE GLYCOL 3350 17 G PO PACK
17.0000 g | PACK | Freq: Every day | ORAL | Status: DC
  Administered 2014-06-12 – 2014-06-14 (×3): 17 g via ORAL
  Filled 2014-06-12 (×3): qty 1

## 2014-06-12 MED ORDER — HYDRALAZINE HCL 20 MG/ML IJ SOLN: 10.0000 mg | Freq: Four times a day (QID) | INTRAMUSCULAR | Status: DC | PRN

## 2014-06-12 MED ORDER — FUROSEMIDE 10 MG/ML IJ SOLN
40.0000 mg | Freq: Once | INTRAMUSCULAR | Status: AC
  Administered 2014-06-12: 40 mg via INTRAVENOUS
  Filled 2014-06-12: qty 4

## 2014-06-12 MED ORDER — OXYCODONE HCL 5 MG/5ML PO SOLN
10.0000 mg | ORAL | Status: DC | PRN
  Administered 2014-06-12 (×2): 15 mg via ORAL
  Administered 2014-06-12: 10 mg via ORAL
  Administered 2014-06-13: 15 mg via ORAL
  Filled 2014-06-12 (×4): qty 15
  Filled 2014-06-12: qty 10

## 2014-06-12 MED ORDER — FUROSEMIDE 20 MG PO TABS
20.0000 mg | ORAL_TABLET | Freq: Every day | ORAL | Status: DC
  Filled 2014-06-12: qty 1

## 2014-06-12 NOTE — Plan of Care (Signed)
Problem: Phase II Progression Outcomes Goal: Pain controlled Outcome: Not Progressing Pt requiring PO and IV pain medicine for pain control.  Goal: Progress activity as tolerated unless otherwise ordered Outcome: Progressing Pt ambulating in halls multiple times.

## 2014-06-12 NOTE — Care Management Note (Signed)
CARE MANAGEMENT NOTE 06/12/2014  Patient:  [name],[name]   Account Number:  [id number]  Date Initiated:  06/09/2014  Documentation initiated by:  DAVIS,RHONDA  Subjective/Objective Assessment:   perforation of bowel with sepsis     Action/Plan:   home when stable   Anticipated DC Date:  06/14/2014   Anticipated DC Plan:  [place]  In-house referral  NA      DC Planning Services  CM consult      Choice offered to / List presented to:          [facility] arranged  HH-1 RN  [place].   Status of service:  In process, will continue to follow Medicare Important Message given?   (If response is "NO", the following Medicare IM given date fields will be blank) Date Medicare IM given:   Medicare IM given by:   Date Additional Medicare IM given:   Additional Medicare IM given by:    Discharge Disposition:    Per UR Regulation:  Reviewed for med. necessity/level of care/duration of stay  If discussed at [place] of Stay Meetings, dates discussed:    Comments:  06/12/14 Marney Doctor RN,BSN,NCM [phone] Met with pt at bedside who states he had [facility] services prior to admission and would like to continue with them at DC. Will need MD order for resumption of services (RN/PT/OT/ST). [facility] rep aware pt in hospital and following. CM will assist with DC needs.  June 09, 2014/Rhonda L. Rosana Hoes, RN, BSN, CCM. Case Management [place] [phone] No discharge needs present of time of review.    Lynnell Catalan, RN 06/12/2014, 12:12 PM
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
## 2014-06-12 NOTE — Progress Notes (Signed)
OT Cancellation Note  Patient Details Name: [name] MRN: [id number] DOB: Apr 25, 1970   Cancelled Treatment:    Reason Eval/Treat Not Completed: Other (comment).  Pt currently having colostomy care.  Will check another time.  Tabbatha Bordelon 06/12/2014, 3:28 PM  Lesle Chris, OTR/L [phone] 06/12/2014

## 2014-06-12 NOTE — Progress Notes (Signed)
[place] Radiation Oncology Dept Therapy Treatment Record Phone [phone]   Radiation Therapy was administered to [name] on: 06/12/2014  11:19 AM and was treatment # 12out of a planned course of 14 treatments.

## 2014-06-13 ENCOUNTER — Ambulatory Visit: Payer: Medicaid Other

## 2014-06-13 ENCOUNTER — Ambulatory Visit
Admission: RE | Admit: 2014-06-13 | Discharge: 2014-06-13 | Disposition: A | Discharge status: Home / Self Care | Payer: Medicaid Other | Source: Ambulatory Visit | Attending: Radiation Oncology | Admitting: Radiation Oncology

## 2014-06-13 LAB — CULTURE, BLOOD (ROUTINE X 2)
Culture: NO GROWTH
Culture: NO GROWTH

## 2014-06-13 LAB — BASIC METABOLIC PANEL
ANION GAP: 9 (ref 5–15)
BUN: 13 mg/dL (ref 6–23)
CO2: 27 mmol/L (ref 19–32)
Calcium: 8.3 mg/dL — ABNORMAL LOW (ref 8.4–10.5)
Chloride: 96 mmol/L (ref 96–112)
Creatinine, Ser: 0.69 mg/dL (ref 0.50–1.35)
GFR calc Af Amer: >90 mL/min (ref >90)
Glucose, Bld: 131 mg/dL — ABNORMAL HIGH (ref 70–99)
Potassium: 3.9 mmol/L (ref 3.5–5.1)
SODIUM: 132 mmol/L — AB (ref 135–145)

## 2014-06-13 LAB — GLUCOSE, CAPILLARY
GLUCOSE-CAPILLARY: 113 mg/dL — AB (ref 70–99)
Glucose-Capillary: 86 mg/dL (ref 70–99)
Glucose-Capillary: 110 mg/dL — ABNORMAL HIGH (ref 70–99)

## 2014-06-13 MED ORDER — ACETAMINOPHEN 325 MG PO TABS: 325.0000 mg | ORAL_TABLET | Freq: Four times a day (QID) | ORAL | Status: DC | PRN

## 2014-06-13 MED ORDER — FUROSEMIDE 20 MG PO TABS
20.0000 mg | ORAL_TABLET | Freq: Every day | ORAL | Status: DC
  Administered 2014-06-14: 20 mg via ORAL
  Filled 2014-06-13: qty 1

## 2014-06-13 MED ORDER — FUROSEMIDE 10 MG/ML IJ SOLN
40.0000 mg | Freq: Once | INTRAMUSCULAR | Status: AC
  Administered 2014-06-13: 40 mg via INTRAVENOUS
  Filled 2014-06-13: qty 4

## 2014-06-13 MED ORDER — HYDROMORPHONE HCL 1 MG/ML IJ SOLN
0.5000 mg | INTRAMUSCULAR | Status: DC | PRN
  Administered 2014-06-13: 1 mg via INTRAVENOUS
  Filled 2014-06-13: qty 1

## 2014-06-13 MED ORDER — OXYCODONE HCL 5 MG/5ML PO SOLN: 10.0000 mg | ORAL | Status: DC | PRN

## 2014-06-13 MED ORDER — OXYCODONE HCL 5 MG/5ML PO SOLN
10.0000 mg | ORAL | Status: DC | PRN
  Administered 2014-06-13 – 2014-06-14 (×7): 20 mg via ORAL
  Filled 2014-06-13 (×2): qty 20
  Filled 2014-06-13: qty 10
  Filled 2014-06-13 (×2): qty 20
  Filled 2014-06-13: qty 10
  Filled 2014-06-13 (×2): qty 20

## 2014-06-13 NOTE — Discharge Instructions (Signed)
Ostomy Support Information  Yes, Theresia Majors heard that people get along just fine with only one of their eyes, or one of their lungs, or one of their kidneys. But you also know that you have only one intestine and only one bladder, and that leaves you feeling awfully empty, both physically and emotionally: You think no other people go around without part of their intestine with the ends of their intestines sticking out through their abdominal walls.  Well, you are wrong! There are nearly three quarters of a million people in the [place] who have an ostomy; people who have had surgery to remove all or part of their colons or bladders. There is even a national association, the [place] [employer] [place] with over 350 local affiliated support groups that are organized by volunteers who provide peer support and counseling. Juan Quam has a toll free telephone num-ber, ([phone] and an educational,  interactive website, www.ostomy.org   An ostomy is an opening in the belly (abdominal wall) made by surgery. Ostomates are people who have had this procedure. The opening (stoma) allows the kidney or bowel to discharge waste. An external pouch covers the stoma to collect waste. Pouches are are a simple bag and are odor free. Different companies have disposable or reusable pouches to fit one's lifestyle. An ostomy can either be temporary or permanent.  THERE ARE THREE MAIN TYPES OF OSTOMIES  Colostomy. A colostomy is a surgically created opening in the large intestine (colon).  Ileostomy. An ileostomy is a surgically created opening in the small intestine.  Urostomy. A urostomy is a surgically created opening to divert urine away from the bladder. FREQUENTLY ASKED QUESTIONS   Why havent you met any of these folks who have an ostomy?  Well, maybe you have! You just did not recognize them because an ostomy doesn't show. It can be kept secret if you wish. Why, maybe some of your best friends, office associates  or neighbors have an ostomy ... you never can tell.   People facing ostomy surgery have many quality-of-life questions like:  Will you bulge? Smell? Make noises? Will you feel waste leaving your body? Will you be a captive of the toilet? Will you starve? Be a social outcast? Get/stay married? Have babies? Easily bathe, go swimming, bend over?  OK, lets look at what you can expect:  Will you bulge?  Remember, without part of the intestine or bladder, and its contents, you should have a flatter tummy than before. You can expect to wear, with little exception, what you wore before surgery ... and this in-cludes tight clothing and bathing suits.  Will you smell?  Today, thanks to modern odor proof pouching systems, you can walk into an ostomy support group meeting and not smell anything that is foul or offensive. And, for those with an ileostomy or colostomy who are concerned about odor when emptying their pouch, there are in-pouch deodorants that can be used to eliminate any waste odors that may exist.  Will you make noises?  Everyone produces gas, especially if they are an air-swallower. But intestinal sounds that occur from time to time are no differ-ent than a gurgling tummy, and quite often your clothing will muffle any sounds.   Will you feel the waste discharges?  For those with a colostomy or ileostomy there might be a slight pressure when waste leaves your body, but understand that the intestines have no nerve endings, so there will be no unpleasant sensations. Those with a urostomy will  probably be unaware of any kidney drainage.  Will you be a captive of the toilet?  Immediately post-op you will spend more time in the bathroom than you will after your body recovers from surgery. Every person is different, but on average those with an ileostomy or urostomy may empty their pouches 4 to 6 times a day; a little  less if you have a colostomy. The average wear time between pouch system changes is 3  to 5 days and the changing process should take less than 30 minutes.  Will I need to be on a special diet? Most people return to their normal diet when they have recovered from surgery. Be sure to chew your food well, eat a well-balanced diet and drink plenty of fluids. If you experience problems with a certain food, wait a couple of weeks and try it again. Will there be odor and noises? Pouching systems are designed to be odor-proof or odor-resistant. There are deodorants that can be used in the pouch. Medications are also available to help reduce odor. Limit gas-producing foods and carbonated beverages. You will experience less gas and fewer noises as you heal from surgery. How much time will it take to care for my ostomy? At first, you may spend a lot of time learning about your ostomy and how to take care of it. As you become more comfortable and skilled at changing the pouching system, it will take very little time to care for it.  Will I be able to return to work? People with ostomies can perform most jobs. As soon as you have healed from surgery, you should be able to return to work. Heavy lifting (more than 10 pounds) may be discouraged.  What about intimacy? Sexual relationships and intimacy are important and fulfilling aspects of your life. They should continue after ostomy surgery. Intimacy-related concerns should be discussed openly between you and your partner.  Can I wear regular clothing? You do not need to wear special clothing. Ostomy pouches are fairly flat and barely noticeable. Elastic undergarments will not hurt the stoma or prevent the ostomy from functioning.  Can I participate in sports? An ostomy should not limit your involvement in sports. Many people with ostomies are runners, skiers, swimmers or participate in other active lifestyles. Talk with your caregiver first before doing heavy physical activity.  Will you starve?  Not if you follow doctors orders at each stage of  your post-op adjustment. There is no such thing as an ostomy diet. Some people with an ostomy will be able to eat and tolerate anything; others may find diffi-culty with some foods. Each person is an individual and must determine, by trial, what is best for them. A good practice for all is to drink plenty of water.  Will you be a social outcast?  Have you met anyone who has an ostomy and is a social outcast? Why should you be the first? Only your attitude and self image will effect how you are treated. No confi-dent person is an Occupational psychologist.   PROFESSIONAL HELP  Resources are available if you need help or have questions about your ostomy.    Specially trained nurses called Wound, Ostomy Continence Nurses (WOCN) are available for consultation in most major medical centers.   Consider getting an ostomy consult with Cena Benton at [facility] to help troubleshoot stoma pouch fittings and other issues with your ostomy: [phone]   The [employer] ([employer]) is a group made up of many  local chapters throughout the [place]. These local groups hold meetings and provide support to prospective and existing ostomates. They sponsor educational events and have qualified visitors to make personal or telephone visits. Contact the UOA for the chapter nearest you and for other educational publications.  More detailed information can be found in Colostomy Guide, a publication of the [employer] (UOA). Contact UOA at [phone] or visit their web site at [email or web address] The website contains links to other sites, suppliers and resources. Document Released: 02/04/2003 Document Revised: 04/26/2011 Document Reviewed: 06/05/2008 [facility] Patient Information 2013 [place].  GETTING TO GOOD BOWEL HEALTH. Irregular bowel habits such as constipation and diarrhea can lead to many problems over time.  Having one soft bowel movement a day is the most important way to  prevent further problems.  The anorectal canal is designed to handle stretching and feces to safely manage our ability to get rid of solid waste (feces, poop, stool) out of our body.  BUT, hard constipated stools can act like ripping concrete bricks and diarrhea can be a burning fire to this very sensitive area of our body, causing inflamed hemorrhoids, anal fissures, increasing risk is perirectal abscesses, abdominal pain/bloating, an making irritable bowel worse.     The goal: ONE SOFT BOWEL MOVEMENT A DAY!  To have soft, regular bowel movements:   Drink at least 8 tall glasses of water a day.    Take plenty of fiber.  Fiber is the undigested part of plant food that passes into the colon, acting s natures broom to encourage bowel motility and movement.  Fiber can absorb and hold large amounts of water. This results in a larger, bulkier stool, which is soft and easier to pass. Work gradually over several weeks up to 6 servings a day of fiber (25g a day even more if needed) in the form of: o Vegetables -- Root (potatoes, carrots, turnips), leafy green (lettuce, salad greens, celery, spinach), or cooked high residue (cabbage, broccoli, etc) o Fruit -- Fresh (unpeeled skin & pulp), Dried (prunes, apricots, cherries, etc ),  or stewed ( applesauce)  o Whole grain breads, pasta, etc (whole wheat)  o Bran cereals   Bulking Agents -- This type of water-retaining fiber generally is easily obtained each day by one of the following:  o Psyllium bran -- The psyllium plant is remarkable because its ground seeds can retain so much water. This product is available as Metamucil, Konsyl, Effersyllium, Per Diem Fiber, or the less expensive generic preparation in drug and health food stores. Although labeled a laxative, it really is not a laxative.  o Methylcellulose -- This is another fiber derived from wood which also retains water. It is available as Citrucel. o Polyethylene Glycol - and artificial fiber  commonly called Miralax or Glycolax.  It is helpful for people with gassy or bloated feelings with regular fiber o Flax Seed - a less gassy fiber than psyllium  No reading or other relaxing activity while on the toilet. If bowel movements take longer than 5 minutes, you are too constipated  AVOID CONSTIPATION.  High fiber and water intake usually takes care of this.  Sometimes a laxative is needed to stimulate more frequent bowel movements, but   Laxatives are not a good long-term solution as it can wear the colon out. o Osmotics (Milk of Magnesia, Fleets phosphosoda, Magnesium citrate, MiraLax, GoLytely) are safer than  o Stimulants (Senokot, Castor Oil, Dulcolax, Ex Lax)    o Do not take  laxatives for more than 7days in a row.   IF SEVERELY CONSTIPATED, try a Bowel Retraining Program: o Do not use laxatives.  o Eat a diet high in roughage, such as bran cereals and leafy vegetables.  o Drink six (6) ounces of prune or apricot juice each morning.  o Eat two (2) large servings of stewed fruit each day.  o Take one (1) heaping tablespoon of a psyllium-based bulking agent twice a day. Use sugar-free sweetener when possible to avoid excessive calories.  o Eat a normal breakfast.  o Set aside 15 minutes after breakfast to sit on the toilet, but do not strain to have a bowel movement.  o If you do not have a bowel movement by the third day, use an enema and repeat the above steps.   Controlling diarrhea o Switch to liquids and simpler foods for a few days to avoid stressing your intestines further. o Avoid dairy products (especially milk & ice cream) for a short time.  The intestines often can lose the ability to digest lactose when stressed. o Avoid foods that cause gassiness or bloating.  Typical foods include beans and other legumes, cabbage, broccoli, and dairy foods.  Every person has some sensitivity to other foods, so listen to our body and avoid those foods that trigger problems for  you. o Adding fiber (Citrucel, Metamucil, psyllium, Miralax) gradually can help thicken stools by absorbing excess fluid and retrain the intestines to act more normally.  Slowly increase the dose over a few weeks.  Too much fiber too soon can backfire and cause cramping & bloating. o Probiotics (such as active yogurt, Align, etc) may help repopulate the intestines and colon with normal bacteria and calm down a sensitive digestive tract.  Most studies show it to be of mild help, though, and such products can be costly. o Medicines: - Bismuth subsalicylate (ex. Kayopectate, Pepto Bismol) every 30 minutes for up to 6 doses can help control diarrhea.  Avoid if pregnant. - Loperamide (Immodium) can slow down diarrhea.  Start with two tablets (60m total) first and then try one tablet every 6 hours.  Avoid if you are having fevers or severe pain.  If you are not better or start feeling worse, stop all medicines and call your doctor for advice o Call your doctor if you are getting worse or not better.  Sometimes further testing (cultures, endoscopy, X-ray studies, bloodwork, etc) may be needed to help diagnose and treat the cause of the diarrhea.   WOUND CARE  It is important that the wound be kept open.   -Keeping the skin edges apart will allow the wound to gradually heal from the base upwards.   - If the skin edges of the wound close too early, a new fluid pocket can form and infection can occur. -This is the reason to pack deeper wounds with gauze or ribbon -This is why drained wounds cannot be sewed closed right away  A healthy wound should form a lining of bright red "beefy" granulating tissue that will help shrink the wound and help the edges grow new skin into it.   -A little mucus / yellow discharge is normal (the body's natural way to try and form a scab) and should be gently washed off with soap and water with daily dressing changes.  -Green or foul smelling drainage implies bacterial  colonization and can slow wound healing - a short course of antibiotic ointment (3-5 days) can help it clear up.  Call the doctor if it does not improve or worsens  -Avoid use of antibiotic ointments for more than a week as they can slow wound healing over time.    -Sometimes other wound care products will be used to reduce need for dressing changes and/or help clean up dirty wounds -Sometimes the surgeon needs to debride the wound in the office to remove dead or infected tissue out of the wound so it can heal more quickly and safely.    Change the dressing at least once a day -Wash the wound with mild soap and water gently every day.  It is good to shower or bathe the wound to help it clean out. -Use clean 4x4 gauze for medium/large wounds or ribbon plain NU-gauze for smaller wounds (it does not need to be sterile, just clean) -Keep the raw wound moist with a little saline or KY (saline) gel on the gauze.  -A dry wound will take longer to heal.  -Keep the skin dry around the wound to prevent breakdown and irritation. -Pack the wound down to the base -The goal is to keep the skin apart, not overpack the wound -Use a Q-tip or blunt-tipped kabob stick toothpick to push the gauze down to the base in narrow or deep wounds   -Cover with a clean gauze and tape -paper or Medipore tape tend to be gentle on the skin -rotate the orientation of the tape to avoid repeated stress/trauma on the skin -using an ACE or Coban wrap on wounds on arms or legs can be used instead.  Complete all antibiotics through the entire prescription to help the infection heal and prevent new places of infection   Returning the see the surgeon is helpful to follow the healing process and help the wound close as fast as possible.   Managing Pain  Pain after surgery or related to activity is often due to strain/injury to muscle, tendon, nerves and/or incisions.  This pain is usually short-term and will improve in a few months.    Many people find it helpful to do the following things TOGETHER to help speed the process of healing and to get back to regular activity more quickly:  1. Avoid heavy physical activity at first a. No lifting greater than 20 pounds at first, then increase to lifting as tolerated over the next few weeks b. Do not push through the pain.  Listen to your body and avoid positions and maneuvers than reproduce the pain.  Wait a few days before trying something more intense c. Walking is okay as tolerated, but go slowly and stop when getting sore.  If you can walk 30 minutes without stopping or pain, you can try more intense activity (running, jogging, aerobics, cycling, swimming, treadmill, sex, sports, weightlifting, etc ) d. Remember: If it hurts to do it, then dont do it!  2. Take Anti-inflammatory medication a. Choose ONE of the following over-the-counter medications: i.            Acetaminophen 58m tabs (Tylenol) 1-2 pills with every meal and just before bedtime (avoid if you have liver problems) ii.            Naproxen 223mtabs (ex. Aleve) 1-2 pills twice a day (avoid if you have kidney, stomach, IBD, or bleeding problems) iii. Ibuprofen 20052mabs (ex. Advil, Motrin) 3-4 pills with every meal and just before bedtime (avoid if you have kidney, stomach, IBD, or bleeding problems) b. Take with food/snack around the clock for 1-2  weeks i. This helps the muscle and nerve tissues become less irritable and calm down faster  3. Use a Heating pad or Ice/Cold Pack a. 4-6 times a day b. May use warm bath/hottub  or showers  4. Try Gentle Massage and/or Stretching  a. at the area of pain many times a day b. stop if you feel pain - do not overdo it  Try these steps together to help you body heal faster and avoid making things get worse.  Doing just one of these things may not be enough.    If you are not getting better after two weeks or are noticing you are getting worse, contact our office  for further advice; we may need to re-evaluate you & see what other things we can do to help.
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
## 2014-06-13 NOTE — Consult Note (Addendum)
WOC ostomy follow up Abd wound followed by [facility] for assessment and plan of care. Stoma type/location: LLQ Colostomy with pouching system intact. Stomal assessment/size: Red and viable, above skin level, 1 1/2 inches.  More swollen than previous consult. Peristomal assessment: Intact skin surrounding Output: Mod amt brown unformed stool in pouch Ostomy pouching: 2pc., 2 3/4 inch pouching system applied  Enrolled patient in [employer] Discharge program: Yes Education provided: Demonstrated pouch change using 2 piece system and barrier ring to maintain seal.  Pt states he has been independent with pouch emptying.  Girlfriend assisted with pouch application and is able to open and close velcro to empty.  4  Sets of supplies at bedside and educational materials given to patient.  He asks appropriate questions and participated in pouch applications, he states he will have home health assistance with pouching activities. Please re-consult if further assistance is needed.  Thank-you,  Julien Girt MSN, [place], [place], [place], [place]
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
## 2014-06-14 ENCOUNTER — Encounter: Payer: Self-pay | Admitting: Radiation Oncology

## 2014-06-14 ENCOUNTER — Ambulatory Visit: Payer: Medicaid Other

## 2014-06-14 ENCOUNTER — Ambulatory Visit
Admission: RE | Admit: 2014-06-14 | Discharge: 2014-06-14 | Disposition: A | Discharge status: Home / Self Care | Payer: Medicaid Other | Source: Ambulatory Visit | Attending: Radiation Oncology | Admitting: Radiation Oncology

## 2014-06-14 LAB — BASIC METABOLIC PANEL
Anion gap: 5 (ref 5–15)
BUN: 12 mg/dL (ref 6–23)
CALCIUM: 7.9 mg/dL — AB (ref 8.4–10.5)
CO2: 27 mmol/L (ref 19–32)
CREATININE: 0.66 mg/dL (ref 0.50–1.35)
Chloride: 101 mmol/L (ref 96–112)
GFR calc Af Amer: >90 mL/min (ref >90)
GFR calc non Af Amer: >90 mL/min (ref >90)
Glucose, Bld: 113 mg/dL — ABNORMAL HIGH (ref 70–99)
Potassium: 3.5 mmol/L (ref 3.5–5.1)
Sodium: 133 mmol/L — ABNORMAL LOW (ref 135–145)

## 2014-06-14 LAB — GLUCOSE, CAPILLARY
GLUCOSE-CAPILLARY: 112 mg/dL — AB (ref 70–99)
Glucose-Capillary: 84 mg/dL (ref 70–99)

## 2014-06-14 MED ORDER — ACETAMINOPHEN 325 MG PO TABS: 325.0000 mg | ORAL_TABLET | Freq: Four times a day (QID) | ORAL | Status: DC | PRN

## 2014-06-14 MED ORDER — OXYCODONE HCL 5 MG/5ML PO SOLN: 10.0000 mg | ORAL | Status: DC | PRN

## 2014-06-14 MED ORDER — FUROSEMIDE 20 MG PO TABS: 20.0000 mg | ORAL_TABLET | Freq: Every day | ORAL | Status: DC

## 2014-06-14 MED ORDER — SULFAMETHOXAZOLE-TRIMETHOPRIM 800-160 MG PO TABS: 1.0000 | ORAL_TABLET | Freq: Two times a day (BID) | ORAL | Status: DC
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
## 2014-06-14 NOTE — Discharge Summary (Signed)
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 1.3*  PROT 5.2* 6.1  ALBUMIN 2.3* 2.5*   No results for input(s): LIPASE, AMYLASE in the last 168 hours. No results for input(s): AMMONIA in the last 168 hours. CBC:  Recent Labs Lab 06/08/14 0350 06/09/14 0513 06/10/14 0505 06/12/14 0500  WBC 13.2* 11.5* 10.2 8.7  HGB 16.2 13.9 13.1 13.4  HCT 48.4 42.4 38.5* 39.9  MCV 94.9 95.7 94.4 93.0  PLT 218 211 208 227   Cardiac Enzymes: No results for input(s): CKTOTAL, CKMB, CKMBINDEX, TROPONINI in the last 168 hours. BNP: BNP (last 3 results) No results for input(s): BNP in the last 8760 hours.  ProBNP (last 3 results) No results for input(s): PROBNP in the last 8760 hours.  CBG:  Recent Labs Lab 06/12/14 1212 06/13/14 0739 06/13/14 1202 06/13/14 1657 06/14/14 0748  GLUCAP 87  86 110* 113* 84    Active Problems:   Small cell lung cancer   Secondary malignant neoplasm of brain and spinal cord   Cervical spine fracture   Pre-diabetes   Perforated viscus   Perforated sigmoid colon   Status post partial colectomy   Time coordinating discharge: <30 mins  Signed:  Emina Riebock, ANP-BC

## 2014-06-14 NOTE — Progress Notes (Signed)
[facility] Therapy Treatment Record Phone [phone]   Radiation Therapy was administered to [name] on: 06/14/2014  9:55 AM and was treatment # 14out of a planned course of 14 treatments.

## 2014-06-15 ENCOUNTER — Ambulatory Visit: Payer: Medicaid Other

## 2014-06-18 ENCOUNTER — Encounter (HOSPITAL_COMMUNITY): Payer: Self-pay | Admitting: Emergency Medicine

## 2014-06-18 ENCOUNTER — Emergency Department (HOSPITAL_COMMUNITY): Payer: Medicaid Other

## 2014-06-18 ENCOUNTER — Emergency Department (HOSPITAL_COMMUNITY): Payer: Medicaid Other | Admitting: Registered Nurse

## 2014-06-18 ENCOUNTER — Inpatient Hospital Stay (HOSPITAL_COMMUNITY)
Admission: EM | Admit: 2014-06-18 | Discharge: 2014-06-23 | DRG: 907 | Disposition: A | Discharge status: Home Health | Payer: Medicaid Other | Attending: Surgery | Admitting: Surgery

## 2014-06-18 ENCOUNTER — Encounter (HOSPITAL_COMMUNITY): Admission: EM | Disposition: A | Discharge status: Home Health | Payer: Medicaid Other | Source: Home / Self Care

## 2014-06-18 ENCOUNTER — Telehealth: Payer: Self-pay | Admitting: *Deleted

## 2014-06-18 DIAGNOSIS — Z933 Colostomy status: Secondary | ICD-10-CM | POA: Diagnosis not present

## 2014-06-18 DIAGNOSIS — I10 Essential (primary) hypertension: Secondary | ICD-10-CM | POA: Diagnosis present

## 2014-06-18 DIAGNOSIS — K650 Generalized (acute) peritonitis: Secondary | ICD-10-CM | POA: Diagnosis present

## 2014-06-18 DIAGNOSIS — R569 Unspecified convulsions: Secondary | ICD-10-CM | POA: Diagnosis present

## 2014-06-18 DIAGNOSIS — T8132XA Disruption of internal operation (surgical) wound, not elsewhere classified, initial encounter: Secondary | ICD-10-CM | POA: Diagnosis present

## 2014-06-18 DIAGNOSIS — C3490 Malignant neoplasm of unspecified part of unspecified bronchus or lung: Secondary | ICD-10-CM | POA: Diagnosis present

## 2014-06-18 DIAGNOSIS — N508 Other specified disorders of male genital organs: Secondary | ICD-10-CM | POA: Diagnosis present

## 2014-06-18 DIAGNOSIS — R600 Localized edema: Secondary | ICD-10-CM | POA: Diagnosis present

## 2014-06-18 DIAGNOSIS — Y838 Other surgical procedures as the cause of abnormal reaction of the patient, or of later complication, without mention of misadventure at the time of the procedure: Secondary | ICD-10-CM | POA: Diagnosis present

## 2014-06-18 DIAGNOSIS — Z87891 Personal history of nicotine dependence: Secondary | ICD-10-CM | POA: Diagnosis not present

## 2014-06-18 DIAGNOSIS — C7931 Secondary malignant neoplasm of brain: Secondary | ICD-10-CM | POA: Diagnosis present

## 2014-06-18 DIAGNOSIS — R109 Unspecified abdominal pain: Secondary | ICD-10-CM

## 2014-06-18 DIAGNOSIS — T8131XA Disruption of external operation (surgical) wound, not elsewhere classified, initial encounter: Secondary | ICD-10-CM | POA: Diagnosis present

## 2014-06-18 DIAGNOSIS — M629 Disorder of muscle, unspecified: Secondary | ICD-10-CM | POA: Diagnosis present

## 2014-06-18 HISTORY — PX: LAPAROTOMY: SHX154

## 2014-06-18 HISTORY — PX: FASCIOTOMY CLOSURE: SHX5829

## 2014-06-18 LAB — COMPREHENSIVE METABOLIC PANEL
ALK PHOS: 144 U/L — AB (ref 38–126)
ALT: 65 U/L — AB (ref 17–63)
ANION GAP: 11 (ref 5–15)
AST: 34 U/L (ref 15–41)
Albumin: 3.4 g/dL — ABNORMAL LOW (ref 3.5–5.0)
BILIRUBIN TOTAL: 0.5 mg/dL (ref 0.3–1.2)
BUN: 11 mg/dL (ref 6–20)
CO2: 26 mmol/L (ref 22–32)
Calcium: 8.8 mg/dL — ABNORMAL LOW (ref 8.9–10.3)
Chloride: 97 mmol/L — ABNORMAL LOW (ref 101–111)
Creatinine, Ser: 0.75 mg/dL (ref 0.61–1.24)
GLUCOSE: 127 mg/dL — AB (ref 70–99)
POTASSIUM: 3.5 mmol/L (ref 3.5–5.1)
SODIUM: 134 mmol/L — AB (ref 135–145)
Total Protein: 6.6 g/dL (ref 6.5–8.1)

## 2014-06-18 LAB — TYPE AND SCREEN
ABO/RH(D): O NEG
Antibody Screen: NEGATIVE

## 2014-06-18 LAB — CBC WITH DIFFERENTIAL/PLATELET
BASOS ABS: 0 10*3/uL (ref 0.0–0.1)
Basophils Relative: 0 % (ref 0–1)
EOS ABS: 0 10*3/uL (ref 0.0–0.7)
EOS PCT: 0 % (ref 0–5)
HCT: 41.3 % (ref 39.0–52.0)
Hemoglobin: 14 g/dL (ref 13.0–17.0)
Lymphocytes Relative: 6 % — ABNORMAL LOW (ref 12–46)
Lymphs Abs: 1 10*3/uL (ref 0.7–4.0)
MCH: 31.2 pg (ref 26.0–34.0)
MCHC: 33.9 g/dL (ref 30.0–36.0)
MCV: 92 fL (ref 78.0–100.0)
Monocytes Absolute: 0.9 10*3/uL (ref 0.1–1.0)
Monocytes Relative: 6 % (ref 3–12)
Neutro Abs: 14.5 10*3/uL — ABNORMAL HIGH (ref 1.7–7.7)
Neutrophils Relative %: 88 % — ABNORMAL HIGH (ref 43–77)
PLATELETS: 282 10*3/uL (ref 150–400)
RBC: 4.49 MIL/uL (ref 4.22–5.81)
RDW: 12.6 % (ref 11.5–15.5)
WBC: 16.4 10*3/uL — ABNORMAL HIGH (ref 4.0–10.5)

## 2014-06-18 LAB — ABO/RH: ABO/RH(D): O NEG

## 2014-06-18 SURGERY — LAPAROTOMY, EXPLORATORY
Anesthesia: General | Site: Abdomen

## 2014-06-18 MED ORDER — SODIUM CHLORIDE 0.9 % IV SOLN
3.0000 g | Freq: Four times a day (QID) | INTRAVENOUS | Status: DC
  Administered 2014-06-18 – 2014-06-21 (×10): 3 g via INTRAVENOUS
  Filled 2014-06-18 (×14): qty 3

## 2014-06-18 MED ORDER — LACTATED RINGERS IV SOLN
INTRAVENOUS | Status: DC | PRN
  Administered 2014-06-18 (×2): via INTRAVENOUS

## 2014-06-18 MED ORDER — FENTANYL CITRATE (PF) 100 MCG/2ML IJ SOLN
INTRAMUSCULAR | Status: AC
  Filled 2014-06-18: qty 2

## 2014-06-18 MED ORDER — PROPOFOL 10 MG/ML IV BOLUS
INTRAVENOUS | Status: DC | PRN
  Administered 2014-06-18: 200 mg via INTRAVENOUS

## 2014-06-18 MED ORDER — DEXAMETHASONE SODIUM PHOSPHATE 10 MG/ML IJ SOLN
INTRAMUSCULAR | Status: DC | PRN
  Administered 2014-06-18: 10 mg via INTRAVENOUS

## 2014-06-18 MED ORDER — ACETAMINOPHEN 650 MG RE SUPP: 650.0000 mg | Freq: Four times a day (QID) | RECTAL | Status: DC | PRN

## 2014-06-18 MED ORDER — FENTANYL CITRATE (PF) 100 MCG/2ML IJ SOLN
INTRAMUSCULAR | Status: DC | PRN
  Administered 2014-06-18: 50 ug via INTRAVENOUS
  Administered 2014-06-18: 100 ug via INTRAVENOUS
  Administered 2014-06-18: 50 ug via INTRAVENOUS
  Administered 2014-06-18 (×2): 100 ug via INTRAVENOUS
  Administered 2014-06-18 (×2): 50 ug via INTRAVENOUS

## 2014-06-18 MED ORDER — ONDANSETRON HCL 4 MG/2ML IJ SOLN
INTRAMUSCULAR | Status: DC | PRN
  Administered 2014-06-18: 4 mg via INTRAVENOUS

## 2014-06-18 MED ORDER — LABETALOL HCL 5 MG/ML IV SOLN
INTRAVENOUS | Status: DC | PRN
  Administered 2014-06-18: 2.5 mg via INTRAVENOUS
  Administered 2014-06-18 (×2): 5 mg via INTRAVENOUS

## 2014-06-18 MED ORDER — PIPERACILLIN-TAZOBACTAM 3.375 G IVPB 30 MIN
3.3750 g | Freq: Once | INTRAVENOUS | Status: AC
  Administered 2014-06-18: 3.375 g via INTRAVENOUS
  Filled 2014-06-18: qty 50

## 2014-06-18 MED ORDER — FENTANYL CITRATE (PF) 100 MCG/2ML IJ SOLN
25.0000 ug | INTRAMUSCULAR | Status: DC | PRN
  Administered 2014-06-18 (×3): 50 ug via INTRAVENOUS

## 2014-06-18 MED ORDER — SODIUM CHLORIDE 0.9 % IJ SOLN: 9.0000 mL | INTRAMUSCULAR | Status: DC | PRN

## 2014-06-18 MED ORDER — HYDROMORPHONE 0.3 MG/ML IV SOLN
INTRAVENOUS | Status: DC
  Administered 2014-06-18: 21:00:00 via INTRAVENOUS
  Administered 2014-06-18: 1.2 mg via INTRAVENOUS
  Administered 2014-06-19: 09:00:00 via INTRAVENOUS
  Administered 2014-06-19: 1.5 mg via INTRAVENOUS
  Filled 2014-06-18: qty 25

## 2014-06-18 MED ORDER — MIDAZOLAM HCL 2 MG/2ML IJ SOLN
INTRAMUSCULAR | Status: AC
  Filled 2014-06-18: qty 2

## 2014-06-18 MED ORDER — PROPOFOL 10 MG/ML IV BOLUS
INTRAVENOUS | Status: AC
  Filled 2014-06-18: qty 20

## 2014-06-18 MED ORDER — FENTANYL CITRATE (PF) 250 MCG/5ML IJ SOLN
INTRAMUSCULAR | Status: AC
  Filled 2014-06-18: qty 5

## 2014-06-18 MED ORDER — HYDROMORPHONE HCL 1 MG/ML IJ SOLN
1.0000 mg | Freq: Once | INTRAMUSCULAR | Status: AC
Start: 2014-06-18 — End: 2014-06-18
  Administered 2014-06-18: 1 mg via INTRAVENOUS
  Filled 2014-06-18: qty 1

## 2014-06-18 MED ORDER — ENOXAPARIN SODIUM 40 MG/0.4ML ~~LOC~~ SOLN
40.0000 mg | SUBCUTANEOUS | Status: DC
  Administered 2014-06-19 – 2014-06-22 (×4): 40 mg via SUBCUTANEOUS
  Filled 2014-06-18 (×6): qty 0.4

## 2014-06-18 MED ORDER — PANTOPRAZOLE SODIUM 40 MG IV SOLR
40.0000 mg | Freq: Every day | INTRAVENOUS | Status: DC
  Administered 2014-06-18 – 2014-06-21 (×4): 40 mg via INTRAVENOUS
  Filled 2014-06-18 (×6): qty 40

## 2014-06-18 MED ORDER — DIPHENHYDRAMINE HCL 12.5 MG/5ML PO ELIX: 12.5000 mg | ORAL_SOLUTION | Freq: Four times a day (QID) | ORAL | Status: DC | PRN

## 2014-06-18 MED ORDER — LABETALOL HCL 5 MG/ML IV SOLN
INTRAVENOUS | Status: AC
  Filled 2014-06-18: qty 4

## 2014-06-18 MED ORDER — ONDANSETRON HCL 4 MG/2ML IJ SOLN
4.0000 mg | Freq: Once | INTRAMUSCULAR | Status: AC
  Administered 2014-06-18: 4 mg via INTRAVENOUS
  Filled 2014-06-18: qty 2

## 2014-06-18 MED ORDER — ACETAMINOPHEN 325 MG PO TABS: 650.0000 mg | ORAL_TABLET | Freq: Four times a day (QID) | ORAL | Status: DC | PRN

## 2014-06-18 MED ORDER — LACTATED RINGERS IV SOLN: INTRAVENOUS | Status: DC

## 2014-06-18 MED ORDER — ROCURONIUM BROMIDE 100 MG/10ML IV SOLN
INTRAVENOUS | Status: AC
  Filled 2014-06-18: qty 1

## 2014-06-18 MED ORDER — MIDAZOLAM HCL 5 MG/5ML IJ SOLN
INTRAMUSCULAR | Status: DC | PRN
  Administered 2014-06-18: 1 mg via INTRAVENOUS

## 2014-06-18 MED ORDER — PROMETHAZINE HCL 25 MG/ML IJ SOLN: 6.2500 mg | INTRAMUSCULAR | Status: DC | PRN

## 2014-06-18 MED ORDER — ONDANSETRON HCL 4 MG/2ML IJ SOLN: 4.0000 mg | Freq: Four times a day (QID) | INTRAMUSCULAR | Status: DC | PRN

## 2014-06-18 MED ORDER — DEXAMETHASONE SODIUM PHOSPHATE 10 MG/ML IJ SOLN
INTRAMUSCULAR | Status: AC
  Filled 2014-06-18: qty 1

## 2014-06-18 MED ORDER — LIDOCAINE HCL (CARDIAC) 20 MG/ML IV SOLN
INTRAVENOUS | Status: AC
  Filled 2014-06-18: qty 10

## 2014-06-18 MED ORDER — KCL IN DEXTROSE-NACL 20-5-0.45 MEQ/L-%-% IV SOLN
INTRAVENOUS | Status: DC
  Administered 2014-06-18 – 2014-06-20 (×4): via INTRAVENOUS
  Administered 2014-06-21: 1000 mL via INTRAVENOUS
  Administered 2014-06-21 – 2014-06-22 (×2): via INTRAVENOUS
  Filled 2014-06-18 (×9): qty 1000

## 2014-06-18 MED ORDER — HYDROMORPHONE 0.3 MG/ML IV SOLN
INTRAVENOUS | Status: AC
  Filled 2014-06-18: qty 25

## 2014-06-18 MED ORDER — DIPHENHYDRAMINE HCL 50 MG/ML IJ SOLN: 12.5000 mg | Freq: Four times a day (QID) | INTRAMUSCULAR | Status: DC | PRN

## 2014-06-18 MED ORDER — SUGAMMADEX SODIUM 500 MG/5ML IV SOLN
INTRAVENOUS | Status: DC | PRN
  Administered 2014-06-18: 200 mg via INTRAVENOUS

## 2014-06-18 MED ORDER — LABETALOL HCL 5 MG/ML IV SOLN
10.0000 mg | INTRAVENOUS | Status: DC | PRN
  Administered 2014-06-18: 10 mg via INTRAVENOUS

## 2014-06-18 MED ORDER — ONDANSETRON HCL 4 MG/2ML IJ SOLN
INTRAMUSCULAR | Status: AC
  Filled 2014-06-18: qty 2

## 2014-06-18 MED ORDER — SODIUM CHLORIDE 0.9 % IV BOLUS (SEPSIS)
1000.0000 mL | Freq: Once | INTRAVENOUS | Status: AC
  Administered 2014-06-18: 1000 mL via INTRAVENOUS

## 2014-06-18 MED ORDER — SUCCINYLCHOLINE CHLORIDE 20 MG/ML IJ SOLN
INTRAMUSCULAR | Status: DC | PRN
  Administered 2014-06-18: 100 mg via INTRAVENOUS

## 2014-06-18 MED ORDER — LABETALOL HCL 5 MG/ML IV SOLN
5.0000 mg | Freq: Once | INTRAVENOUS | Status: AC
  Administered 2014-06-18: 5 mg via INTRAVENOUS

## 2014-06-18 MED ORDER — NALOXONE HCL 0.4 MG/ML IJ SOLN: 0.4000 mg | INTRAMUSCULAR | Status: DC | PRN

## 2014-06-18 MED ORDER — ROCURONIUM BROMIDE 100 MG/10ML IV SOLN
INTRAVENOUS | Status: DC | PRN
  Administered 2014-06-18: 25 mg via INTRAVENOUS
  Administered 2014-06-18: 20 mg via INTRAVENOUS

## 2014-06-18 MED ORDER — LIDOCAINE HCL (CARDIAC) 20 MG/ML IV SOLN
INTRAVENOUS | Status: DC | PRN
  Administered 2014-06-18: 75 mg via INTRAVENOUS
  Administered 2014-06-18: 25 mg via INTRATRACHEAL

## 2014-06-18 MED ORDER — SODIUM CHLORIDE 0.9 % IR SOLN
Status: DC | PRN
  Administered 2014-06-18: 2000 mL

## 2014-06-18 MED ORDER — MEPERIDINE HCL 50 MG/ML IJ SOLN: 6.2500 mg | INTRAMUSCULAR | Status: DC | PRN

## 2014-06-18 MED FILL — Sugammadex Sodium IV 200 MG/2ML (Base Equivalent): INTRAVENOUS | Qty: 2 | Status: AC

## 2014-06-18 SURGICAL SUPPLY — 38 items
APPLICATOR COTTON TIP 6IN STRL (MISCELLANEOUS) ×4 IMPLANT
BLADE EXTENDED COATED 6.5IN (ELECTRODE) IMPLANT
BLADE HEX COATED 2.75 (ELECTRODE) ×4 IMPLANT
COVER MAYO STAND STRL (DRAPES) IMPLANT
DRAPE LAPAROSCOPIC ABDOMINAL (DRAPES) ×4 IMPLANT
DRAPE WARM FLUID 44X44 (DRAPE) IMPLANT
ELECT REM PT RETURN 9FT ADLT (ELECTROSURGICAL) ×4
ELECTRODE REM PT RTRN 9FT ADLT (ELECTROSURGICAL) ×2 IMPLANT
GAUZE SPONGE 4X4 12PLY STRL (GAUZE/BANDAGES/DRESSINGS) ×8 IMPLANT
GLOVE BIOGEL PI IND STRL 7.0 (GLOVE) ×2 IMPLANT
GLOVE BIOGEL PI INDICATOR 7.0 (GLOVE) ×2
GLOVE SURG ORTHO 8.0 STRL STRW (GLOVE) ×4 IMPLANT
GOWN STRL REUS W/TWL LRG LVL3 (GOWN DISPOSABLE) ×4 IMPLANT
GOWN STRL REUS W/TWL XL LVL3 (GOWN DISPOSABLE) ×8 IMPLANT
KIT BASIN OR (CUSTOM PROCEDURE TRAY) ×4 IMPLANT
NS IRRIG 1000ML POUR BTL (IV SOLUTION) ×4 IMPLANT
PACK GENERAL/GYN (CUSTOM PROCEDURE TRAY) ×4 IMPLANT
PAD ABD 8X10 STRL (GAUZE/BANDAGES/DRESSINGS) ×8 IMPLANT
SPONGE LAP 18X18 X RAY DECT (DISPOSABLE) IMPLANT
STAPLER VISISTAT 35W (STAPLE) ×4 IMPLANT
SUCTION POOLE TIP (SUCTIONS) IMPLANT
SUT ETHIBOND 5 LR DA (SUTURE) ×12 IMPLANT
SUT NOV 1 T60/GS (SUTURE) IMPLANT
SUT NOVA 1 T20/GS 25DT (SUTURE) ×8 IMPLANT
SUT RET BRIDGE (SUTURE) ×4 IMPLANT
SUT SILK 2 0 (SUTURE) ×2
SUT SILK 2 0 SH CR/8 (SUTURE) IMPLANT
SUT SILK 2-0 18XBRD TIE 12 (SUTURE) ×2 IMPLANT
SUT SILK 3 0 (SUTURE)
SUT SILK 3 0 SH CR/8 (SUTURE) ×4 IMPLANT
SUT SILK 3-0 18XBRD TIE 12 (SUTURE) IMPLANT
SUT VICRYL 2 0 18  UND BR (SUTURE)
SUT VICRYL 2 0 18 UND BR (SUTURE) IMPLANT
TAPE CLOTH SURG 4X10 WHT LF (GAUZE/BANDAGES/DRESSINGS) ×4 IMPLANT
TOWEL OR 17X26 10 PK STRL BLUE (TOWEL DISPOSABLE) ×8 IMPLANT
TRAY FOLEY W/METER SILVER 14FR (SET/KITS/TRAYS/PACK) IMPLANT
WATER STERILE IRR 1500ML POUR (IV SOLUTION) IMPLANT
YANKAUER SUCT BULB TIP NO VENT (SUCTIONS) IMPLANT

## 2014-06-18 NOTE — ED Provider Notes (Signed)
CSN: [id number]     Arrival date & time 06/18/14  1658 History   First MD Initiated Contact with Patient 06/18/14 1701     Chief Complaint  Patient presents with  . Wound Dehiscence  . Nausea     (Consider location/radiation/quality/duration/timing/severity/associated sxs/prior Treatment) The history is provided by the patient.  Orlen Leedy is a 44 y.o. 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 Patient's wife changed his dressing this morning and everything seemed fine. He then had some cough and nausea and the wife checked on his dressing and noticed more tissues exposed. She wasn't sure what it is. He still has some stool and is still passing gas through the colostomy. Denies any fevers or chills.    Past Medical History  Diagnosis Date  . Hypertension   . Seizures   . 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   History reviewed. No pertinent past surgical history. History reviewed. No pertinent family history. History  Substance Use Topics  . Smoking status: Former Research scientist (life sciences)  . Smokeless tobacco: Not on file  . Alcohol Use: 3.0 oz/week    5 Cans of beer per week     Comment: per week    Review of Systems  Gastrointestinal: Positive for nausea, abdominal pain and abdominal distention.  All other systems reviewed and are negative.     Allergies  Review of patient's allergies indicates no known allergies.  Home Medications   Prior to Admission medications   Not on File   BP 197/95 mmHg  Temp(Src) 97.7 F (36.5 C) (Oral)  Resp 18 Physical Exam  Constitutional: He is oriented to person, place, and time.  Chronically ill   HENT:  Head: Normocephalic.  MM dry   Eyes: Conjunctivae are normal. Pupils are equal, round, and reactive to light.  Neck:  C collar in place    Cardiovascular: Normal rate, regular rhythm and normal heart sounds.   Pulmonary/Chest: Effort normal and breath sounds normal. No respiratory distress. He has no wheezes. He has no rales.  Abdominal:  Distended, colostomy with minimal stool. Large abdominal open wound with fat and intestines exposed.   Musculoskeletal: Normal range of motion.  Neurological: He is alert and oriented to person, place, and time.  Skin: Skin is warm and dry.  Psychiatric: He has a normal mood and affect. His behavior is normal. Judgment and thought content normal.  Nursing note and vitals reviewed.   ED Course  Procedures (including critical care time) Labs Review Labs Reviewed  CBC WITH DIFFERENTIAL/PLATELET - Abnormal; Notable for the following:    WBC 16.4 (*)    Neutrophils Relative % 88 (*)    Neutro Abs 14.5 (*)    Lymphocytes Relative 6 (*)    All other components within normal limits  COMPREHENSIVE METABOLIC PANEL  PROTIME-INR  TYPE AND SCREEN    Imaging Review Dg Abd Acute W/chest  06/18/2014   CLINICAL DATA:  44 year old male with abdominal pain and dehiscence of surgical abdominal wound.  EXAM: DG ABDOMEN ACUTE W/ 1V CHEST  COMPARISON:  None.  FINDINGS: Prominent thickening of the right paratracheal stripe with right-to-left of bowing of the airway suggesting right paratracheal adenopathy. Heart size is within normal limits. No discrete pulmonary mass or nodule. No acute osseous abnormality. Incompletely imaged cervical spine collar.  No evidence of free  air on the up right radiograph. The bowel gas pattern is not obstructed. Left lower quadrant ostomy. No organomegaly or abnormal calcification.  IMPRESSION: 1. No acute cardiopulmonary process. 2. Suspect right paratracheal adenopathy or mass. Given known history of malignancy, correlation with outside imaging is recommended. 3. Normal bowel gas pattern.  No evidence of obstruction.   Electronically Signed   By: Jacqulynn Cadet M.D.   On:  06/18/2014 17:53     EKG Interpretation None      MDM   Final diagnoses:  Abdominal pain    Kota Ciancio is a 44 y.o. male here with ab pain, wound dehiscence. I think the wound came apart and now intestines are exposed. Will get preop labs, consult surgery.   6:16 PM Wbc 16. Given zosyn empirically. Preop labs drawn. Consulted Dr. Harlow Asa from surgery, who plans on operating to close the wound.    Wandra Arthurs, MD 06/18/14 [phone]

## 2014-06-18 NOTE — Anesthesia Preprocedure Evaluation (Signed)
Anesthesia Evaluation  Patient identified by MRN, date of birth, ID band Patient awake    Reviewed: Allergy & Precautions, H&P , NPO status , Patient's Chart, lab work & pertinent test results  Airway Mallampati: II  TM Distance: >3 FB Neck ROM: full    Dental no notable dental hx.    Pulmonary former smoker,  Lung cancer breath sounds clear to auscultation  Pulmonary exam normal       Cardiovascular Exercise Tolerance: Good hypertension, Normal cardiovascular examRhythm:regular Rate:Normal     Neuro/Psych Seizures -,  Brain mets treated with radiation recently. Broken neck healing with cervical collar on. negative psych ROS   GI/Hepatic negative GI ROS, Neg liver ROS,   Endo/Other  negative endocrine ROS  Renal/GU negative Renal ROS  negative genitourinary   Musculoskeletal   Abdominal   Peds  Hematology negative hematology ROS (+)   Anesthesia Other Findings   Reproductive/Obstetrics negative OB ROS                             Anesthesia Physical  Anesthesia Plan  ASA: III and emergent  Anesthesia Plan: General   Post-op Pain Management:    Induction: Intravenous  Airway Management Planned: Oral ETT and Video Laryngoscope Planned  Additional Equipment:   Intra-op Plan:   Post-operative Plan: Possible Post-op intubation/ventilation  Informed Consent: I have reviewed the patients History and Physical, chart, labs and discussed the procedure including the risks, benefits and alternatives for the proposed anesthesia with the patient or authorized representative who has indicated his/her understanding and acceptance.   Dental Advisory Given  Plan Discussed with: CRNA and Surgeon  Anesthesia Plan Comments:         Anesthesia Quick Evaluation

## 2014-06-18 NOTE — ED Notes (Signed)
patient came in wearing a vista collar.

## 2014-06-18 NOTE — Telephone Encounter (Signed)
[place] nurse called to say patient needs refill on decadron and oxycodone '15mg'$ . Has 3 pills left. Is almost out of Roxicodone as well. Has appt on Friday, but will run out before OV.  Attempted to call patient to see which pain med he prefers, no answer.

## 2014-06-18 NOTE — Anesthesia Procedure Notes (Signed)
Procedure Name: Intubation Date/Time: 06/18/2014 8:09 PM Performed by: Lissa Morales Pre-anesthesia Checklist: Patient identified, Emergency Drugs available, Suction available and Patient being monitored Patient Re-evaluated:Patient Re-evaluated prior to inductionOxygen Delivery Method: Circle System Utilized Preoxygenation: Pre-oxygenation with 100% oxygen Intubation Type: IV induction and Rapid sequence Ventilation: Mask ventilation without difficulty Laryngoscope Size: Mac and 3 Grade View: Grade III Tube type: Oral Tube size: 7.5 mm Number of attempts: 1 Airway Equipment and Method: Stylet,  Oral airway and Video-laryngoscopy Placement Confirmation: ETT inserted through vocal cords under direct vision,  positive ETCO2 and breath sounds checked- equal and bilateral Secured at: 21 cm Tube secured with: Tape Dental Injury: Teeth and Oropharynx as per pre-operative assessment  Difficulty Due To: Difficulty was anticipated, Difficult Airway- due to limited oral opening, Difficult Airway- due to reduced neck mobility, Difficult Airway- due to cervical collar and Difficult Airway-  due to neck instability Comments: Planned Glidescope  2ndary to cervical collar and fractured neck 4 weeks ago. See comments

## 2014-06-18 NOTE — ED Notes (Addendum)
Pt with Hx of cancer c/o surgical wound dehiscence and nausea. Pt had parts of bowels removed, has open abdominal surgical wound across abdomen with adipose tissue and intestines exposed. Patient also has cervical neck fracture with cervical collar in place. Last radiation April 29th. MD at bedside.

## 2014-06-18 NOTE — Brief Op Note (Signed)
06/18/2014  9:06 PM  PATIENT:  [name]  44 y.o. male  PRE-OPERATIVE DIAGNOSIS:  ABDOMINAL WOUND DEHISCENCE  POST-OPERATIVE DIAGNOSIS:  ABDOMINAL WOUND DEHISCENCE  PROCEDURE:  Exploratory laparotomy with closure of fascial dehiscence  SURGEON:  Surgeon(s) and Role:    * Armandina Gemma, MD - Primary  ANESTHESIA:   general  EBL:     BLOOD ADMINISTERED:none  DRAINS: none   LOCAL MEDICATIONS USED:  NONE  SPECIMEN:  No Specimen  DISPOSITION OF SPECIMEN:  N/A  COUNTS:  YES  TOURNIQUET:  * No tourniquets in log *  DICTATION: .Other Dictation: Dictation Number [id number]  PLAN OF CARE: Admit to inpatient   PATIENT DISPOSITION:  PACU - hemodynamically stable.   Delay start of Pharmacological VTE agent (>24hrs) due to surgical blood loss or risk of bleeding: yes  [name], MD, [facility] Office: [phone]

## 2014-06-18 NOTE — Transfer of Care (Signed)
Immediate Anesthesia Transfer of Care Note  Patient: [name]  Procedure(s) Performed: Procedure(s): EXPLORATORY LAPAROTOMY/ABDOMINAL WOUND DIHISCENCE (N/A) FASCIOTOMY CLOSURE (N/A)  Patient Location: PACU  Anesthesia Type:General  Level of Consciousness: awake, alert , oriented and patient cooperative  Airway & Oxygen Therapy: Patient Spontanous Breathing and Patient connected to face mask oxygen  Post-op Assessment: Report given to RN, Post -op Vital signs reviewed and stable and Patient moving all extremities X 4  Post vital signs: stable  Last Vitals:  Filed Vitals:   06/18/14 2124  BP: 172/102  Pulse:   Temp:   Resp:     Complications: No apparent anesthesia complications

## 2014-06-18 NOTE — H&P (Signed)
[name] is an 44 y.o. male.    General Surgery [facility]  Chief Complaint: fascial dehiscence with evisceration  HPI: patient is a 44 yo WM status post ex lap and descending colostomy on 4/22 for perforated diverticular disease with abscess by Dr. Alphonsa Overall.  Discharged on 4/29 with open dressing changes and colostomy care.  Today with dehiscence of midline wound and protruding bowel.  Brought to ER for management.  Past Medical History  Diagnosis Date  . Hypertension   . Seizures   . 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    Past Surgical History  Procedure Laterality Date  . Colostomy  06/07/14    History reviewed. No pertinent family history. Social History:  reports that he quit smoking 5 days ago. He has never used smokeless tobacco. He reports that he drinks about 3.0 oz of alcohol per week. He reports that he does not use illicit drugs.  Allergies: No Known Allergies   (Not in a hospital admission)  Results for orders placed or performed during the hospital encounter of 06/18/14 (from the past 48 hour(s))  CBC with Differential     Status: Abnormal   Collection Time: 06/18/14  5:37 PM  Result Value Ref Range   WBC 16.4 (H) 4.0 - 10.5 K/uL   RBC 4.49 4.22 - 5.81 MIL/uL   Hemoglobin 14.0 13.0 - 17.0 g/dL   HCT 41.3 39.0 - 52.0 %   MCV 92.0 78.0 - 100.0 fL   MCH 31.2 26.0 - 34.0 pg   MCHC 33.9 30.0 - 36.0 g/dL   RDW 12.6 11.5 - 15.5 %   Platelets 282 150 - 400 K/uL   Neutrophils Relative % 88 (H) 43 - 77 %   Neutro Abs 14.5 (H) 1.7 - 7.7 K/uL   Lymphocytes Relative 6 (L) 12 - 46 %   Lymphs Abs 1.0 0.7 - 4.0 K/uL   Monocytes Relative 6 3 - 12 %   Monocytes Absolute 0.9 0.1 - 1.0 K/uL   Eosinophils Relative 0 0 - 5 %   Eosinophils Absolute 0.0 0.0 - 0.7 K/uL   Basophils Relative 0 0 - 1 %   Basophils Absolute 0.0 0.0 - 0.1 K/uL  Comprehensive metabolic panel     Status: Abnormal   Collection Time: 06/18/14  5:37 PM   Result Value Ref Range   Sodium 134 (L) 135 - 145 mmol/L   Potassium 3.5 3.5 - 5.1 mmol/L   Chloride 97 (L) 101 - 111 mmol/L   CO2 26 22 - 32 mmol/L   Glucose, Bld 127 (H) 70 - 99 mg/dL   BUN 11 6 - 20 mg/dL   Creatinine, Ser 0.75 0.61 - 1.24 mg/dL   Calcium 8.8 (L) 8.9 - 10.3 mg/dL   Total Protein 6.6 6.5 - 8.1 g/dL   Albumin 3.4 (L) 3.5 - 5.0 g/dL   AST 34 15 - 41 U/L   ALT 65 (H) 17 - 63 U/L   Alkaline Phosphatase 144 (H) 38 - 126 U/L   Total Bilirubin 0.5 0.3 - 1.2 mg/dL   GFR calc non Af Amer >60 >60 mL/min   GFR calc Af Amer >60 >60 mL/min    Comment: (NOTE) The eGFR has been calculated using the CKD EPI equation. This calculation has not been validated in all clinical situations. eGFR's persistently <90 mL/min signify possible Chronic Kidney Disease.    Anion gap 11 5 - 15  Dg Abd Acute W/chest  06/18/2014   CLINICAL DATA:  44 year old male with abdominal pain and dehiscence of surgical abdominal wound.  EXAM: DG ABDOMEN ACUTE W/ 1V CHEST  COMPARISON:  None.  FINDINGS: Prominent thickening of the right paratracheal stripe with right-to-left of bowing of the airway suggesting right paratracheal adenopathy. Heart size is within normal limits. No discrete pulmonary mass or nodule. No acute osseous abnormality. Incompletely imaged cervical spine collar.  No evidence of free air on the up right radiograph. The bowel gas pattern is not obstructed. Left lower quadrant ostomy. No organomegaly or abnormal calcification.  IMPRESSION: 1. No acute cardiopulmonary process. 2. Suspect right paratracheal adenopathy or mass. Given known history of malignancy, correlation with outside imaging is recommended. 3. Normal bowel gas pattern.  No evidence of obstruction.   Electronically Signed   By: Jacqulynn Cadet M.D.   On: 06/18/2014 17:53    Review of Systems  Constitutional: Positive for malaise/fatigue.  HENT: Negative.   Eyes: Negative.   Respiratory: Negative.   Cardiovascular:  Negative.   Gastrointestinal: Positive for abdominal pain.  Genitourinary: Negative.   Musculoskeletal: Negative.   Skin: Negative.   Neurological: Positive for weakness.  Endo/Heme/Allergies: Negative.   Psychiatric/Behavioral: Negative.     Blood pressure 197/95, temperature 97.7 F (36.5 C), temperature source Oral, resp. rate 18. Physical Exam  Constitutional: He appears distressed.  HENT:  Head: Normocephalic and atraumatic.  Right Ear: External ear normal.  Left Ear: External ear normal.  Eyes: No scleral icterus.  Neck:  In hard cervical collar  Cardiovascular: Normal rate and regular rhythm.   Respiratory: Effort normal and breath sounds normal. No respiratory distress. He has no wheezes.  GI:  Protuberant with ABD's for dressings - open midline wound with visible small intestine and omentum  Neurological: He is alert.  Skin: Skin is warm and dry.     Assessment/Plan Fascial dehiscence with partial evisceration  Plan urgent OR for closure or VAC dressing placement  Discussed with patient, his sister, and his girlfriend at the bedside.  OR notified and preparing for patient.  May require ICU admission and ventilation if abdominal VAC placed.  The risks and benefits of the procedure have been discussed at length with the patient.  The patient understands the proposed procedure, potential alternative treatments, and the course of recovery to be expected.  All of the patient's questions have been answered at this time.  The patient wishes to proceed with surgery.  [name], MD, [facility] Office: [place] 06/18/2014, 6:57 PM

## 2014-06-18 NOTE — Progress Notes (Signed)
Pt states he has been seen by Dr Vernon Prey and Dr [name] At [place] updated

## 2014-06-19 ENCOUNTER — Encounter (HOSPITAL_COMMUNITY): Payer: Self-pay | Admitting: Surgery

## 2014-06-19 LAB — BASIC METABOLIC PANEL
Anion gap: 11 (ref 5–15)
BUN: 8 mg/dL (ref 6–20)
CHLORIDE: 97 mmol/L — AB (ref 101–111)
CO2: 26 mmol/L (ref 22–32)
CREATININE: 0.67 mg/dL (ref 0.61–1.24)
Calcium: 8.8 mg/dL — ABNORMAL LOW (ref 8.9–10.3)
GFR calc non Af Amer: >60 mL/min (ref >60)
Glucose, Bld: 221 mg/dL — ABNORMAL HIGH (ref 70–99)
Potassium: 4.1 mmol/L (ref 3.5–5.1)
Sodium: 134 mmol/L — ABNORMAL LOW (ref 135–145)

## 2014-06-19 LAB — CBC
HCT: 44.6 % (ref 39.0–52.0)
Hemoglobin: 15.3 g/dL (ref 13.0–17.0)
MCH: 31.9 pg (ref 26.0–34.0)
MCHC: 34.3 g/dL (ref 30.0–36.0)
MCV: 92.9 fL (ref 78.0–100.0)
PLATELETS: 304 10*3/uL (ref 150–400)
RBC: 4.8 MIL/uL (ref 4.22–5.81)
RDW: 12.8 % (ref 11.5–15.5)
WBC: 17.8 10*3/uL — ABNORMAL HIGH (ref 4.0–10.5)

## 2014-06-19 LAB — MRSA PCR SCREENING: MRSA by PCR: NEGATIVE

## 2014-06-19 MED ORDER — OXYCODONE-ACETAMINOPHEN 5-325 MG PO TABS
2.0000 | ORAL_TABLET | ORAL | Status: DC | PRN
  Administered 2014-06-19 – 2014-06-20 (×4): 2 via ORAL
  Filled 2014-06-19 (×4): qty 2

## 2014-06-19 MED ORDER — LABETALOL HCL 5 MG/ML IV SOLN
2.5000 mg | INTRAVENOUS | Status: DC | PRN
  Administered 2014-06-19 (×2): 5 mg via INTRAVENOUS
  Filled 2014-06-19 (×3): qty 4

## 2014-06-19 MED ORDER — CLONIDINE HCL 0.1 MG PO TABS
0.1000 mg | ORAL_TABLET | Freq: Three times a day (TID) | ORAL | Status: DC
  Administered 2014-06-19 – 2014-06-23 (×13): 0.1 mg via ORAL
  Filled 2014-06-19 (×18): qty 1

## 2014-06-19 MED ORDER — FUROSEMIDE 20 MG PO TABS
20.0000 mg | ORAL_TABLET | Freq: Every day | ORAL | Status: DC
  Administered 2014-06-19 – 2014-06-23 (×5): 20 mg via ORAL
  Filled 2014-06-19 (×5): qty 1

## 2014-06-19 MED ORDER — HYDROMORPHONE HCL 1 MG/ML IJ SOLN
0.5000 mg | INTRAMUSCULAR | Status: DC | PRN
  Administered 2014-06-19 – 2014-06-20 (×10): 0.5 mg via INTRAVENOUS
  Filled 2014-06-19 (×7): qty 1

## 2014-06-19 MED ORDER — LEVETIRACETAM 500 MG PO TABS
500.0000 mg | ORAL_TABLET | Freq: Two times a day (BID) | ORAL | Status: DC
  Administered 2014-06-19 – 2014-06-23 (×9): 500 mg via ORAL
  Filled 2014-06-19 (×11): qty 1

## 2014-06-19 MED ORDER — DEXAMETHASONE 4 MG PO TABS
4.0000 mg | ORAL_TABLET | Freq: Four times a day (QID) | ORAL | Status: DC
  Administered 2014-06-19 – 2014-06-23 (×17): 4 mg via ORAL
  Filled 2014-06-19 (×13): qty 1
  Filled 2014-06-19: qty 2
  Filled 2014-06-19: qty 1
  Filled 2014-06-19: qty 2
  Filled 2014-06-19 (×2): qty 1
  Filled 2014-06-19: qty 2
  Filled 2014-06-19 (×2): qty 1
  Filled 2014-06-19 (×2): qty 2
  Filled 2014-06-19: qty 1

## 2014-06-19 MED ORDER — POLYETHYLENE GLYCOL 3350 17 G PO PACK
17.0000 g | PACK | Freq: Every day | ORAL | Status: DC
  Administered 2014-06-19 – 2014-06-22 (×4): 17 g via ORAL
  Filled 2014-06-19 (×5): qty 1

## 2014-06-19 NOTE — Progress Notes (Signed)
Initial Nutrition Assessment  DOCUMENTATION CODES:  INTERVENTION: - Once diet advanced, add Ensure Enlive po TID, each supplement provides 350 kcal and 20 grams of protein - Pt educated on ways to add calories and protein to diet at home.  - RD will continue to monitor  NUTRITION DIAGNOSIS:  Inadequate oral intake related to inability to eat as evidenced by NPO/Clear liquid diet  GOAL:  Patient will meet greater than or equal to 90% of their needs  MONITOR:  PO intake, Supplement acceptance, Diet advancement, Labs, Weight trends  REASON FOR ASSESSMENT:  Malnutrition Screening Tool    ASSESSMENT: 44 yo WM status post ex lap and descending colostomy on 4/22 for perforated diverticular disease with abscess by Dr. Alphonsa Overall. Discharged on 4/29 with open dressing changes and colostomy care. Today with dehiscence of midline wound and protruding bowel.   - 5/3 pt s/p exploratory laparotomy with closure of fascial dehiscence - Pt reports a 16 lb wt loss since 06/13/14. (9%- significant for time frame) - Her reports having a good appetite and eating well.  - Current diet is clear liquids- pt tolerated gingerale and jello. Having trouble with po r/t pain.  - Pt educated on ways to add calories and protein to diet at home to prevent further weight loss.  - Agreed to try nutritional supplements once diet advanced.  Labs and medications reviewed   Na 134  Height:  Ht Readings from Last 1 Encounters:  06/18/14 '5\' 8"'$  (1.727 m)    Weight:  Wt Readings from Last 1 Encounters:  06/18/14 174 lb 2.6 oz (79 kg)    Ideal Body Weight:  70 kg  Wt Readings from Last 10 Encounters:  06/18/14 174 lb 2.6 oz (79 kg)    BMI:  Body mass index is 26.49 kg/(m^2).  Estimated Nutritional Needs:  Kcal:  2100-2300  Protein:  120-130 g  Fluid:  2.3 L/day  Skin:  Closed incision on abdomen  Diet Order:  Diet clear liquid Room service appropriate?: Yes; Fluid consistency::  Thin  EDUCATION NEEDS:  Education needs addressed   Intake/Output Summary (Last 24 hours) at 06/19/14 1354 Last data filed at 06/19/14 0609  Gross per 24 hour  Intake   1900 ml  Output   3615 ml  Net  -1715 ml    Last BM:  Prior to admission  Laurette Schimke [place], [place], [place]

## 2014-06-19 NOTE — Care Management Note (Signed)
Case Management Note  Patient Details  Name: [name] MRN: [id number] Date of Birth: 04/19/1970  Subjective/Objective:         Fascial dehiscence, partial evisceration.           Action/Plan: Home when stable  Expected Discharge Date:  [id number]              Expected Discharge Plan:  Home/Self Care  In-House Referral:  NA  Discharge planning Services  CM Consult  Post Acute Care Choice:    Choice offered to:     DME Arranged:    DME Agency:     HH Arranged:    HH Agency:     Status of Service:  In process, will continue to follow  Medicare Important Message Given:    Date Medicare IM Given:    Medicare IM give by:    Date Additional Medicare IM Given:    Additional Medicare Important Message give by:     If discussed at [place] of Stay Meetings, dates discussed:    Additional Comments:  Leeroy Cha, RN 06/19/2014, 10:26 AM

## 2014-06-19 NOTE — Op Note (Signed)
NAMEAQIB, LOUGH NO.:  [id number]  MEDICAL RECORD NO.:  [id number]  LOCATION:  12                         FACILITY:  [facility]  PHYSICIAN:  [name], MD      DATE OF BIRTH:  04-11-1970  DATE OF PROCEDURE:  06/18/2014                              OPERATIVE REPORT   PREOPERATIVE DIAGNOSIS:  Fascial dehiscence, partial evisceration.  POSTOPERATIVE DIAGNOSIS:  Fascial dehiscence, partial evisceration.  PROCEDURE: 1. Exploratory laparotomy. 2. Primary closure of fascial dehiscence.  SURGEON:  [name], MD, FACS  ANESTHESIA:  General.  ESTIMATED BLOOD LOSS:  Minimal.  PREPARATION:  Betadine.  COMPLICATIONS:  None.  INDICATIONS:  The patient is a 44 year old male with multiple medical issues who underwent exploratory laparotomy, sigmoid colectomy, and descending colostomy, on June 07, 2014 for perforated diverticular disease with abscess.  The patient was discharged on April 29 with dressing changes to an open midline abdominal wound.  On the day of admission, Jun 18, 2014, the patient experienced dehiscence of his abdominal wound with partial evisceration.  He presented to the emergency department and was prepared urgently for the operating room.  BODY OF REPORT:  Procedure was done in OR #1 at the [facility].  The patient was brought to the operating room, placed in supine position on the operating room table.  Following administration of general anesthesia, the patient was positioned and then prepped and draped in the usual aseptic fashion.  After ascertaining an adequate level of anesthesia had been achieved, the previous suture materials were extracted.  The wound was opened widely. Abdomen was irrigated with warm saline which was evacuated.  Good hemostasis was achieved.  With relaxation, the abdominal wall could be reapproximated.  The fascia was somewhat necrotic and irregular in its quality.  Interrupted #1 Novafil  simple sutures were placed. Intermittently #5 Ethibond fascial retention sutures with plastic bridges were placed.  This allows for complete closure of the abdominal wall. Subcutaneous tissues irrigated and Betadine soaked 4 x 4 gauze sponges were placed in the subcutaneous tissues.  Dry gauze followed by an ABD pad was placed as dressing.  The patient was awakened from anesthesia. He was brought to the recovery room in stable condition.  The patient tolerated the procedure well.   [name], MD, [facility] Office: [phone]     TMG/MEDQ  D:  06/18/2014  T:  06/19/2014  Job:  [id number]

## 2014-06-19 NOTE — Discharge Instructions (Signed)

## 2014-06-19 NOTE — Progress Notes (Signed)
1 Day Post-Op  Subjective: Pt with some pain overnight  Objective: Vital signs in last 24 hours: Temp:  [97.5 F (36.4 C)-98.1 F (36.7 C)] 98.1 F (36.7 C) (05/04 0400) Pulse Rate:  [77-95] 82 (05/04 0600) Resp:  [8-26] 13 (05/04 0600) BP: (157-200)/(90-120) 168/94 mmHg (05/04 0600) SpO2:  [97 %-100 %] 97 % (05/04 0600) Weight:  [79 kg (174 lb 2.6 oz)] 79 kg (174 lb 2.6 oz) (05/03 2230) Last BM Date: 06/18/14  Intake/Output from previous day: 05/03 0701 - 05/04 0700 In: 1900 [I.V.:1800; IV Piggyback:100] Out: 3615 [Urine:3565; Blood:50] Intake/Output this shift:    General appearance: alert and cooperative GI: s/appropttp, ND, dressing intact  Lab Results:   Recent Labs  06/18/14 1737 06/19/14 0350  WBC 16.4* 17.8*  HGB 14.0 15.3  HCT 41.3 44.6  PLT 282 304   BMET  Recent Labs  06/18/14 1737 06/19/14 0350  NA 134* 134*  K 3.5 4.1  CL 97* 97*  CO2 26 26  GLUCOSE 127* 221*  BUN 11 8  CREATININE 0.75 0.67  CALCIUM 8.8* 8.8*   PT/INR No results for input(s): LABPROT, INR in the last 72 hours. ABG No results for input(s): PHART, HCO3 in the last 72 hours.  Invalid input(s): PCO2, PO2  Studies/Results: Dg Abd Acute W/chest  06/18/2014   CLINICAL DATA:  44 year old male with abdominal pain and dehiscence of surgical abdominal wound.  EXAM: DG ABDOMEN ACUTE W/ 1V CHEST  COMPARISON:  None.  FINDINGS: Prominent thickening of the right paratracheal stripe with right-to-left of bowing of the airway suggesting right paratracheal adenopathy. Heart size is within normal limits. No discrete pulmonary mass or nodule. No acute osseous abnormality. Incompletely imaged cervical spine collar.  No evidence of free air on the up right radiograph. The bowel gas pattern is not obstructed. Left lower quadrant ostomy. No organomegaly or abnormal calcification.  IMPRESSION: 1. No acute cardiopulmonary process. 2. Suspect right paratracheal adenopathy or mass. Given known history of  malignancy, correlation with outside imaging is recommended. 3. Normal bowel gas pattern.  No evidence of obstruction.   Electronically Signed   By: Jacqulynn Cadet M.D.   On: 06/18/2014 17:53    Anti-infectives: Anti-infectives    Start     Dose/Rate Route Frequency Ordered Stop   06/19/14 0000  Ampicillin-Sulbactam (UNASYN) 3 g in sodium chloride 0.9 % 100 mL IVPB     3 g 100 mL/hr over 60 Minutes Intravenous 4 times per day 06/18/14 2231     06/18/14 1800  piperacillin-tazobactam (ZOSYN) IVPB 3.375 g     3.375 g 100 mL/hr over 30 Minutes Intravenous  Once 06/18/14 1750 06/18/14 1904      Assessment/Plan: s/p Procedure(s): EXPLORATORY LAPAROTOMY/ABDOMINAL WOUND DIHISCENCE (N/A) FASCIOTOMY CLOSURE (N/A) Advance diet to CLD Restart home Rx Add breakthrough pain Rx Mobilize today   LOS: 1 day    Rosario Jacks., Anne Hahn 06/19/2014

## 2014-06-20 MED ORDER — KETOROLAC TROMETHAMINE 30 MG/ML IJ SOLN: 30.0000 mg | Freq: Three times a day (TID) | INTRAMUSCULAR | Status: AC | PRN

## 2014-06-20 MED ORDER — NALOXONE HCL 0.4 MG/ML IJ SOLN: 0.4000 mg | INTRAMUSCULAR | Status: DC | PRN

## 2014-06-20 MED ORDER — DIPHENHYDRAMINE HCL 12.5 MG/5ML PO ELIX: 12.5000 mg | ORAL_SOLUTION | Freq: Four times a day (QID) | ORAL | Status: DC | PRN

## 2014-06-20 MED ORDER — OXYCODONE HCL 5 MG PO TABS: 2.5000 mg | ORAL_TABLET | ORAL | Status: DC | PRN

## 2014-06-20 MED ORDER — HYDROMORPHONE HCL 1 MG/ML IJ SOLN: 0.5000 mg | INTRAMUSCULAR | Status: DC | PRN

## 2014-06-20 MED ORDER — HYDROMORPHONE 0.3 MG/ML IV SOLN
INTRAVENOUS | Status: DC
  Administered 2014-06-20: 3.57 mg via INTRAVENOUS
  Administered 2014-06-20: 3.6 mg via INTRAVENOUS
  Administered 2014-06-20: 3.2 mg via INTRAVENOUS
  Administered 2014-06-20: 09:00:00 via INTRAVENOUS
  Administered 2014-06-21: 0.3 mg via INTRAVENOUS
  Administered 2014-06-21: 3 mg via INTRAVENOUS
  Administered 2014-06-21: 2.74 mg via INTRAVENOUS
  Administered 2014-06-21: 1.8 mg via INTRAVENOUS
  Administered 2014-06-21: 2.53 mg via INTRAVENOUS
  Administered 2014-06-21: 2.7 mg via INTRAVENOUS
  Administered 2014-06-21: 0.3 mg via INTRAVENOUS
  Administered 2014-06-21: 3.6 mg via INTRAVENOUS
  Administered 2014-06-22: 3 mg via INTRAVENOUS
  Administered 2014-06-22: 4.25 mg via INTRAVENOUS
  Administered 2014-06-22: 0.3 mg via INTRAVENOUS
  Administered 2014-06-22: 3.21 mg via INTRAVENOUS
  Filled 2014-06-20 (×6): qty 25

## 2014-06-20 MED ORDER — OXYCODONE-ACETAMINOPHEN 7.5-325 MG PO TABS: 1.0000 | ORAL_TABLET | ORAL | Status: DC | PRN

## 2014-06-20 MED ORDER — OXYCODONE-ACETAMINOPHEN 5-325 MG PO TABS: 1.0000 | ORAL_TABLET | ORAL | Status: DC | PRN

## 2014-06-20 MED ORDER — ONDANSETRON HCL 4 MG/2ML IJ SOLN: 4.0000 mg | Freq: Four times a day (QID) | INTRAMUSCULAR | Status: DC | PRN

## 2014-06-20 MED ORDER — SODIUM CHLORIDE 0.9 % IJ SOLN: 9.0000 mL | INTRAMUSCULAR | Status: DC | PRN

## 2014-06-20 MED ORDER — DIPHENHYDRAMINE HCL 50 MG/ML IJ SOLN: 12.5000 mg | Freq: Four times a day (QID) | INTRAMUSCULAR | Status: DC | PRN
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
## 2014-06-20 NOTE — Progress Notes (Signed)
Patient is stable at transfer. Report given to nurse.

## 2014-06-20 NOTE — Consult Note (Signed)
WOC ostomy consult note Stoma type/location: LUQ colostomy Stomal assessment/size: slightly larger than 1 and 3/4 inch oval with os at center, red, moist Peristomal assessment: intact, clear, suture intact Treatment options for stomal/peristomal skin:  Output: small amount of mucus Ostomy pouching: 2pc. 2 and 3/4 inch pouching system with skin barrier ring. Education provided: Patient is reassured that the pouching system is not leaking despite tape being wet.  Wetness appears to be from midline wound with is being treated with twice daily saline dressings and with plastic retention sutures. Patient is anxious and using PCA during visit.  Appears to get some comfort from inspecting stoma and in getting a new pouching system today. [place] nursing team will follow, and will remain available to this patient, the nursing, surgical and medical teams.   Thanks, Maudie Flakes, MSN, RN, [place], [place], [place] [phone])

## 2014-06-21 ENCOUNTER — Ambulatory Visit: Payer: Medicaid Other

## 2014-06-21 ENCOUNTER — Telehealth: Payer: Self-pay | Admitting: *Deleted

## 2014-06-21 ENCOUNTER — Inpatient Hospital Stay: Payer: Medicaid Other | Admitting: Family Medicine

## 2014-06-21 ENCOUNTER — Telehealth: Payer: Self-pay | Admitting: Hematology and Oncology

## 2014-06-21 ENCOUNTER — Inpatient Hospital Stay: Payer: Medicaid Other | Admitting: Hematology and Oncology

## 2014-06-21 MED ORDER — BOOST PLUS PO LIQD
237.0000 mL | Freq: Three times a day (TID) | ORAL | Status: DC
  Administered 2014-06-21 – 2014-06-22 (×2): 237 mL via ORAL
  Filled 2014-06-21 (×8): qty 237

## 2014-06-21 MED ORDER — PIPERACILLIN-TAZOBACTAM 3.375 G IVPB
3.3750 g | Freq: Three times a day (TID) | INTRAVENOUS | Status: DC
  Administered 2014-06-21 – 2014-06-23 (×6): 3.375 g via INTRAVENOUS
  Filled 2014-06-21 (×7): qty 50

## 2014-06-21 MED ORDER — HYDROMORPHONE HCL 1 MG/ML IJ SOLN
1.0000 mg | Freq: Once | INTRAMUSCULAR | Status: AC
  Administered 2014-06-21: 1 mg via INTRAVENOUS
  Filled 2014-06-21: qty 1

## 2014-06-21 NOTE — Anesthesia Postprocedure Evaluation (Signed)
  Anesthesia Post-op Note  Patient: [name]  Procedure(s) Performed: Procedure(s) (LRB): EXPLORATORY LAPAROTOMY/ABDOMINAL WOUND DIHISCENCE (N/A) FASCIOTOMY CLOSURE (N/A)  Patient Location: PACU  Anesthesia Type: General  Level of Consciousness: awake and alert   Airway and Oxygen Therapy: Patient Spontanous Breathing  Post-op Pain: mild  Post-op Assessment: Post-op Vital signs reviewed, Patient's Cardiovascular Status Stable, Respiratory Function Stable, Patent Airway and No signs of Nausea or vomiting  Last Vitals:  Filed Vitals:   06/21/14 0543  BP: 130/81  Pulse: 69  Temp: 36.6 C  Resp: 16    Post-op Vital Signs: stable   Complications: No apparent anesthesia complications

## 2014-06-21 NOTE — Consult Note (Addendum)
WOC ostomy follow up [facility] following for assessment and plan of care to abd wound. Stoma type/location: Pt has LUQ colostomy from previous admission.  He states his fiance has been independent with pouch application and emptying when at home. Stomal assessment/size: Stoma red and viable when visualized through pouch, which is intact with good seal and was applied yesterday. Output: Scant amt tan liquid in pouch at this time. Ostomy pouching: 2pc. with barrier ring to maintain seal  Education provided:  Pt denies further questions at this time regarding ostomy care. Supplies at bedside for staff nurse use. Please re-consult if further assistance is needed.  Thank-you,  Julien Girt MSN, [place], [place], [place], [place]

## 2014-06-21 NOTE — Progress Notes (Signed)
Patient ID: [name], male   DOB: 10-04-1970, 44 y.o.   MRN: [id number]     [facility]      [address] [place], [place] [address]    Phone: [phone] FAX: [phone]     Subjective: Pain.  Walking.  Looks comfortable.   Objective:  Vital signs:  Filed Vitals:   06/21/14 0328 06/21/14 0543 06/21/14 0800 06/21/14 1015  BP:  130/81  125/92  Pulse:  69  80  Temp:  97.9 F (36.6 C)  97.6 F (36.4 C)  TempSrc:  Oral  Oral  Resp: '16 16 16 16  '$ Height:      Weight:      SpO2: 96% 100% 98% 93%    Last BM Date: 06/18/14  Intake/Output   Yesterday:  05/05 0701 - 05/06 0700 In: 6270 [P.O.:2337; I.V.:2500; IV Piggyback:200] Out: 5100 [Urine:5100] This shift:  Total I/O In: 760 [P.O.:760] Out: 300 [Urine:300]  Physical Exam: General: Pt awake/alert/oriented x4 in no acute distress Chest: cta. No chest wall pain w good excursion CV:  Pulses intact.  Regular rhythm MS: Normal AROM mjr joints.  No obvious deformity Abdomen: Soft.  Nondistended.  appropriatively tender.  Midline wound is cd/i.  No evidence of peritonitis.  No incarcerated hernias. Ext:  SCDs BLE.  No mjr edema.  No cyanosis Skin: No petechiae / purpura   Problem List:   Principal Problem:   Dehiscence of surgical wound Active Problems:   Fascial defect    Results:   Labs: No results found for this or any previous visit (from the past 48 hour(s)).  Imaging / Studies: No results found.  Medications / Allergies:  Scheduled Meds: . ampicillin-sulbactam (UNASYN) IV  3 g Intravenous 4 times per day  . cloNIDine  0.1 mg Oral TID  . dexamethasone  4 mg Oral 4 times per day  . enoxaparin (LOVENOX) injection  40 mg Subcutaneous Q24H  . furosemide  20 mg Oral Daily  .  HYDROmorphone (DILAUDID) injection  1 mg Intravenous Once  . HYDROmorphone PCA 0.3 mg/mL   Intravenous 6 times per day  . lactose free nutrition  237 mL Oral TID WC  .  levETIRAcetam  500 mg Oral BID  . pantoprazole (PROTONIX) IV  40 mg Intravenous QHS  . polyethylene glycol  17 g Oral Daily   Continuous Infusions: . dextrose 5 % and 0.45 % NaCl with KCl 20 mEq/L 1,000 mL (06/21/14 0000)   PRN Meds:.acetaminophen **OR** acetaminophen, diphenhydrAMINE **OR** diphenhydrAMINE, HYDROmorphone (DILAUDID) injection, ketorolac, labetalol, naloxone **AND** sodium chloride, ondansetron, ondansetron (ZOFRAN) IV, oxyCODONE-acetaminophen **AND** oxyCODONE  Antibiotics: Anti-infectives    Start     Dose/Rate Route Frequency Ordered Stop   06/19/14 0000  Ampicillin-Sulbactam (UNASYN) 3 g in sodium chloride 0.9 % 100 mL IVPB     3 g 100 mL/hr over 60 Minutes Intravenous 4 times per day 06/18/14 2231     06/18/14 1800  piperacillin-tazobactam (ZOSYN) IVPB 3.375 g     3.375 g 100 mL/hr over 30 Minutes Intravenous  Once 06/18/14 1750 06/18/14 1904        Assessment/Plan POD#3 exploratory laparotomy, fasciotomy closure for abdominal wound dehiscence---Dr. Harlow Asa -allow for fulls and add boost -WOC for ostomy care/teaching  -BID wet to dry dressing changes -DC foley -mobilize  -will need to transition him off PCA, but pt using a fair amount in addition to percocet +oxy IR ID-on Unasyn, previous intra op wound culture was  resistant  to this.  Will therefore change to zosyn.  If atbx are needed at DC can do bactrim which is sensitive to both e coli and strep viridans VTE prophylaxis--SCD/lovenox   Please note, the patient has 2 charts   Erby Pian, [facility] Pager [phone](7A-4:30P)   06/21/2014 12:07 PM

## 2014-06-21 NOTE — Telephone Encounter (Signed)
Pt is in hospital under another account number.  Dr. Alvy Bimler is aware.

## 2014-06-21 NOTE — Progress Notes (Deleted)
°  Radiation Oncology         [phone] ________________________________  Name: [name] MRN: [id number]  Date: 06/14/2014  DOB: 1970-07-31  End of Treatment Note   ICD-9-CM ICD-10-CM    1. Small cell lung cancer, right 162.9 C34.91   2. 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  Beams/energy:   Lateral fields, 6 MV photons  Narrative: The patient tolerated radiation treatment relatively well.   His confusion improved during the course of tx.    Plan: The patient has completed radiation treatment. The patient will return to radiation oncology clinic for routine followup in one month. I advised them to call or return sooner if they have any questions or concerns related to their recovery or treatment.  -----------------------------------  Blair Promise, PhD, MD

## 2014-06-21 NOTE — Telephone Encounter (Signed)
Left VM for pt regarding appt today.  Pt has not shown up yet.  Instructed pt to come in today as scheduled.  Dr. Alvy Bimler is booked up for the next two weeks.  Asked him to please call us back to let us know if he is able to keep his appt today.

## 2014-06-21 NOTE — Telephone Encounter (Signed)
S.w. Pt and advised on May 19 appt...the patient ok and aware

## 2014-06-21 NOTE — Telephone Encounter (Signed)
Called pt left vm in ref to hosp. F/u on 07/04/14'@12'$ :00-per Dr.Gorsuch

## 2014-06-22 LAB — CBC
HCT: 40.6 % (ref 39.0–52.0)
Hemoglobin: 13.7 g/dL (ref 13.0–17.0)
MCH: 31.1 pg (ref 26.0–34.0)
MCHC: 33.7 g/dL (ref 30.0–36.0)
MCV: 92.1 fL (ref 78.0–100.0)
Platelets: 344 10*3/uL (ref 150–400)
RBC: 4.41 MIL/uL (ref 4.22–5.81)
RDW: 12.6 % (ref 11.5–15.5)
WBC: 13.3 10*3/uL — AB (ref 4.0–10.5)

## 2014-06-22 MED ORDER — HYDROMORPHONE HCL 1 MG/ML IJ SOLN
0.5000 mg | INTRAMUSCULAR | Status: DC | PRN
  Administered 2014-06-22 – 2014-06-23 (×12): 1 mg via INTRAVENOUS
  Filled 2014-06-22 (×11): qty 1

## 2014-06-22 MED ORDER — PANTOPRAZOLE SODIUM 40 MG PO TBEC
40.0000 mg | DELAYED_RELEASE_TABLET | Freq: Every day | ORAL | Status: DC
  Administered 2014-06-22: 40 mg via ORAL
  Filled 2014-06-22 (×3): qty 1

## 2014-06-22 MED ORDER — OXYCODONE-ACETAMINOPHEN 7.5-325 MG PO TABS
1.0000 | ORAL_TABLET | ORAL | Status: DC | PRN
  Administered 2014-06-22 – 2014-06-23 (×6): 2 via ORAL
  Filled 2014-06-22 (×6): qty 2

## 2014-06-22 NOTE — Progress Notes (Signed)
Patient ID: [name], male   DOB: 1970/11/17, 44 y.o.   MRN: [id number]     [facility]      [address] [place], [place] [address]    Phone: [phone] FAX: [phone]     Subjective: Pain.  Walking.  Looks comfortable.   Objective:  Vital signs:  Filed Vitals:   06/22/14 0401 06/22/14 0520 06/22/14 0649 06/22/14 0826  BP:  128/82    Pulse:  91    Temp:  97.4 F (36.3 C)    TempSrc:  Oral    Resp: '16 18 16 20  '$ Height:      Weight:      SpO2: 95% 93% 96% 92%    Last BM Date: 06/18/14  Intake/Output   Yesterday:  05/06 0701 - 05/07 0700 In: 1751 [P.O.:2180; I.V.:2100; IV Piggyback:100] Out: 0258 [Urine:3625; Stool:650] This shift:     Physical Exam: General: Pt awake/alert/oriented x4 in no acute distress MS: Normal AROM mjr joints.  No obvious deformity Abdomen: Soft.  Nondistended.  appropriatively tender.  Midline wound dressing is cd/i.  No evidence of peritonitis.   Ostomy: functioning well Ext:  SCDs BLE.  No mjr edema.  No cyanosis Skin: No petechiae / purpura   Problem List:   Principal Problem:   Dehiscence of surgical wound Active Problems:   Fascial defect    Results:   Labs: Results for orders placed or performed during the hospital encounter of 06/18/14 (from the past 48 hour(s))  CBC     Status: Abnormal   Collection Time: 06/22/14  4:50 AM  Result Value Ref Range   WBC 13.3 (H) 4.0 - 10.5 K/uL   RBC 4.41 4.22 - 5.81 MIL/uL   Hemoglobin 13.7 13.0 - 17.0 g/dL   HCT 40.6 39.0 - 52.0 %   MCV 92.1 78.0 - 100.0 fL   MCH 31.1 26.0 - 34.0 pg   MCHC 33.7 30.0 - 36.0 g/dL   RDW 12.6 11.5 - 15.5 %   Platelets 344 150 - 400 K/uL    Imaging / Studies: No results found.  Medications / Allergies:  Scheduled Meds: . cloNIDine  0.1 mg Oral TID  . dexamethasone  4 mg Oral 4 times per day  . enoxaparin (LOVENOX) injection  40 mg Subcutaneous Q24H  . furosemide  20 mg Oral Daily  .  HYDROmorphone PCA 0.3 mg/mL   Intravenous 6 times per day  . lactose free nutrition  237 mL Oral TID WC  . levETIRAcetam  500 mg Oral BID  . pantoprazole (PROTONIX) IV  40 mg Intravenous QHS  . piperacillin-tazobactam (ZOSYN)  IV  3.375 g Intravenous Q8H  . polyethylene glycol  17 g Oral Daily   Continuous Infusions:   PRN Meds:.acetaminophen **OR** acetaminophen, diphenhydrAMINE **OR** diphenhydrAMINE, HYDROmorphone (DILAUDID) injection, ketorolac, labetalol, naloxone **AND** sodium chloride, ondansetron, ondansetron (ZOFRAN) IV, [DISCONTINUED] oxyCODONE-acetaminophen **AND** oxyCODONE, oxyCODONE-acetaminophen  Antibiotics: Anti-infectives    Start     Dose/Rate Route Frequency Ordered Stop   06/21/14 1300  piperacillin-tazobactam (ZOSYN) IVPB 3.375 g     3.375 g 12.5 mL/hr over 240 Minutes Intravenous Every 8 hours 06/21/14 1205 06/28/14 1259   06/19/14 0000  Ampicillin-Sulbactam (UNASYN) 3 g in sodium chloride 0.9 % 100 mL IVPB  Status:  Discontinued     3 g 100 mL/hr over 60 Minutes Intravenous 4 times per day 06/18/14 2231 06/21/14 1205   06/18/14 1800  piperacillin-tazobactam (ZOSYN) IVPB 3.375 g  3.375 g 100 mL/hr over 30 Minutes Intravenous  Once 06/18/14 1750 06/18/14 1904        Assessment/Plan POD#4 exploratory laparotomy, fasciotomy closure for abdominal wound dehiscence---Dr. Harlow Asa -advance to soft diet -WOC for ostomy care/teaching  -BID wet to dry dressing changes -mobilize  -d/c PCA, start PO pain meds SL IV ID-on Unasyn, previous intra op wound culture was resistant  to this.  Will therefore change to zosyn.  If atbx are needed at DC can do bactrim which is sensitive to both e coli and strep viridans VTE prophylaxis--SCD/lovenox   Please note, the patient has 2 charts   Rosario Adie, MD  [facility]   06/22/2014 11:13 AM

## 2014-06-23 MED ORDER — DEXAMETHASONE 4 MG PO TABS: 4.0000 mg | ORAL_TABLET | Freq: Four times a day (QID) | ORAL | Status: DC

## 2014-06-23 MED ORDER — OXYCODONE-ACETAMINOPHEN 7.5-325 MG PO TABS: 1.0000 | ORAL_TABLET | ORAL | Status: DC | PRN

## 2014-06-23 MED ORDER — CIPROFLOXACIN HCL 500 MG PO TABS: 500.0000 mg | ORAL_TABLET | Freq: Two times a day (BID) | ORAL | Status: DC

## 2014-06-23 NOTE — Plan of Care (Signed)
Problem: Phase I Progression Outcomes Goal: Sutures/staples intact Outcome: Completed/Met Date Met:  06/23/14 retetion sutures

## 2014-06-23 NOTE — Progress Notes (Signed)
5 Days Post-Op  Subjective: No complaints. Ready to go home  Objective: Vital signs in last 24 hours: Temp:  [97.7 F (36.5 C)-97.8 F (36.6 C)] 97.8 F (36.6 C) (05/08 0515) Pulse Rate:  [77-88] 88 (05/08 0515) Resp:  [16-20] 20 (05/08 0515) BP: (135-156)/(91-92) 156/91 mmHg (05/08 0515) SpO2:  [97 %-99 %] 97 % (05/08 0515) Last BM Date: 06/22/14  Intake/Output from previous day: 05/07 0701 - 05/08 0700 In: 2636.7 [P.O.:1930; I.V.:606.7; IV Piggyback:100] Out: 3350 [Urine:2900; Stool:450] Intake/Output this shift: Total I/O In: 240 [P.O.:240] Out: 800 [Urine:800]  Resp: clear to auscultation bilaterally Cardio: regular rate and rhythm GI: soft, nontender. wound clean. ostomy pink and productive  Lab Results:   Recent Labs  06/22/14 0450  WBC 13.3*  HGB 13.7  HCT 40.6  PLT 344   BMET No results for input(s): NA, K, CL, CO2, GLUCOSE, BUN, CREATININE, CALCIUM in the last 72 hours. PT/INR No results for input(s): LABPROT, INR in the last 72 hours. ABG No results for input(s): PHART, HCO3 in the last 72 hours.  Invalid input(s): PCO2, PO2  Studies/Results: No results found.  Anti-infectives: Anti-infectives    Start     Dose/Rate Route Frequency Ordered Stop   06/21/14 1300  piperacillin-tazobactam (ZOSYN) IVPB 3.375 g     3.375 g 12.5 mL/hr over 240 Minutes Intravenous Every 8 hours 06/21/14 1205 06/28/14 1259   06/19/14 0000  Ampicillin-Sulbactam (UNASYN) 3 g in sodium chloride 0.9 % 100 mL IVPB  Status:  Discontinued     3 g 100 mL/hr over 60 Minutes Intravenous 4 times per day 06/18/14 2231 06/21/14 1205   06/18/14 1800  piperacillin-tazobactam (ZOSYN) IVPB 3.375 g     3.375 g 100 mL/hr over 30 Minutes Intravenous  Once 06/18/14 1750 06/18/14 1904      Assessment/Plan: s/p Procedure(s): EXPLORATORY LAPAROTOMY/ABDOMINAL WOUND DIHISCENCE (N/A) FASCIOTOMY CLOSURE (N/A) Discharge  LOS: 5 days    TOTH III,PAUL S 06/23/2014

## 2014-06-23 NOTE — Care Management Note (Signed)
Case Management Note  Patient Details  Name: Karmine Kauer MRN: [id number] Date of Birth: May 10, 1970  Subjective/Objective:                    Action/Plan:   Expected Discharge Date:   (unknown)               Expected Discharge Plan:  [place]  In-House Referral:  NA  Discharge planning Services  CM Consult  Post Acute Care Choice:  Home Health Choice offered to:      [facility] Arranged:  RN [facility] Agency:  [place]  Status of Service:  Completed, signed off  Medicare Important Message Given:  No Date Medicare IM Given:    Medicare IM give by:    Date Additional Medicare IM Given:    Additional Medicare Important Message give by:     If discussed at [place] of Stay Meetings, dates discussed:    Additional Comments: NCM notified [facility] of resumption of care for [facility] RN. Contacted [facility] for roc orders.  Jonnie Finner RN CCM Case Mgmt phone [phone] Erenest Rasher, RN 06/23/2014, 12:51 PM

## 2014-06-23 NOTE — Progress Notes (Signed)
As I got ready to hang pt's scheduled Zosyn he stated that "the doctor said she was going to change me to pills". I rechecked the mar, and read the MD's notes, and there was no mention of discontinuing the IV antibiotic. She did chart that upon discharge pt would possibly be changed to po Bactrim. Pt still insisted that he was no longer supposed to have IV antibiotic. I called the MD on call and advised him of the situation, and he did not  change the order. I reported this to pt, and he still insisted that he we was supposed to be on "pill now, but agreed to let me hang the Zosyn.

## 2014-06-24 ENCOUNTER — Encounter (HOSPITAL_COMMUNITY): Payer: Self-pay | Admitting: Surgery

## 2014-06-26 ENCOUNTER — Other Ambulatory Visit: Payer: Self-pay | Admitting: Neurosurgery

## 2014-06-26 DIAGNOSIS — M542 Cervicalgia: Secondary | ICD-10-CM
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
## 2014-06-26 NOTE — Progress Notes (Signed)
  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   Confusion improved during course of tx  Plan: The patient has completed radiation treatment. The patient will return to radiation oncology clinic for routine followup in one month. I advised them to call or return sooner if they have any questions or concerns related to their recovery or treatment.  -----------------------------------  Blair Promise, PhD, MD

## 2014-06-28 ENCOUNTER — Other Ambulatory Visit: Payer: Self-pay | Admitting: Family Medicine

## 2014-06-28 ENCOUNTER — Telehealth: Payer: Self-pay | Admitting: *Deleted

## 2014-06-28 ENCOUNTER — Telehealth: Payer: Self-pay | Admitting: Hematology and Oncology

## 2014-06-28 MED ORDER — DEXAMETHASONE 4 MG PO TABS: 4.0000 mg | ORAL_TABLET | Freq: Four times a day (QID) | ORAL | Status: DC

## 2014-06-28 NOTE — Telephone Encounter (Signed)
pt lvm to r/s new pt appt....sent msg to tiffany and kim to r/s

## 2014-06-28 NOTE — Telephone Encounter (Signed)
Pt informs he is taking Dexamethasone 4 mg every 6 hrs.  Confirmed dose w/ Dr. Alvy Bimler and ok to refill for 30 tablets which will be enough for pt until he can see Dr. Alvy Bimler in office on 5/19 as scheduled.  Informed pt of refill Dexamethasone sent to [facility] on [address].  Reminded pt of appt w/ Dr. Alvy Bimler on 5/9 at noon.  Pt sounded agitated, voicing a lot of frustration and how difficult it has been to get this refill.  He also stated he had a lot of appts next week which "overlap" and not sure how to manage them.  Asked pt to please let us know if we can change our appt time, otherwise please come as scheduled.  He verbalized understanding.

## 2014-06-28 NOTE — Telephone Encounter (Signed)
Patient called to request a refill on dexamethasone (DECADRON) 4 MG tablet. Patient has an upcoming appointment on 5/17, but states that the medication is very important for him to take. Please f/u

## 2014-06-28 NOTE — Telephone Encounter (Signed)
Ok to refill X 1, 30 tabs I am not sure what his doses are.

## 2014-06-28 NOTE — Telephone Encounter (Signed)
TC from patient regarding a refill for his dexamthasone . He needs a refill today. His last refill by Dr. Marlou Starks was for only 20 tablets on 06/23/14, so he runs out today.  He is very desperate to get these refilled.  His new pt appt with Dr. Alvy Bimler is on 07/04/14 [facility] [address].

## 2014-07-01 NOTE — Telephone Encounter (Signed)
Nurse called patient, reached voicemail. Left message for patient to call [name] at [phone].   

## 2014-07-01 NOTE — Telephone Encounter (Signed)
Patient returned call to nurse, oncology refilled Decadron on Friday. Nurse reminded patient of appointment tomorrow at 11am. Patient agrees to come to appointment.

## 2014-07-02 ENCOUNTER — Telehealth: Payer: Self-pay | Admitting: *Deleted

## 2014-07-02 ENCOUNTER — Encounter: Payer: Self-pay | Admitting: Family Medicine

## 2014-07-02 ENCOUNTER — Ambulatory Visit: Payer: Medicaid Other | Attending: Family Medicine | Admitting: Family Medicine

## 2014-07-02 ENCOUNTER — Other Ambulatory Visit: Payer: Self-pay | Admitting: *Deleted

## 2014-07-02 VITALS — BP 125/87 | HR 94 | Temp 97.5°F | Resp 18 | Ht 66.0 in | Wt 155.0 lb

## 2014-07-02 DIAGNOSIS — C3491 Malignant neoplasm of unspecified part of right bronchus or lung: Secondary | ICD-10-CM

## 2014-07-02 DIAGNOSIS — Z9889 Other specified postprocedural states: Secondary | ICD-10-CM

## 2014-07-02 DIAGNOSIS — Z7952 Long term (current) use of systemic steroids: Secondary | ICD-10-CM | POA: Diagnosis not present

## 2014-07-02 DIAGNOSIS — G8929 Other chronic pain: Secondary | ICD-10-CM

## 2014-07-02 DIAGNOSIS — Z8619 Personal history of other infectious and parasitic diseases: Secondary | ICD-10-CM | POA: Insufficient documentation

## 2014-07-02 DIAGNOSIS — Z87891 Personal history of nicotine dependence: Secondary | ICD-10-CM | POA: Diagnosis not present

## 2014-07-02 DIAGNOSIS — S14109D Unspecified injury at unspecified level of cervical spinal cord, subsequent encounter: Secondary | ICD-10-CM | POA: Insufficient documentation

## 2014-07-02 DIAGNOSIS — T8130XD Disruption of wound, unspecified, subsequent encounter: Secondary | ICD-10-CM | POA: Insufficient documentation

## 2014-07-02 DIAGNOSIS — Z933 Colostomy status: Secondary | ICD-10-CM | POA: Insufficient documentation

## 2014-07-02 DIAGNOSIS — S129XXD Fracture of neck, unspecified, subsequent encounter: Secondary | ICD-10-CM

## 2014-07-02 DIAGNOSIS — R7303 Prediabetes: Secondary | ICD-10-CM | POA: Insufficient documentation

## 2014-07-02 DIAGNOSIS — C3490 Malignant neoplasm of unspecified part of unspecified bronchus or lung: Secondary | ICD-10-CM | POA: Diagnosis not present

## 2014-07-02 DIAGNOSIS — Z9049 Acquired absence of other specified parts of digestive tract: Secondary | ICD-10-CM | POA: Diagnosis not present

## 2014-07-02 DIAGNOSIS — E099 Drug or chemical induced diabetes mellitus without complications: Secondary | ICD-10-CM | POA: Diagnosis not present

## 2014-07-02 DIAGNOSIS — T8131XD Disruption of external operation (surgical) wound, not elsewhere classified, subsequent encounter: Secondary | ICD-10-CM

## 2014-07-02 DIAGNOSIS — E119 Type 2 diabetes mellitus without complications: Secondary | ICD-10-CM

## 2014-07-02 DIAGNOSIS — I10 Essential (primary) hypertension: Secondary | ICD-10-CM | POA: Insufficient documentation

## 2014-07-02 DIAGNOSIS — C7931 Secondary malignant neoplasm of brain: Secondary | ICD-10-CM | POA: Diagnosis not present

## 2014-07-02 DIAGNOSIS — Z48815 Encounter for surgical aftercare following surgery on the digestive system: Secondary | ICD-10-CM | POA: Diagnosis not present

## 2014-07-02 DIAGNOSIS — Z792 Long term (current) use of antibiotics: Secondary | ICD-10-CM | POA: Insufficient documentation

## 2014-07-02 DIAGNOSIS — T380X5A Adverse effect of glucocorticoids and synthetic analogues, initial encounter: Secondary | ICD-10-CM | POA: Insufficient documentation

## 2014-07-02 DIAGNOSIS — C787 Secondary malignant neoplasm of liver and intrahepatic bile duct: Secondary | ICD-10-CM | POA: Diagnosis not present

## 2014-07-02 DIAGNOSIS — Z923 Personal history of irradiation: Secondary | ICD-10-CM | POA: Diagnosis not present

## 2014-07-02 DIAGNOSIS — C7949 Secondary malignant neoplasm of other parts of nervous system: Secondary | ICD-10-CM

## 2014-07-02 DIAGNOSIS — C7951 Secondary malignant neoplasm of bone: Secondary | ICD-10-CM | POA: Diagnosis not present

## 2014-07-02 LAB — POCT GLUCOSE (DEVICE FOR HOME USE): POC Glucose: 153 mg/dl — AB (ref 70–99)

## 2014-07-02 LAB — POCT GLYCOSYLATED HEMOGLOBIN (HGB A1C): HEMOGLOBIN A1C: 7.2

## 2014-07-02 MED ORDER — KETOROLAC TROMETHAMINE 60 MG/2ML IM SOLN
60.0000 mg | Freq: Once | INTRAMUSCULAR | Status: AC
  Administered 2014-07-02: 60 mg via INTRAMUSCULAR

## 2014-07-02 MED ORDER — OXYCODONE-ACETAMINOPHEN 7.5-325 MG PO TABS: 1.0000 | ORAL_TABLET | ORAL | Status: DC | PRN

## 2014-07-02 MED ORDER — ACCU-CHEK SOFTCLIX LANCET DEV MISC: Status: DC

## 2014-07-02 MED ORDER — METFORMIN HCL 500 MG PO TABS: 500.0000 mg | ORAL_TABLET | Freq: Two times a day (BID) | ORAL | Status: DC

## 2014-07-02 MED ORDER — GLUCOSE BLOOD VI STRP: ORAL_STRIP | Status: DC

## 2014-07-02 MED ORDER — CLONIDINE HCL 0.1 MG PO TABS: 0.1000 mg | ORAL_TABLET | Freq: Two times a day (BID) | ORAL | Status: DC

## 2014-07-02 NOTE — Telephone Encounter (Signed)
PT. HERE FOR PAIN MEDICATION. SPOKE WITH DR.GORSUCH'S NURSE, [facility]. NOTIFIED PT. THAT WHEN DR.GORSUCH COMPLETES HER VISIT WITH THIS PT. SHE WILL ADDRESS PT.'S NEED FOR PAIN MEDICATION. PT. VOICES UNDERSTANDING.

## 2014-07-02 NOTE — Progress Notes (Signed)
Patient had severe stomach pains during night, went to ED, removed 8 inches of colon. Has had colon revision. Patient is out of pain medication. Patient needs refill on Keppra.

## 2014-07-02 NOTE — Progress Notes (Signed)
Quick Note:  Labs addressed at office as well as medications and patient was made aware. ______ 
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
## 2014-07-02 NOTE — Patient Instructions (Addendum)
Colostomy Home Guide A colostomy is an opening for stool to leave your body when a medical condition prevents it from leaving through the usual opening (rectum). During a surgery, a piece of large intestine (colon) is brought through a hole in the abdominal wall. The new opening is called a stoma or ostomy. A bag or pouch fits over the stoma to catch stool and gas. Your stool may be liquid, somewhat pasty, or formed. CARING FOR YOUR STOMA  Normally, the stoma looks a lot like the inside of your cheek: pink, red, and moist. At first it may be swollen, but this swelling will decrease within 6 weeks. Keep the skin around your stoma clean and dry. You can gently wash your stoma and the skin around your stoma in the shower with a clean, soft washcloth. If you develop any skin irritation, your caregiver may give you a stoma powder or ointment to help heal the area. Do not use any products other than those specifically given to you by your caregiver.  Your stoma should not be uncomfortable. If you notice any stinging or burning, your pouch may be leaking, and the skin around your stoma may be coming into contact with stool. This can cause skin irritation. If you notice stinging, replace your pouch with a new one and discard the old one. OSTOMY POUCHES  The pouch that fits over the ostomy can be made up of either 1 or 2 pieces. A one-piece pouch has a skin barrier piece and the pouch itself in one unit. A two-piece pouch has a skin barrier with a separate pouch that snaps on and off of the skin barrier. Either way, you should empty the pouch when it is only  to  full. Do not let more stool or gas build up. This could cause the pouch to leak. Some ostomy bags have a built-in gas release valve. Ostomy deodorizer (5 drops) can be put into the pouch to prevent odor. Some people use ostomy lubricant drops inside the pouch to help the stool slide out of the bag more easily and completely.  EMPTYING YOUR OSTOMY POUCH   You may get lessons on how to empty your pouch from a wound-ostomy nurse before you leave the hospital. Here are the basic steps:  Wash your hands with soap and water.  Sit far back on the toilet.  Put several pieces of toilet paper into the toilet water. This will prevent splashing as you empty the stool into the toilet bowl.  Unclip or unvelcro the tail end of the pouch.  Unroll the tail and empty stool into the toilet.  Clean the tail with toilet paper.  Reroll the tail, and clip or velcro it closed.  Wash your hands again. CHANGING YOUR OSTOMY POUCH  Change your ostomy pouch about every 3 to 4 days for the first 6 weeks, then every 5 to7 days. Always change the bag sooner if there is any leakage or you begin to notice any discomfort or irritation of the skin around the stoma. When possible, plan to change your ostomy pouch before eating or drinking as this will lessen the chance of stool coming out during the pouch change. A wound-ostomy nurse may teach you how to change your pouch before you leave the hospital. Here are the basic steps:  Lay out your supplies.  Wash your hands with soap and water.  Carefully remove the old pouch.  Wash the stoma and allow it to dry. Men may be advised  to shave any hair around the stoma very carefully. This will make the adhesive stick better.  Use the stoma measuring guide that comes with your pouch set to decide what size hole you will need to cut in the skin barrier piece. Choose the smallest possible size that will hold the stoma but will not touch it.  Use the guide to trace the circle on the back of the skin barrier piece. Cut out the hole.  Hold the skin barrier piece over the stoma to make sure the hole is the correct size.  Remove the adhesive paper backing from the skin barrier piece.  Squeeze stoma paste around the opening of the skin barrier piece.  Clean and dry the skin around the stoma again.  Carefully fit the skin  barrier piece over your stoma.  If you are using a two-piece pouch, snap the pouch onto the skin barrier piece.  Close the tail of the pouch.  Put your hand over the top of the skin barrier piece to help warm it for about 5 minutes, so that it conforms to your body better.  Wash your hands again. DIET TIPS   Continue to follow your usual diet.  Drink about eight 8 oz glasses of water each day.  You can prevent gas by eating slowly and chewing your food thoroughly.  If you feel concerned that you have too much gas, you can cut back on gas-producing foods, such as:  Spicy foods.  Onions and garlic.  Cruciferous vegetables (cabbage, broccoli, cauliflower, Brussels sprouts).  Beans and legumes.  Some cheeses.  Eggs.  Fish.  Bubbly (carbonated) drinks.  Chewing gum. GENERAL TIPS   You can shower with or without the bag in place.  Always keep the bag on if you are bathing or swimming.  If your bag gets wet, you can dry it with a blow-dryer set to cool.  Avoid wearing tight clothing directly over your stoma so that it does not become irritated or bleed. Tight clothing can also prevent stool from draining into the pouch.  It is helpful to always have an extra skin barrier and pouch with you when traveling. Do not leave them anywhere too warm, as parts of them can melt.  Do not let your seat belt rest on your stoma. Try to keep the seat belt either above or below your stoma, or use a tiny pillow to cushion it.  You can still participate in sports, but you should avoid activities in which there is a risk of getting hit in the abdomen.  You can still have sex. It is a good idea to empty your pouch prior to sex. Some people and their partners feel very comfortable seeing the pouch during sex. Others choose to wear lingerie or a T-shirt that covers the device. SEEK IMMEDIATE MEDICAL CARE IF:  You notice a change in the size or color of the stoma, especially if it becomes  very red, purple, black, or pale white.  You have bloody stools or bleeding from the stoma.  You have abdominal pain, nausea, vomiting, or bloating.  There is anything unusual protruding from the stoma.  You have irritation or red skin around the stoma.  No stool is passing from the stoma.  You have diarrhea (requiring more frequent than normal pouch emptying). Document Released: 02/04/2003 Document Revised: 04/26/2011 Document Reviewed: 07/01/2010 [facility] [address] Patient Information 2015 [place], [place]. This information is not intended to replace advice given to you by your health care provider.  Make sure you discuss any questions you have with your health care provider. diabeteDiabetes Mellitus and Food It is important for you to manage your blood sugar (glucose) level. Your blood glucose level can be greatly affected by what you eat. Eating healthier foods in the appropriate amounts throughout the day at about the same time each day will help you control your blood glucose level. It can also help slow or prevent worsening of your diabetes mellitus. Healthy eating may even help you improve the level of your blood pressure and reach or maintain a healthy weight.  HOW CAN FOOD AFFECT ME? Carbohydrates Carbohydrates affect your blood glucose level more than any other type of food. Your dietitian will help you determine how many carbohydrates to eat at each meal and teach you how to count carbohydrates. Counting carbohydrates is important to keep your blood glucose at a healthy level, especially if you are using insulin or taking certain medicines for diabetes mellitus. Alcohol Alcohol can cause sudden decreases in blood glucose (hypoglycemia), especially if you use insulin or take certain medicines for diabetes mellitus. Hypoglycemia can be a life-threatening condition. Symptoms of hypoglycemia (sleepiness, dizziness, and disorientation) are similar to symptoms of having too much alcohol.  If your  health care provider has given you approval to drink alcohol, do so in moderation and use the following guidelines:  Women should not have more than one drink per day, and men should not have more than two drinks per day. One drink is equal to:  12 oz of beer.  5 oz of wine.  1 oz of hard liquor.  Do not drink on an empty stomach.  Keep yourself hydrated. Have water, diet soda, or unsweetened iced tea.  Regular soda, juice, and other mixers might contain a lot of carbohydrates and should be counted. WHAT FOODS ARE NOT RECOMMENDED? As you make food choices, it is important to remember that all foods are not the same. Some foods have fewer nutrients per serving than other foods, even though they might have the same number of calories or carbohydrates. It is difficult to get your body what it needs when you eat foods with fewer nutrients. Examples of foods that you should avoid that are high in calories and carbohydrates but low in nutrients include:  Trans fats (most processed foods list trans fats on the Nutrition Facts label).  Regular soda.  Juice.  Candy.  Sweets, such as cake, pie, doughnuts, and cookies.  Fried foods. WHAT FOODS CAN I EAT? Have nutrient-rich foods, which will nourish your body and keep you healthy. The food you should eat also will depend on several factors, including:  The calories you need.  The medicines you take.  Your weight.  Your blood glucose level.  Your blood pressure level.  Your cholesterol level. You also should eat a variety of foods, including:  Protein, such as meat, poultry, fish, tofu, nuts, and seeds (lean animal proteins are best).  Fruits.  Vegetables.  Dairy products, such as milk, cheese, and yogurt (low fat is best).  Breads, grains, pasta, cereal, rice, and beans.  Fats such as olive oil, trans fat-free margarine, canola oil, avocado, and olives. DOES EVERYONE WITH DIABETES MELLITUS HAVE THE SAME MEAL  PLAN? Because every person with diabetes mellitus is different, there is not one meal plan that works for everyone. It is very important that you meet with a dietitian who will help you create a meal plan that is just right for you. Document Released:  10/29/2004 Document Revised: 02/06/2013 Document Reviewed: 12/29/2012 [employer] Patient Information 2015 [place], [place]. This information is not intended to replace advice given to you by your health care provider. Make sure you discuss any questions you have with your health care provider.

## 2014-07-02 NOTE — Telephone Encounter (Signed)
Pt in lobby requesting refill on Percocet 7.5 mg/ 325 mg.   States taking every 4 hrs and has run out.   Rx given for twenty tablets and he asked for thirty stating a lot of concern he may run out before his appt w/ Dr. Alvy Bimler in 2 days.  Dr. Alvy Bimler agreed to increase amt to thirty tablets.  Ripped up Rx for the #20 tablets and Gave him the Rx for percocet 7.5/325 mg #30.  S/w to pt and fiance in lobby and they confirmed his appt with Dr. Alvy Bimler on this Thursday.

## 2014-07-03 ENCOUNTER — Ambulatory Visit
Admission: RE | Admit: 2014-07-03 | Discharge: 2014-07-03 | Disposition: A | Discharge status: Home / Self Care | Payer: Medicaid Other | Source: Ambulatory Visit | Attending: Neurosurgery | Admitting: Neurosurgery

## 2014-07-03 ENCOUNTER — Other Ambulatory Visit: Payer: Self-pay | Admitting: Family Medicine

## 2014-07-03 ENCOUNTER — Other Ambulatory Visit: Payer: Self-pay | Admitting: Neurosurgery

## 2014-07-03 ENCOUNTER — Other Ambulatory Visit: Payer: Self-pay | Admitting: *Deleted

## 2014-07-03 DIAGNOSIS — M542 Cervicalgia: Secondary | ICD-10-CM

## 2014-07-03 DIAGNOSIS — E119 Type 2 diabetes mellitus without complications: Secondary | ICD-10-CM

## 2014-07-03 MED ORDER — GLUCOSE BLOOD VI STRP: ORAL_STRIP | Status: DC

## 2014-07-03 MED ORDER — ACCU-CHEK AVIVA PLUS W/DEVICE KIT: 1.0000 | PACK | Freq: Two times a day (BID) | Status: DC

## 2014-07-03 MED ORDER — ACCU-CHEK SOFTCLIX LANCET DEV MISC: Status: DC

## 2014-07-04 ENCOUNTER — Encounter: Payer: Self-pay | Admitting: Hematology and Oncology

## 2014-07-04 ENCOUNTER — Ambulatory Visit: Payer: Medicaid Other

## 2014-07-04 ENCOUNTER — Ambulatory Visit (HOSPITAL_BASED_OUTPATIENT_CLINIC_OR_DEPARTMENT_OTHER): Payer: Medicaid Other | Admitting: Hematology and Oncology

## 2014-07-04 ENCOUNTER — Telehealth: Payer: Self-pay | Admitting: Hematology and Oncology

## 2014-07-04 ENCOUNTER — Telehealth: Payer: Self-pay | Admitting: *Deleted

## 2014-07-04 VITALS — BP 101/60 | HR 106 | Temp 98.1°F | Resp 18 | Ht 66.0 in | Wt 163.5 lb

## 2014-07-04 DIAGNOSIS — C7949 Secondary malignant neoplasm of other parts of nervous system: Secondary | ICD-10-CM | POA: Diagnosis not present

## 2014-07-04 DIAGNOSIS — C3411 Malignant neoplasm of upper lobe, right bronchus or lung: Secondary | ICD-10-CM

## 2014-07-04 DIAGNOSIS — C7931 Secondary malignant neoplasm of brain: Secondary | ICD-10-CM | POA: Diagnosis not present

## 2014-07-04 DIAGNOSIS — Z933 Colostomy status: Secondary | ICD-10-CM | POA: Diagnosis not present

## 2014-07-04 DIAGNOSIS — C3490 Malignant neoplasm of unspecified part of unspecified bronchus or lung: Secondary | ICD-10-CM

## 2014-07-04 DIAGNOSIS — R569 Unspecified convulsions: Secondary | ICD-10-CM

## 2014-07-04 DIAGNOSIS — G8929 Other chronic pain: Secondary | ICD-10-CM | POA: Insufficient documentation

## 2014-07-04 DIAGNOSIS — S129XXA Fracture of neck, unspecified, initial encounter: Secondary | ICD-10-CM

## 2014-07-04 DIAGNOSIS — M545 Low back pain, unspecified: Secondary | ICD-10-CM | POA: Insufficient documentation

## 2014-07-04 DIAGNOSIS — S129XXS Fracture of neck, unspecified, sequela: Secondary | ICD-10-CM

## 2014-07-04 MED ORDER — MORPHINE SULFATE 15 MG PO TABS: 15.0000 mg | ORAL_TABLET | Freq: Four times a day (QID) | ORAL | Status: DC | PRN

## 2014-07-04 MED ORDER — MORPHINE SULFATE ER 15 MG PO TBCR: 15.0000 mg | EXTENDED_RELEASE_TABLET | Freq: Two times a day (BID) | ORAL | Status: DC

## 2014-07-04 NOTE — Assessment & Plan Note (Signed)
He has significant back pain and abdominal pain from recent surgery. We discussed extensively about chronic pain management. We discussed about the role of pain management in the oncology clinic. The prescribed pain regimen is for short term use only.  After the patient has completed the prescribed chemotherapy treatment, the pain medications will be slowly tapered off.  The patient agreed to be compliant with prescribed pain regimen and promised not to share the prescribed medications. Any lost medications or missed prescription will not be refilled sooner than anticipated time when the patient's prescription is expected to run out. We also discussed narcotics refill policy in the clinic.  The patient is educated to check the pill bottles in the middle of the week and ensure there is adequate supply to last through the weekend until next appointment or available business day.  The oncology service has a strict policy not to refill pain medications after business hours or the weekend.  If the patient is found to have violated the verbal agreement as stated no further pain medications will be prescribed in the future. I will start him on MS Contin and immediate release morphine as needed for breakthrough pain medicine

## 2014-07-04 NOTE — Telephone Encounter (Signed)
Drucie Ip RN erports she retrieved voicemail at [employer] that patient paid $22.00 for the long acting Morphine.  The prior authorization could be retroactive and reimbursed.  [place] to determine what pain medicine Medicaid will cover.  Nile Dear does not know.

## 2014-07-04 NOTE — Telephone Encounter (Signed)
Patient called upset that he was "not able to get the 12 hour morphine per Medicaid.  "Dr. Alvy Bimler needs to write a new prescription.  They said it will take three days to get this authorized.  I still have some of the Percocet left." Called Shelly with [facility] who report he needs prior authorization because medicaid doesn't cover it.  Dr. Alvy Bimler can write for something different for pain but Medicare doesn't cover MS Contin.  He took the prescription back because he was not happy he can't get this today." Instructed patient to take script inside to have filled.  Will notify Dr. Alvy Bimler and [place] of call.

## 2014-07-04 NOTE — Telephone Encounter (Signed)
Gave and printed appt sched and avs for pt for JUNE °

## 2014-07-04 NOTE — Assessment & Plan Note (Signed)
He has completed radiation treatment. I recommend reducing dexamethasone to 4 mg twice a day starting today. Next week, starting 07/11/2014, we will reduce it to 2 mg twice a day until further assessment

## 2014-07-04 NOTE — Telephone Encounter (Signed)
TC from pt and they will pay the $22 for his pain medicine today and not wait for the [facility]

## 2014-07-04 NOTE — Assessment & Plan Note (Signed)
He is managed conservatively with neuro spine clinic. I will defer to them for further follow-up.

## 2014-07-04 NOTE — Assessment & Plan Note (Signed)
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 He will continue Keppra for now.

## 2014-07-04 NOTE — Telephone Encounter (Signed)
Pt left several messages stating he needs prior auth on ms contin.  S/w Ebony in our managed care dept and she will send PA to Medicaid.   Called pt's fiance, Erasmo Downer to inform her we have started the P.A. Process.  She verbalized understanding and informs that pt went ahead and paid out of pocket for ms contin this time so he would not have to wait.

## 2014-07-04 NOTE — Assessment & Plan Note (Signed)
He is recovering from recent abdominal surgery and back fracture. The patient is not clinically fit to begin systemic treatment. I will bring him back and reassess in 2 weeks. In the meantime, continue supportive care.

## 2014-07-04 NOTE — Progress Notes (Signed)
Checked in new pt with no financial concerns.  Pt's insurance should pay at 100% so financial assistance may not be needed.

## 2014-07-04 NOTE — Assessment & Plan Note (Signed)
He has help from wound care and home health management for this. There is possible concern about infection. I recommend he consult with his surgeon for further management. Hopefully, by reducing the dose of dexamethasone, that would promote wound healing
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
## 2014-07-04 NOTE — Telephone Encounter (Signed)
Tell him to continue IR morphine for now What kind of long acting pain medications would Medicaid cover?

## 2014-07-10 ENCOUNTER — Telehealth: Payer: Self-pay | Admitting: Hematology and Oncology

## 2014-07-10 ENCOUNTER — Other Ambulatory Visit: Payer: Self-pay | Admitting: Hematology and Oncology

## 2014-07-10 MED ORDER — DEXAMETHASONE 1 MG PO TABS: 2.0000 mg | ORAL_TABLET | Freq: Two times a day (BID) | ORAL | Status: DC

## 2014-07-10 NOTE — Telephone Encounter (Signed)
Per Dr. Alvy Bimler taper Dexamethasone to 2 mg BID starting tomorrow and then again to 1 mg BID starting on Monday 5/30.Marland Kitchen   New Rx sent to [facility] for Dex 1 mg tablets.  Called pt and instructed on taking two tablets twice daily thru Sunday and then start one tablet twice daily on MOnday.   He verbalized understanding and states he currently down to 4 mg BID and will get new Rx and taper as directed.  Confirmed he will be scheduled to see Dr. Alvy Bimler on 6/2 at 2:30 pm..

## 2014-07-10 NOTE — Telephone Encounter (Signed)
s.w. pt and advised on JUne appt....pt ok a;nd aware

## 2014-07-18 ENCOUNTER — Ambulatory Visit (HOSPITAL_BASED_OUTPATIENT_CLINIC_OR_DEPARTMENT_OTHER): Payer: Medicaid Other | Admitting: Hematology and Oncology

## 2014-07-18 ENCOUNTER — Telehealth: Payer: Self-pay | Admitting: *Deleted

## 2014-07-18 ENCOUNTER — Other Ambulatory Visit: Payer: Self-pay | Admitting: Hematology and Oncology

## 2014-07-18 ENCOUNTER — Telehealth: Payer: Self-pay | Admitting: Hematology and Oncology

## 2014-07-18 ENCOUNTER — Other Ambulatory Visit: Payer: Self-pay | Admitting: Surgery

## 2014-07-18 VITALS — BP 116/70 | HR 100 | Temp 97.5°F | Resp 18 | Ht 66.0 in | Wt 162.2 lb

## 2014-07-18 DIAGNOSIS — G8929 Other chronic pain: Secondary | ICD-10-CM | POA: Diagnosis not present

## 2014-07-18 DIAGNOSIS — C7931 Secondary malignant neoplasm of brain: Secondary | ICD-10-CM | POA: Diagnosis not present

## 2014-07-18 DIAGNOSIS — C3491 Malignant neoplasm of unspecified part of right bronchus or lung: Secondary | ICD-10-CM

## 2014-07-18 DIAGNOSIS — C7949 Secondary malignant neoplasm of other parts of nervous system: Secondary | ICD-10-CM | POA: Diagnosis not present

## 2014-07-18 DIAGNOSIS — M545 Low back pain, unspecified: Secondary | ICD-10-CM

## 2014-07-18 DIAGNOSIS — T8131XS Disruption of external operation (surgical) wound, not elsewhere classified, sequela: Secondary | ICD-10-CM

## 2014-07-18 DIAGNOSIS — C3490 Malignant neoplasm of unspecified part of unspecified bronchus or lung: Secondary | ICD-10-CM

## 2014-07-18 DIAGNOSIS — C3411 Malignant neoplasm of upper lobe, right bronchus or lung: Secondary | ICD-10-CM

## 2014-07-18 MED ORDER — MORPHINE SULFATE 15 MG PO TABS: 15.0000 mg | ORAL_TABLET | Freq: Four times a day (QID) | ORAL | Status: DC | PRN

## 2014-07-18 NOTE — Telephone Encounter (Signed)
Left VM for Aaron Edelman, RN [facility] (ph# [phone]) gave verbal order ok to continue home care services per Dr. Alvy Bimler.
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
## 2014-07-18 NOTE — Telephone Encounter (Signed)
Per staff message and POF I have scheduled appts. Advised scheduler of appts. JMW  

## 2014-07-18 NOTE — Assessment & Plan Note (Signed)
The patient had wound dehiscence recently and it is slowly recovering. Apparently with the reduced dose dexamethasone it is improving. I plan to taper the dexamethasone down further to 0.5 mg twice a day.

## 2014-07-18 NOTE — Assessment & Plan Note (Signed)
He has significant back pain and abdominal pain from recent surgery. He felt that the OxyContin is not helping him. I recommend to stay on IR morphine for now. Refill his prescription pain medicine today.

## 2014-07-18 NOTE — Assessment & Plan Note (Signed)
Overall, he is improving. His abdominal wound is almost completely healed. I recommend staging PET CT scan and port placement in anticipation for possible chemotherapy starting on the week of 07/29/2014. I will see him back next week to review test results of PET CT scan. In the meantime we will continue supportive care.

## 2014-07-18 NOTE — Telephone Encounter (Signed)
VM message from [facility] nurse requesting recertification orders for home care: pt. Still receiving assitance with abdominal wound dressings, ostomy care, pain control and diabetic teaching. Please call verbal order to [phone] Aaron Edelman, RN)

## 2014-07-18 NOTE — Telephone Encounter (Signed)
per pof to sch pt appt-sent MW email to sch pt appt-gave tp copy of sch-pt to get completed copy on 6/7

## 2014-07-23 ENCOUNTER — Encounter: Payer: Self-pay | Admitting: Oncology

## 2014-07-23 ENCOUNTER — Encounter: Payer: Self-pay | Admitting: *Deleted

## 2014-07-23 ENCOUNTER — Other Ambulatory Visit: Payer: Medicaid Other

## 2014-07-24 ENCOUNTER — Other Ambulatory Visit: Payer: Self-pay | Admitting: Hematology and Oncology

## 2014-07-24 ENCOUNTER — Ambulatory Visit
Admit: 2014-07-24 | Discharge: 2014-07-24 | Disposition: A | Discharge status: Home / Self Care | Payer: No Typology Code available for payment source | Attending: Radiation Oncology | Admitting: Radiation Oncology

## 2014-07-24 ENCOUNTER — Telehealth: Payer: Self-pay | Admitting: *Deleted

## 2014-07-24 ENCOUNTER — Telehealth: Payer: Self-pay | Admitting: Oncology

## 2014-07-24 ENCOUNTER — Other Ambulatory Visit (HOSPITAL_BASED_OUTPATIENT_CLINIC_OR_DEPARTMENT_OTHER): Payer: Medicaid Other

## 2014-07-24 ENCOUNTER — Telehealth: Payer: Self-pay | Admitting: Hematology and Oncology

## 2014-07-24 ENCOUNTER — Encounter: Payer: Self-pay | Admitting: Radiation Oncology

## 2014-07-24 VITALS — BP 130/78 | HR 90 | Temp 97.9°F | Resp 16 | Ht 66.0 in | Wt 162.4 lb

## 2014-07-24 DIAGNOSIS — C7949 Secondary malignant neoplasm of other parts of nervous system: Principal | ICD-10-CM

## 2014-07-24 DIAGNOSIS — C3490 Malignant neoplasm of unspecified part of unspecified bronchus or lung: Secondary | ICD-10-CM

## 2014-07-24 DIAGNOSIS — C7931 Secondary malignant neoplasm of brain: Secondary | ICD-10-CM

## 2014-07-24 DIAGNOSIS — C3411 Malignant neoplasm of upper lobe, right bronchus or lung: Secondary | ICD-10-CM | POA: Diagnosis present

## 2014-07-24 DIAGNOSIS — C3491 Malignant neoplasm of unspecified part of right bronchus or lung: Secondary | ICD-10-CM

## 2014-07-24 LAB — COMPREHENSIVE METABOLIC PANEL (CC13)
ALBUMIN: 3.1 g/dL — AB (ref 3.5–5.0)
ALT: 72 U/L — ABNORMAL HIGH (ref 0–55)
ANION GAP: 9 meq/L (ref 3–11)
AST: 33 U/L (ref 5–34)
Alkaline Phosphatase: 165 U/L — ABNORMAL HIGH (ref 40–150)
BILIRUBIN TOTAL: 0.47 mg/dL (ref 0.20–1.20)
BUN: 11.8 mg/dL (ref 7.0–26.0)
CHLORIDE: 99 meq/L (ref 98–109)
CO2: 28 mEq/L (ref 22–29)
Calcium: 9.4 mg/dL (ref 8.4–10.4)
Creatinine: 0.6 mg/dL — ABNORMAL LOW (ref 0.7–1.3)
GLUCOSE: 106 mg/dL (ref 70–140)
POTASSIUM: 4 meq/L (ref 3.5–5.1)
Sodium: 136 mEq/L (ref 136–145)
Total Protein: 6.7 g/dL (ref 6.4–8.3)

## 2014-07-24 LAB — CBC WITH DIFFERENTIAL/PLATELET
BASO%: 0.1 % (ref 0.0–2.0)
BASOS ABS: 0 10*3/uL (ref 0.0–0.1)
EOS%: 0.2 % (ref 0.0–7.0)
Eosinophils Absolute: 0 10*3/uL (ref 0.0–0.5)
HCT: 38.3 % — ABNORMAL LOW (ref 38.4–49.9)
HEMOGLOBIN: 12.8 g/dL — AB (ref 13.0–17.1)
LYMPH%: 14.5 % (ref 14.0–49.0)
MCH: 31.3 pg (ref 27.2–33.4)
MCHC: 33.4 g/dL (ref 32.0–36.0)
MCV: 93.6 fL (ref 79.3–98.0)
MONO#: 0.7 10*3/uL (ref 0.1–0.9)
MONO%: 7.8 % (ref 0.0–14.0)
NEUT#: 7.3 10*3/uL — ABNORMAL HIGH (ref 1.5–6.5)
NEUT%: 77.4 % — ABNORMAL HIGH (ref 39.0–75.0)
PLATELETS: 235 10*3/uL (ref 140–400)
RBC: 4.09 10*6/uL — AB (ref 4.20–5.82)
RDW: 14.5 % (ref 11.0–14.6)
WBC: 9.5 10*3/uL (ref 4.0–10.3)
lymph#: 1.4 10*3/uL (ref 0.9–3.3)

## 2014-07-24 LAB — TECHNOLOGIST REVIEW
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
## 2014-07-24 NOTE — Telephone Encounter (Signed)
Left a message for Erasmo Downer regarding Lyriq's appointment today with Dr. Sondra Come.  Requested a return call.

## 2014-07-24 NOTE — Telephone Encounter (Signed)
lvm for pt regarding to cx 6.10 and moved to 6.14 per MD

## 2014-07-24 NOTE — Telephone Encounter (Signed)
Debbie at Dr. Pollie Friar office states pt was scheduled for [facility] a Cath on 7/59 and this conflicts w/ appt w/ Dr. Alvy Bimler same day.   Can we r/s Dr. Alvy Bimler appt?  Pt doesn't want to do both on same day.

## 2014-07-24 NOTE — Patient Instructions (Addendum)
[name]  07/24/2014   Your procedure is scheduled on: Tuesday 07/30/2014  Report to [facility] Main  Entrance and follow signs to               [place] at 0700 AM.  Call this number if you have problems the morning of surgery [phone]   Remember: ONLY 1 PERSON MAY GO WITH YOU TO SHORT STAY TO GET  READY MORNING OF [place].  Do not eat food or drink liquids :After Midnight.     Take these medicines the morning of surgery with A SIP OF WATER: Clonidine, Decadron, Keppra, Morphine                               You may not have any metal on your body including hair pins and              piercings  Do not wear jewelry, make-up, lotions, powders or perfumes, deodorant             Do not wear nail polish.  Do not shave  48 hours prior to surgery.              Men may shave face and neck.   Do not bring valuables to the hospital. [place].  Contacts, dentures or bridgework may not be worn into surgery.  Leave suitcase in the car. After surgery it may be brought to your room.     Patients discharged the day of surgery will not be allowed to drive home.  Name and phone number of your driver:  Special Instructions: N/A              Please read over the following fact sheets you were given: _____________________________________________________________________             [facility] - Preparing for Surgery Before surgery, you can play an important role.  Because skin is not sterile, your skin needs to be as free of germs as possible.  You can reduce the number of germs on your skin by washing with CHG (chlorahexidine gluconate) soap before surgery.  CHG is an antiseptic cleaner which kills germs and bonds with the skin to continue killing germs even after washing. Please DO NOT use if you have an allergy to CHG or antibacterial soaps.  If your skin becomes reddened/irritated stop using the CHG  and inform your nurse when you arrive at Short Stay. Do not shave (including legs and underarms) for at least 48 hours prior to the first CHG shower.  You may shave your face/neck. Please follow these instructions carefully:  1.  Shower with CHG Soap the night before surgery and the  morning of Surgery.  2.  If you choose to wash your hair, wash your hair first as usual with your  normal  shampoo.  3.  After you shampoo, rinse your hair and body thoroughly to remove the  shampoo.                           4.  Use CHG as you would any other liquid soap.  You can apply chg directly  to the skin and wash  Gently with a scrungie or clean washcloth.  5.  Apply the CHG Soap to your body ONLY FROM THE NECK DOWN.   Do not use on face/ open                           Wound or open sores. Avoid contact with eyes, ears mouth and genitals (private parts).                       Wash face,  Genitals (private parts) with your normal soap.             6.  Wash thoroughly, paying special attention to the area where your surgery  will be performed.  7.  Thoroughly rinse your body with warm water from the neck down.  8.  DO NOT shower/wash with your normal soap after using and rinsing off  the CHG Soap.                9.  Pat yourself dry with a clean towel.            10.  Wear clean pajamas.            11.  Place clean sheets on your bed the night of your first shower and do not  sleep with pets. Day of Surgery : Do not apply any lotions/deodorants the morning of surgery.  Please wear clean clothes to the hospital/surgery center.  FAILURE TO FOLLOW THESE INSTRUCTIONS MAY RESULT IN THE CANCELLATION OF YOUR SURGERY PATIENT SIGNATURE_________________________________  NURSE SIGNATURE__________________________________  ________________________________________________________________________   [name]  An incentive spirometer is a tool that can help keep your lungs clear and  active. This tool measures how well you are filling your lungs with each breath. Taking long deep breaths may help reverse or decrease the chance of developing breathing (pulmonary) problems (especially infection) following:  A long period of time when you are unable to move or be active. BEFORE THE PROCEDURE   If the spirometer includes an indicator to show your best effort, your nurse or respiratory therapist will set it to a desired goal.  If possible, sit up straight or lean slightly forward. Try not to slouch.  Hold the incentive spirometer in an upright position. INSTRUCTIONS FOR USE  1. Sit on the edge of your bed if possible, or sit up as far as you can in bed or on a chair. 2. Hold the incentive spirometer in an upright position. 3. Breathe out normally. 4. Place the mouthpiece in your mouth and seal your lips tightly around it. 5. Breathe in slowly and as deeply as possible, raising the piston or the ball toward the top of the column. 6. Hold your breath for 3-5 seconds or for as long as possible. Allow the piston or ball to fall to the bottom of the column. 7. Remove the mouthpiece from your mouth and breathe out normally. 8. Rest for a few seconds and repeat Steps 1 through 7 at least 10 times every 1-2 hours when you are awake. Take your time and take a few normal breaths between deep breaths. 9. The spirometer may include an indicator to show your best effort. Use the indicator as a goal to work toward during each repetition. 10. After each set of 10 deep breaths, practice coughing to be sure your lungs are clear. If you have an incision (the cut made at the time of surgery),  support your incision when coughing by placing a pillow or rolled up towels firmly against it. Once you are able to get out of bed, walk around indoors and cough well. You may stop using the incentive spirometer when instructed by your caregiver.  RISKS AND COMPLICATIONS  Take your time so you do not get  dizzy or light-headed.  If you are in pain, you may need to take or ask for pain medication before doing incentive spirometry. It is harder to take a deep breath if you are having pain. AFTER USE  Rest and breathe slowly and easily.  It can be helpful to keep track of a log of your progress. Your caregiver can provide you with a simple table to help with this. If you are using the spirometer at home, follow these instructions: [place] IF:   You are having difficultly using the spirometer.  You have trouble using the spirometer as often as instructed.  Your pain medication is not giving enough relief while using the spirometer.  You develop fever of 100.5 F (38.1 C) or higher. SEEK IMMEDIATE MEDICAL CARE IF:   You cough up bloody sputum that had not been present before.  You develop fever of 102 F (38.9 C) or greater.  You develop worsening pain at or near the incision site. MAKE SURE YOU:   Understand these instructions.  Will watch your condition.  Will get help right away if you are not doing well or get worse. Document Released: 06/14/2006 Document Revised: 04/26/2011 Document Reviewed: 08/15/2006 [facility] Patient Information 2014 [place], [place].   ________________________________________________________________________
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
## 2014-07-25 ENCOUNTER — Encounter (HOSPITAL_COMMUNITY)
Admission: RE | Admit: 2014-07-25 | Discharge: 2014-07-25 | Disposition: A | Discharge status: Home / Self Care | Payer: Medicaid Other | Source: Ambulatory Visit | Attending: Surgery | Admitting: Surgery

## 2014-07-25 ENCOUNTER — Ambulatory Visit (HOSPITAL_COMMUNITY): Payer: Medicaid Other

## 2014-07-25 ENCOUNTER — Telehealth: Payer: Self-pay | Admitting: *Deleted

## 2014-07-25 ENCOUNTER — Encounter (HOSPITAL_COMMUNITY): Payer: Self-pay | Admitting: *Deleted

## 2014-07-25 DIAGNOSIS — Z01818 Encounter for other preprocedural examination: Secondary | ICD-10-CM | POA: Insufficient documentation

## 2014-07-25 DIAGNOSIS — Z87891 Personal history of nicotine dependence: Secondary | ICD-10-CM | POA: Insufficient documentation

## 2014-07-25 DIAGNOSIS — C3491 Malignant neoplasm of unspecified part of right bronchus or lung: Secondary | ICD-10-CM | POA: Insufficient documentation

## 2014-07-25 LAB — SURGICAL PCR SCREEN
MRSA, PCR: NEGATIVE
STAPHYLOCOCCUS AUREUS: NEGATIVE

## 2014-07-25 NOTE — Telephone Encounter (Signed)
Reviewed with patient his schedule for 6/14-6/17 and 6/20. 6/14 0700 Dr Lucia Gaskins port placement, 1500 CT  6/15 1130 Dr Alvy Bimler, 1345 chemo, discussed chemo rest of week 6/20 MRI  PT verbalized understanding

## 2014-07-25 NOTE — Progress Notes (Signed)
Noted in EPIC, 06/07/2014-CXR 1 view and EKG, 07/24/2014- [facility]-CBC w/diff., CMET

## 2014-07-26 ENCOUNTER — Telehealth: Payer: Self-pay | Admitting: Hematology and Oncology

## 2014-07-26 ENCOUNTER — Ambulatory Visit: Payer: Self-pay | Admitting: Hematology and Oncology

## 2014-07-26 ENCOUNTER — Other Ambulatory Visit: Payer: Self-pay | Admitting: *Deleted

## 2014-07-26 ENCOUNTER — Telehealth: Payer: Self-pay | Admitting: *Deleted

## 2014-07-26 MED ORDER — DEXAMETHASONE 0.5 MG PO TABS: 0.5000 mg | ORAL_TABLET | Freq: Two times a day (BID) | ORAL | Status: DC

## 2014-07-26 MED ORDER — MORPHINE SULFATE 15 MG PO TABS: 15.0000 mg | ORAL_TABLET | Freq: Four times a day (QID) | ORAL | Status: DC | PRN

## 2014-07-26 NOTE — Telephone Encounter (Signed)
TC to Aaron Edelman, RN with [facility] to let her know prescriptions are ready for pick up.  Patty stated she will call Mr. Morandi with the above information.

## 2014-07-26 NOTE — Telephone Encounter (Signed)
VM message received at 2:29 pm from Aaron Edelman, RN with [facility]. Call back-left vm message on identified # for Patty to call back to clarify how much decadron pt is taking.   Requesting refill on Decadron (last filled on 07/10/14) and Morphine Sulfate (immediate release) 15 mg. Has 13 tablets left. Refill due 08/02/14

## 2014-07-26 NOTE — Telephone Encounter (Signed)
Left message to confirm appointment moved form 06/14 to 06/15.

## 2014-07-26 NOTE — Telephone Encounter (Signed)
Ready for pick up

## 2014-07-29 ENCOUNTER — Other Ambulatory Visit: Payer: Self-pay | Admitting: Surgery

## 2014-07-29 ENCOUNTER — Telehealth: Payer: Self-pay | Admitting: *Deleted
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
## 2014-07-29 NOTE — Telephone Encounter (Signed)
Pt's fiance called to clarify and confirm pt's schedule for this week.

## 2014-07-30 ENCOUNTER — Encounter (HOSPITAL_COMMUNITY)
Admission: RE | Disposition: A | Discharge status: Home / Self Care | Payer: Self-pay | Source: Ambulatory Visit | Attending: Surgery

## 2014-07-30 ENCOUNTER — Ambulatory Visit (HOSPITAL_COMMUNITY): Payer: Medicaid Other

## 2014-07-30 ENCOUNTER — Ambulatory Visit (HOSPITAL_COMMUNITY): Payer: Medicaid Other | Admitting: Certified Registered Nurse Anesthetist

## 2014-07-30 ENCOUNTER — Encounter (HOSPITAL_COMMUNITY): Payer: Self-pay | Admitting: *Deleted

## 2014-07-30 ENCOUNTER — Ambulatory Visit (HOSPITAL_COMMUNITY)
Admission: RE | Admit: 2014-07-30 | Discharge: 2014-07-30 | Disposition: A | Discharge status: Home / Self Care | Payer: Medicaid Other | Source: Ambulatory Visit | Attending: Hematology and Oncology | Admitting: Hematology and Oncology

## 2014-07-30 ENCOUNTER — Other Ambulatory Visit: Payer: Self-pay | Admitting: *Deleted

## 2014-07-30 ENCOUNTER — Telehealth: Payer: Self-pay | Admitting: Hematology and Oncology

## 2014-07-30 ENCOUNTER — Ambulatory Visit: Payer: Self-pay | Admitting: Hematology and Oncology

## 2014-07-30 ENCOUNTER — Ambulatory Visit (HOSPITAL_COMMUNITY)
Admission: RE | Admit: 2014-07-30 | Discharge: 2014-07-30 | Disposition: A | Discharge status: Home / Self Care | Payer: Medicaid Other | Source: Ambulatory Visit | Attending: Surgery | Admitting: Surgery

## 2014-07-30 DIAGNOSIS — Z95828 Presence of other vascular implants and grafts: Secondary | ICD-10-CM

## 2014-07-30 DIAGNOSIS — C7931 Secondary malignant neoplasm of brain: Secondary | ICD-10-CM | POA: Insufficient documentation

## 2014-07-30 DIAGNOSIS — Z933 Colostomy status: Secondary | ICD-10-CM | POA: Diagnosis not present

## 2014-07-30 DIAGNOSIS — C787 Secondary malignant neoplasm of liver and intrahepatic bile duct: Secondary | ICD-10-CM | POA: Diagnosis not present

## 2014-07-30 DIAGNOSIS — C7949 Secondary malignant neoplasm of other parts of nervous system: Principal | ICD-10-CM

## 2014-07-30 DIAGNOSIS — E119 Type 2 diabetes mellitus without complications: Secondary | ICD-10-CM | POA: Diagnosis not present

## 2014-07-30 DIAGNOSIS — C3491 Malignant neoplasm of unspecified part of right bronchus or lung: Secondary | ICD-10-CM

## 2014-07-30 DIAGNOSIS — Z79899 Other long term (current) drug therapy: Secondary | ICD-10-CM | POA: Diagnosis not present

## 2014-07-30 DIAGNOSIS — C7951 Secondary malignant neoplasm of bone: Secondary | ICD-10-CM | POA: Insufficient documentation

## 2014-07-30 DIAGNOSIS — R590 Localized enlarged lymph nodes: Secondary | ICD-10-CM | POA: Insufficient documentation

## 2014-07-30 DIAGNOSIS — I10 Essential (primary) hypertension: Secondary | ICD-10-CM | POA: Diagnosis not present

## 2014-07-30 DIAGNOSIS — Z4802 Encounter for removal of sutures: Secondary | ICD-10-CM | POA: Insufficient documentation

## 2014-07-30 DIAGNOSIS — G40301 Generalized idiopathic epilepsy and epileptic syndromes, not intractable, with status epilepticus: Secondary | ICD-10-CM | POA: Diagnosis not present

## 2014-07-30 DIAGNOSIS — K5720 Diverticulitis of large intestine with perforation and abscess without bleeding: Secondary | ICD-10-CM | POA: Insufficient documentation

## 2014-07-30 DIAGNOSIS — G8918 Other acute postprocedural pain: Secondary | ICD-10-CM | POA: Diagnosis not present

## 2014-07-30 HISTORY — PX: PORTACATH PLACEMENT: SHX2246

## 2014-07-30 LAB — GLUCOSE, CAPILLARY: GLUCOSE-CAPILLARY: 84 mg/dL (ref 65–99)

## 2014-07-30 SURGERY — INSERTION, TUNNELED CENTRAL VENOUS DEVICE, WITH PORT
Anesthesia: General

## 2014-07-30 MED ORDER — SODIUM CHLORIDE 0.9 % IR SOLN
Status: DC | PRN
Start: 2014-07-30 — End: 2014-07-30
  Administered 2014-07-30: 1

## 2014-07-30 MED ORDER — FENTANYL CITRATE (PF) 100 MCG/2ML IJ SOLN
INTRAMUSCULAR | Status: AC
  Filled 2014-07-30: qty 2

## 2014-07-30 MED ORDER — PROPOFOL 10 MG/ML IV BOLUS
INTRAVENOUS | Status: AC
  Filled 2014-07-30: qty 20

## 2014-07-30 MED ORDER — FENTANYL CITRATE (PF) 100 MCG/2ML IJ SOLN
25.0000 ug | INTRAMUSCULAR | Status: DC | PRN
  Administered 2014-07-30 (×3): 50 ug via INTRAVENOUS

## 2014-07-30 MED ORDER — SODIUM CHLORIDE 0.9 % IR SOLN
Freq: Once | Status: AC
  Administered 2014-07-30: 10:00:00
  Filled 2014-07-30: qty 1.2

## 2014-07-30 MED ORDER — BUPIVACAINE-EPINEPHRINE 0.5% -1:200000 IJ SOLN
INTRAMUSCULAR | Status: AC
  Filled 2014-07-30: qty 1

## 2014-07-30 MED ORDER — PROMETHAZINE HCL 25 MG/ML IJ SOLN: 6.2500 mg | INTRAMUSCULAR | Status: DC | PRN

## 2014-07-30 MED ORDER — LIDOCAINE HCL (CARDIAC) 20 MG/ML IV SOLN
INTRAVENOUS | Status: AC
  Filled 2014-07-30: qty 5

## 2014-07-30 MED ORDER — CHLORHEXIDINE GLUCONATE 4 % EX LIQD: 1.0000 "application " | Freq: Once | CUTANEOUS | Status: DC

## 2014-07-30 MED ORDER — HEPARIN SOD (PORK) LOCK FLUSH 100 UNIT/ML IV SOLN
INTRAVENOUS | Status: DC | PRN
  Administered 2014-07-30: 400 [IU]

## 2014-07-30 MED ORDER — ONDANSETRON HCL 4 MG/2ML IJ SOLN
INTRAMUSCULAR | Status: DC | PRN
  Administered 2014-07-30: 4 mg via INTRAVENOUS

## 2014-07-30 MED ORDER — MEPERIDINE HCL 50 MG/ML IJ SOLN: 6.2500 mg | INTRAMUSCULAR | Status: DC | PRN

## 2014-07-30 MED ORDER — BUPIVACAINE-EPINEPHRINE 0.5% -1:200000 IJ SOLN
INTRAMUSCULAR | Status: DC | PRN
  Administered 2014-07-30: 15 mL

## 2014-07-30 MED ORDER — PROPOFOL 10 MG/ML IV BOLUS
INTRAVENOUS | Status: DC | PRN
  Administered 2014-07-30: 200 mg via INTRAVENOUS

## 2014-07-30 MED ORDER — MIDAZOLAM HCL 5 MG/5ML IJ SOLN
INTRAMUSCULAR | Status: DC | PRN
  Administered 2014-07-30 (×2): 1 mg via INTRAVENOUS

## 2014-07-30 MED ORDER — LIDOCAINE HCL (CARDIAC) 20 MG/ML IV SOLN
INTRAVENOUS | Status: DC | PRN
  Administered 2014-07-30: 100 mg via INTRAVENOUS

## 2014-07-30 MED ORDER — LACTATED RINGERS IV SOLN
INTRAVENOUS | Status: DC
  Administered 2014-07-30: 12:00:00 via INTRAVENOUS

## 2014-07-30 MED ORDER — CEFAZOLIN SODIUM-DEXTROSE 2-3 GM-% IV SOLR
2.0000 g | INTRAVENOUS | Status: AC
  Administered 2014-07-30: 2 g via INTRAVENOUS

## 2014-07-30 MED ORDER — ONDANSETRON HCL 4 MG/2ML IJ SOLN
INTRAMUSCULAR | Status: AC
  Filled 2014-07-30: qty 2

## 2014-07-30 MED ORDER — CEFAZOLIN SODIUM-DEXTROSE 2-3 GM-% IV SOLR
INTRAVENOUS | Status: AC
  Filled 2014-07-30: qty 50

## 2014-07-30 MED ORDER — LACTATED RINGERS IV SOLN
INTRAVENOUS | Status: DC | PRN
  Administered 2014-07-30: 09:00:00 via INTRAVENOUS

## 2014-07-30 MED ORDER — IOHEXOL 300 MG/ML  SOLN
100.0000 mL | Freq: Once | INTRAMUSCULAR | Status: AC | PRN
  Administered 2014-07-30: 100 mL via INTRAVENOUS

## 2014-07-30 MED ORDER — IOHEXOL 300 MG/ML  SOLN
50.0000 mL | Freq: Once | INTRAMUSCULAR | Status: AC | PRN
  Administered 2014-07-30: 50 mL via ORAL

## 2014-07-30 MED ORDER — FENTANYL CITRATE (PF) 100 MCG/2ML IJ SOLN
INTRAMUSCULAR | Status: DC | PRN
  Administered 2014-07-30 (×4): 50 ug via INTRAVENOUS

## 2014-07-30 MED ORDER — MORPHINE SULFATE 10 MG/ML IJ SOLN
3.0000 mg | INTRAMUSCULAR | Status: DC | PRN
  Administered 2014-07-30: 3 mg via INTRAVENOUS
  Administered 2014-07-30: 2 mg via INTRAVENOUS
  Filled 2014-07-30: qty 1

## 2014-07-30 MED ORDER — HEPARIN SOD (PORK) LOCK FLUSH 100 UNIT/ML IV SOLN
INTRAVENOUS | Status: AC
  Filled 2014-07-30: qty 5

## 2014-07-30 MED ORDER — MIDAZOLAM HCL 2 MG/2ML IJ SOLN
INTRAMUSCULAR | Status: AC
  Filled 2014-07-30: qty 2

## 2014-07-30 SURGICAL SUPPLY — 31 items
BAG DECANTER FOR FLEXI CONT (MISCELLANEOUS) ×3 IMPLANT
BENZOIN TINCTURE PRP APPL 2/3 (GAUZE/BANDAGES/DRESSINGS) IMPLANT
BLADE HEX COATED 2.75 (ELECTRODE) ×3 IMPLANT
BLADE SURG 15 STRL LF DISP TIS (BLADE) ×1 IMPLANT
BLADE SURG 15 STRL SS (BLADE) ×3
CHLORAPREP W/TINT 26ML (MISCELLANEOUS) ×3 IMPLANT
CLOSURE WOUND 1/4X4 (GAUZE/BANDAGES/DRESSINGS)
DECANTER SPIKE VIAL GLASS SM (MISCELLANEOUS) ×3 IMPLANT
DRAPE C-ARM 42X120 X-RAY (DRAPES) ×3 IMPLANT
DRAPE LAPAROTOMY TRNSV 102X78 (DRAPE) ×3 IMPLANT
DRSG PAD ABDOMINAL 8X10 ST (GAUZE/BANDAGES/DRESSINGS) ×3 IMPLANT
ELECT REM PT RETURN 9FT ADLT (ELECTROSURGICAL) ×3
ELECTRODE REM PT RTRN 9FT ADLT (ELECTROSURGICAL) ×1 IMPLANT
GAUZE SPONGE 2X2 8PLY STRL LF (GAUZE/BANDAGES/DRESSINGS) ×1 IMPLANT
GAUZE SPONGE 4X4 12PLY STRL (GAUZE/BANDAGES/DRESSINGS) ×3 IMPLANT
GAUZE SPONGE 4X4 16PLY XRAY LF (GAUZE/BANDAGES/DRESSINGS) ×3 IMPLANT
GLOVE SURG SIGNA 7.5 PF LTX (GLOVE) ×3 IMPLANT
GOWN STRL REUS W/TWL XL LVL3 (GOWN DISPOSABLE) ×6 IMPLANT
KIT BASIN OR (CUSTOM PROCEDURE TRAY) ×3 IMPLANT
LIQUID BAND (GAUZE/BANDAGES/DRESSINGS) ×3 IMPLANT
NEEDLE HYPO 25X1 1.5 SAFETY (NEEDLE) ×3 IMPLANT
PACK BASIC VI WITH GOWN DISP (CUSTOM PROCEDURE TRAY) ×3 IMPLANT
PENCIL BUTTON HOLSTER BLD 10FT (ELECTRODE) ×3 IMPLANT
SPONGE GAUZE 2X2 STER 10/PKG (GAUZE/BANDAGES/DRESSINGS) ×2
SPONGE LAP 18X18 X RAY DECT (DISPOSABLE) ×3 IMPLANT
STRIP CLOSURE SKIN 1/4X4 (GAUZE/BANDAGES/DRESSINGS) IMPLANT
SUT MNCRL AB 4-0 PS2 18 (SUTURE) ×3 IMPLANT
SUT VIC AB 3-0 SH 18 (SUTURE) ×3 IMPLANT
SYR 20CC LL (SYRINGE) ×3 IMPLANT
SYRINGE 10CC LL (SYRINGE) ×3 IMPLANT
TOWEL OR 17X26 10 PK STRL BLUE (TOWEL DISPOSABLE) ×3 IMPLANT

## 2014-07-30 NOTE — Op Note (Addendum)
07/30/2014  10:15 AM  PATIENT:  [name], 44 y.o., male MRN: [id number] DOB: 23-Jan-1971  PREOP DIAGNOSIS:  metastatic lung cancer, anticipate chemotherapy  POSTOP DIAGNOSIS:   metastatic lung cancer, anticipate chemotherapy  PROCEDURE:   Procedure(s):  INSERTION PORT-A-CATH (Bard [place] Vue port), left subclavian vein, removal of retention sutures (photo of wound at the end of the note)  SURGEON:   Alphonsa Overall, M.D.  ANESTHESIA:   general  Anesthesiologist: Montez Hageman, MD CRNA: Maxwell Caul, CRNA  General  EBL:  minimal  ml  COUNTS CORRECT:  YES  INDICATIONS FOR PROCEDURE:  [name] is a 44 y.o. (DOB: [birth year]) white male whose primary care physician is [name] Morale, MD and comes for power port placement for the treatment of metastatic lung cancer.  Dr. Alvy [name] is his treating oncologist.   The indications and risks of the surgery were explained to the patient.  The risks include, but are not limited to, infection, bleeding, pneumothorax, nerve injury, and thrombosis of the vein.  OPERATIVE NOTE:  The patient was taken to Room #6 at [facility].  Anesthesia was provided by Anesthesiologist: Montez Hageman, MD CRNA: Maxwell Caul, CRNA.  At the beginning of the operation, the patient was given 2 gm Ancef, had a roll placed under her back, and had the upper chest/neck prepped with Chloroprep and draped.   A time out was held and the surgery checklist reviewed.   The patient was placed in Trendelenburg position.  The left subclavian vein was accessed with a 16 gauge needle and a guide wire threaded through the needle into the vein.  The position of the wire was checked with fluoroscopy.   I then developed a pocket in the upper inner aspect of the left chest for the port reservoir.  I used the [employer] for venous access.  The reservoir was sewn in place with a 3-0 Vicryl suture.  The reservoir had been flushed with dilute (10 units/cc) heparin.   I  then passed the silastic tubing from the reservoir incision to the subclavian stick site and used the 8 French introducer to pass it into the vein.  The tip of the silastic catheter was position at the junction of the SVC and the right atrium under fluoroscopy.  The silastic catheter was then attached to the port with the bayonet device.     The entire port and tubing were checked with fluoroscopy and then the port was flushed with 4 cc of concentrated heparin (100 units/cc).   The wounds were then closed with 3-0 vicryl subcutaneous sutures and the skin closed with a 5-0 Monocryl suture.  The skin was painted LiquiBand.   His wound looks much better than when I last saw it.  The retention sutures had served their purpose and I removed them.  He has some purulence around the sutures.    The patient was transferred to the recovery room in good condition.  The sponge and needle count were correct at the end of the case.  A CXR is ordered for port placement and pending at the time of this note.   I spoke to Dr. Alvy [name] about holding his chemotherapy for one week to allow the cellulitis around the retention sutures to resolve.   Abdominal wound after retention sutures removed.  Alphonsa Overall, MD, [facility] Pager: [phone] Office phone:  [phone]

## 2014-07-30 NOTE — Anesthesia Postprocedure Evaluation (Signed)
  Anesthesia Post-op Note  Patient: [name]  Procedure(s) Performed: Procedure(s) (LRB): INSERTION PORT-A-CATH WITH ULTRA SOUND (N/A)  Patient Location: PACU  Anesthesia Type: General  Level of Consciousness: awake and alert   Airway and Oxygen Therapy: Patient Spontanous Breathing  Post-op Pain: mild  Post-op Assessment: Post-op Vital signs reviewed, Patient's Cardiovascular Status Stable, Respiratory Function Stable, Patent Airway and No signs of Nausea or vomiting  Last Vitals:  Filed Vitals:   07/30/14 1125  BP: 107/68  Pulse: 85  Temp: 36.6 C  Resp: 16    Post-op Vital Signs: stable   Complications: No apparent anesthesia complications

## 2014-07-30 NOTE — Progress Notes (Signed)
Pt transferred to CT & will be d/c from CT.

## 2014-07-30 NOTE — Discharge Instructions (Signed)
[facility] - DISCHARGE INSTRUCTIONS TO PATIENT  Activity:  Lifting - No limit  Wound Care:   Leave power port site dry for 48 hours, then may shower       For the abdominal wound - continue local wound care.    Diet:  As tolerated  Follow up appointment:  Call Dr. Pollie Friar office [facility]) at [phone] for an appointment in 2 to 4 weeks.  Medications and dosages:  Resume your home medications.  You have a prescription for:  Has morphine already  Call Dr. Lucia Gaskins or his office  [phone]) if you have:  Temperature greater than 100.4,  Persistent nausea and vomiting,  Severe uncontrolled pain,  Redness, tenderness, or signs of infection (pain, swelling, redness, odor or green/yellow discharge around the site),  Difficulty breathing, headache or visual disturbances,  Any other questions or concerns you may have after discharge.  In an emergency, call 911 or go to an Emergency Department at a nearby hospital.

## 2014-07-30 NOTE — Anesthesia Preprocedure Evaluation (Signed)
Anesthesia Evaluation  Patient identified by MRN, date of birth, ID band Patient awake    Reviewed: Allergy & Precautions, H&P , NPO status , Patient's Chart, lab work & pertinent test results  Airway Mallampati: II  TM Distance: >3 FB Neck ROM: full    Dental no notable dental hx.    Pulmonary former smoker,  Lung cancer breath sounds clear to auscultation  Pulmonary exam normal       Cardiovascular Exercise Tolerance: Good hypertension, Normal cardiovascular examRhythm:regular Rate:Normal     Neuro/Psych Seizures -,  Brain mets treated with radiation recently.  negative psych ROS   GI/Hepatic negative GI ROS, Liver mets   Endo/Other  negative endocrine ROS  Renal/GU negative Renal ROS  negative genitourinary   Musculoskeletal   Abdominal   Peds  Hematology negative hematology ROS (+)   Anesthesia Other Findings   Reproductive/Obstetrics negative OB ROS                             Anesthesia Physical  Anesthesia Plan  ASA: III  Anesthesia Plan: General   Post-op Pain Management:    Induction: Intravenous  Airway Management Planned: LMA  Additional Equipment:   Intra-op Plan:   Post-operative Plan: Possible Post-op intubation/ventilation  Informed Consent: I have reviewed the patients History and Physical, chart, labs and discussed the procedure including the risks, benefits and alternatives for the proposed anesthesia with the patient or authorized representative who has indicated his/her understanding and acceptance.   Dental Advisory Given  Plan Discussed with: CRNA and Surgeon  Anesthesia Plan Comments:         Anesthesia Quick Evaluation

## 2014-07-30 NOTE — Interval H&P Note (Signed)
History and Physical Interval Note:  07/30/2014 8:45 AM  [name]  has presented today for surgery, with the diagnosis of metastatic lung cancer  The various methods of treatment have been discussed with the patient and family. I reviewed plans with the patient.  He is a little confused about the order of what is being done to him.  His fiancee, [name], is with him.    After consideration of risks, benefits and other options for treatment, the patient has consented to  Procedure(s): INSERTION PORT-A-CATH WITH ULTRA SOUND (N/A) as a surgical intervention .  The patient's history has been reviewed, patient examined, no change in status, stable for surgery.  I have reviewed the patient's chart and labs.  Questions were answered to the patient's satisfaction.     Stanley Lyness,Daimian H

## 2014-07-30 NOTE — Transfer of Care (Signed)
Immediate Anesthesia Transfer of Care Note  Patient: [name]  Procedure(s) Performed: Procedure(s): INSERTION PORT-A-CATH WITH ULTRA SOUND (N/A)  Patient Location: PACU  Anesthesia Type:General  Level of Consciousness:  sedated, patient cooperative and responds to stimulation  Airway & Oxygen Therapy:Patient Spontanous Breathing and Patient connected to face mask oxgen  Post-op Assessment:  Report given to PACU RN and Post -op Vital signs reviewed and stable  Post vital signs:  Reviewed and stable  Last Vitals:  Filed Vitals:   07/30/14 0715  BP: 109/67  Pulse: 98  Temp: 36.4 C  Resp: 18    Complications: No apparent anesthesia complications

## 2014-07-30 NOTE — Telephone Encounter (Signed)
Appointments adjusted per pof and patient will receive a new schedule upon hospital discharge

## 2014-07-30 NOTE — Progress Notes (Signed)
Called postop CXR report to Dr Lucia Gaskins

## 2014-07-30 NOTE — Anesthesia Procedure Notes (Signed)
Procedure Name: LMA Insertion Date/Time: 07/30/2014 9:20 AM Performed by: Maxwell Caul Pre-anesthesia Checklist: Patient identified, Emergency Drugs available, Suction available and Patient being monitored Patient Re-evaluated:Patient Re-evaluated prior to inductionOxygen Delivery Method: Circle system utilized Preoxygenation: Pre-oxygenation with 100% oxygen Intubation Type: IV induction LMA: LMA inserted LMA Size: 4.0 Number of attempts: 1 Tube secured with: Tape Dental Injury: Teeth and Oropharynx as per pre-operative assessment

## 2014-07-31 ENCOUNTER — Telehealth: Payer: Self-pay | Admitting: Hematology and Oncology

## 2014-07-31 ENCOUNTER — Ambulatory Visit (HOSPITAL_BASED_OUTPATIENT_CLINIC_OR_DEPARTMENT_OTHER): Payer: Medicaid Other | Admitting: Hematology and Oncology

## 2014-07-31 ENCOUNTER — Ambulatory Visit: Payer: Self-pay

## 2014-07-31 ENCOUNTER — Encounter (HOSPITAL_COMMUNITY): Payer: Self-pay | Admitting: Surgery

## 2014-07-31 VITALS — BP 109/65 | HR 98 | Temp 98.0°F | Resp 18 | Ht 65.0 in | Wt 163.3 lb

## 2014-07-31 DIAGNOSIS — C3411 Malignant neoplasm of upper lobe, right bronchus or lung: Secondary | ICD-10-CM | POA: Diagnosis not present

## 2014-07-31 DIAGNOSIS — C7949 Secondary malignant neoplasm of other parts of nervous system: Secondary | ICD-10-CM

## 2014-07-31 DIAGNOSIS — C7931 Secondary malignant neoplasm of brain: Secondary | ICD-10-CM

## 2014-07-31 DIAGNOSIS — G8929 Other chronic pain: Secondary | ICD-10-CM | POA: Diagnosis not present

## 2014-07-31 DIAGNOSIS — T8131XS Disruption of external operation (surgical) wound, not elsewhere classified, sequela: Secondary | ICD-10-CM

## 2014-07-31 DIAGNOSIS — M545 Low back pain, unspecified: Secondary | ICD-10-CM

## 2014-07-31 DIAGNOSIS — C3491 Malignant neoplasm of unspecified part of right bronchus or lung: Secondary | ICD-10-CM

## 2014-07-31 MED ORDER — ONDANSETRON HCL 8 MG PO TABS: 8.0000 mg | ORAL_TABLET | Freq: Three times a day (TID) | ORAL | Status: DC | PRN

## 2014-07-31 MED ORDER — PROCHLORPERAZINE MALEATE 10 MG PO TABS: 10.0000 mg | ORAL_TABLET | Freq: Four times a day (QID) | ORAL | Status: DC | PRN

## 2014-07-31 MED ORDER — MORPHINE SULFATE 15 MG PO TABS: 15.0000 mg | ORAL_TABLET | Freq: Four times a day (QID) | ORAL | Status: DC | PRN

## 2014-07-31 MED ORDER — LIDOCAINE-PRILOCAINE 2.5-2.5 % EX CREA: TOPICAL_CREAM | CUTANEOUS | Status: DC

## 2014-07-31 NOTE — Assessment & Plan Note (Signed)
The patient had wound dehiscence recently and it is slowly recovering.  Hopefully with the reduced dose dexamethasone it will help speed up the wound healing. I plan to taper the dexamethasone down further to 0.5 mg once a day and start next week.
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
## 2014-07-31 NOTE — Telephone Encounter (Signed)
Gave adn printed appt sched and avs for pt for June

## 2014-07-31 NOTE — Assessment & Plan Note (Signed)
He has significant back pain and abdominal pain from recent surgery. I recommend to stay on IR morphine for now. We discussed narcotic refill policy.

## 2014-07-31 NOTE — Assessment & Plan Note (Signed)
I reviewed the imaging study. Under the advice from his surgeon, I will delay his chemotherapy to the next week to allow more adequate wound healing. I recommend he continues dexamethasone taper and stop dexamethasone next week. The risk, benefit, side effects of carboplatin and etoposide were fully discussed with the patient and he is in agreement to proceed. I will see him back in 2 weeks for supportive care visit and toxicity review.

## 2014-08-01 ENCOUNTER — Ambulatory Visit: Payer: Self-pay

## 2014-08-02 ENCOUNTER — Encounter: Payer: Self-pay | Admitting: Family Medicine

## 2014-08-02 ENCOUNTER — Ambulatory Visit: Payer: Self-pay

## 2014-08-02 ENCOUNTER — Ambulatory Visit: Payer: Medicaid Other | Attending: Family Medicine | Admitting: Family Medicine

## 2014-08-02 VITALS — BP 120/80 | HR 88 | Temp 98.1°F | Resp 16 | Ht 65.0 in | Wt 164.0 lb

## 2014-08-02 DIAGNOSIS — S32029D Unspecified fracture of second lumbar vertebra, subsequent encounter for fracture with routine healing: Secondary | ICD-10-CM | POA: Diagnosis not present

## 2014-08-02 DIAGNOSIS — S22089D Unspecified fracture of T11-T12 vertebra, subsequent encounter for fracture with routine healing: Secondary | ICD-10-CM | POA: Diagnosis not present

## 2014-08-02 DIAGNOSIS — S32059D Unspecified fracture of fifth lumbar vertebra, subsequent encounter for fracture with routine healing: Secondary | ICD-10-CM | POA: Diagnosis not present

## 2014-08-02 DIAGNOSIS — T380X5A Adverse effect of glucocorticoids and synthetic analogues, initial encounter: Secondary | ICD-10-CM | POA: Diagnosis not present

## 2014-08-02 DIAGNOSIS — R739 Hyperglycemia, unspecified: Secondary | ICD-10-CM

## 2014-08-02 DIAGNOSIS — M4850XA Collapsed vertebra, not elsewhere classified, site unspecified, initial encounter for fracture: Secondary | ICD-10-CM | POA: Insufficient documentation

## 2014-08-02 DIAGNOSIS — IMO0001 Reserved for inherently not codable concepts without codable children: Secondary | ICD-10-CM

## 2014-08-02 DIAGNOSIS — Z933 Colostomy status: Secondary | ICD-10-CM | POA: Insufficient documentation

## 2014-08-02 DIAGNOSIS — E119 Type 2 diabetes mellitus without complications: Secondary | ICD-10-CM

## 2014-08-02 DIAGNOSIS — E559 Vitamin D deficiency, unspecified: Secondary | ICD-10-CM

## 2014-08-02 DIAGNOSIS — E099 Drug or chemical induced diabetes mellitus without complications: Secondary | ICD-10-CM | POA: Insufficient documentation

## 2014-08-02 DIAGNOSIS — S22009D Unspecified fracture of unspecified thoracic vertebra, subsequent encounter for fracture with routine healing: Secondary | ICD-10-CM | POA: Diagnosis not present

## 2014-08-02 DIAGNOSIS — M4850XG Collapsed vertebra, not elsewhere classified, site unspecified, subsequent encounter for fracture with delayed healing: Secondary | ICD-10-CM | POA: Diagnosis not present

## 2014-08-02 LAB — GLUCOSE, POCT (MANUAL RESULT ENTRY): POC Glucose: 127 mg/dl — AB (ref 70–99)

## 2014-08-02 MED ORDER — METFORMIN HCL 500 MG PO TABS: 500.0000 mg | ORAL_TABLET | Freq: Every day | ORAL | Status: DC

## 2014-08-02 NOTE — Assessment & Plan Note (Signed)
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

## 2014-08-02 NOTE — Progress Notes (Signed)
Establish Care with PCP No Hx tobacco- last cigarette May 13 2014
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
## 2014-08-02 NOTE — Assessment & Plan Note (Addendum)
A: Compression fracture, stable   P: Checking vit D level Pain control

## 2014-08-02 NOTE — Patient Instructions (Signed)
[name],  Thank you for coming in today. It was a pleasure meeting you. I look forward to being your primary doctor.   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 Compression fractures: Checking vit D level  You will be called with lab results  Dr. Adrian Blackwater

## 2014-08-03 LAB — VITAMIN D 25 HYDROXY (VIT D DEFICIENCY, FRACTURES): Vit D, 25-Hydroxy: 27 ng/mL — ABNORMAL LOW (ref 30–100)

## 2014-08-05 ENCOUNTER — Ambulatory Visit: Payer: Self-pay

## 2014-08-05 ENCOUNTER — Ambulatory Visit (HOSPITAL_COMMUNITY)
Admission: RE | Admit: 2014-08-05 | Discharge: 2014-08-05 | Disposition: A | Discharge status: Home / Self Care | Payer: Medicaid Other | Source: Ambulatory Visit | Attending: Hematology and Oncology | Admitting: Hematology and Oncology

## 2014-08-05 DIAGNOSIS — Z923 Personal history of irradiation: Secondary | ICD-10-CM | POA: Diagnosis not present

## 2014-08-05 DIAGNOSIS — C3490 Malignant neoplasm of unspecified part of unspecified bronchus or lung: Secondary | ICD-10-CM | POA: Diagnosis not present

## 2014-08-05 DIAGNOSIS — C7931 Secondary malignant neoplasm of brain: Secondary | ICD-10-CM | POA: Diagnosis not present

## 2014-08-05 DIAGNOSIS — C7949 Secondary malignant neoplasm of other parts of nervous system: Secondary | ICD-10-CM

## 2014-08-05 MED ORDER — GADOBENATE DIMEGLUMINE 529 MG/ML IV SOLN
15.0000 mL | Freq: Once | INTRAVENOUS | Status: AC | PRN
  Administered 2014-08-05: 15 mL via INTRAVENOUS

## 2014-08-07 ENCOUNTER — Ambulatory Visit (HOSPITAL_BASED_OUTPATIENT_CLINIC_OR_DEPARTMENT_OTHER): Payer: Medicaid Other

## 2014-08-07 ENCOUNTER — Encounter: Payer: Self-pay | Admitting: Skilled Nursing Facility1

## 2014-08-07 ENCOUNTER — Telehealth: Payer: Self-pay | Admitting: *Deleted

## 2014-08-07 VITALS — BP 96/60 | HR 85 | Temp 98.0°F | Resp 18

## 2014-08-07 DIAGNOSIS — Z5111 Encounter for antineoplastic chemotherapy: Secondary | ICD-10-CM | POA: Diagnosis present

## 2014-08-07 DIAGNOSIS — E559 Vitamin D deficiency, unspecified: Secondary | ICD-10-CM | POA: Insufficient documentation

## 2014-08-07 DIAGNOSIS — C3491 Malignant neoplasm of unspecified part of right bronchus or lung: Secondary | ICD-10-CM

## 2014-08-07 DIAGNOSIS — C7931 Secondary malignant neoplasm of brain: Secondary | ICD-10-CM | POA: Diagnosis not present

## 2014-08-07 DIAGNOSIS — C3411 Malignant neoplasm of upper lobe, right bronchus or lung: Secondary | ICD-10-CM

## 2014-08-07 DIAGNOSIS — C7949 Secondary malignant neoplasm of other parts of nervous system: Secondary | ICD-10-CM

## 2014-08-07 MED ORDER — SODIUM CHLORIDE 0.9 % IV SOLN
Freq: Once | INTRAVENOUS | Status: AC
  Administered 2014-08-07: 15:00:00 via INTRAVENOUS
  Filled 2014-08-07: qty 8

## 2014-08-07 MED ORDER — VITAMIN D3 50 MCG (2000 UT) PO TABS: 2000.0000 [IU] | ORAL_TABLET | Freq: Every day | ORAL | Status: AC

## 2014-08-07 MED ORDER — SODIUM CHLORIDE 0.9 % IJ SOLN
10.0000 mL | INTRAMUSCULAR | Status: DC | PRN
  Administered 2014-08-07: 10 mL
  Filled 2014-08-07: qty 10

## 2014-08-07 MED ORDER — SODIUM CHLORIDE 0.9 % IV SOLN
Freq: Once | INTRAVENOUS | Status: AC
  Administered 2014-08-07: 15:00:00 via INTRAVENOUS

## 2014-08-07 MED ORDER — HEPARIN SOD (PORK) LOCK FLUSH 100 UNIT/ML IV SOLN
500.0000 [IU] | Freq: Once | INTRAVENOUS | Status: AC | PRN
  Administered 2014-08-07: 500 [IU]
  Filled 2014-08-07: qty 5

## 2014-08-07 MED ORDER — SODIUM CHLORIDE 0.9 % IV SOLN
100.0000 mg/m2 | Freq: Once | INTRAVENOUS | Status: AC
  Administered 2014-08-07: 190 mg via INTRAVENOUS
  Filled 2014-08-07: qty 9.5

## 2014-08-07 MED ORDER — SODIUM CHLORIDE 0.9 % IV SOLN
725.5000 mg | Freq: Once | INTRAVENOUS | Status: AC
  Administered 2014-08-07: 730 mg via INTRAVENOUS
  Filled 2014-08-07: qty 73

## 2014-08-07 NOTE — Telephone Encounter (Signed)
LVM to return call.
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
## 2014-08-07 NOTE — Patient Instructions (Signed)
Etoposide, VP-16 injection  What is this medicine?  ETOPOSIDE, VP-16 (e toe POE side) is a chemotherapy drug. It is used to treat testicular cancer, lung cancer, and other cancers.  This medicine may be used for other purposes; ask your health care provider or pharmacist if you have questions.  COMMON BRAND NAME(S): Etopophos, Toposar, VePesid  What should I tell my health care provider before I take this medicine?  They need to know if you have any of these conditions:  -infection  -kidney disease  -low blood counts, like low white cell, platelet, or red cell counts  -an unusual or allergic reaction to etoposide, other chemotherapeutic agents, other medicines, foods, dyes, or preservatives  -pregnant or trying to get pregnant  -breast-feeding  How should I use this medicine?  This medicine is for infusion into a vein. It is administered in a hospital or clinic by a specially trained health care professional.  Talk to your pediatrician regarding the use of this medicine in children. Special care may be needed.  Overdosage: If you think you have taken too much of this medicine contact a poison control center or emergency room at once.  NOTE: This medicine is only for you. Do not share this medicine with others.  What if I miss a dose?  It is important not to miss your dose. Call your doctor or health care professional if you are unable to keep an appointment.  What may interact with this medicine?  -cyclosporine  -medicines to increase blood counts like filgrastim, pegfilgrastim, sargramostim  -vaccines  This list may not describe all possible interactions. Give your health care provider a list of all the medicines, herbs, non-prescription drugs, or dietary supplements you use. Also tell them if you smoke, drink alcohol, or use illegal drugs. Some items may interact with your medicine.  What should I watch for while using this medicine?  Visit your doctor for checks on your progress. This drug may make you feel  generally unwell. This is not uncommon, as chemotherapy can affect healthy cells as well as cancer cells. Report any side effects. Continue your course of treatment even though you feel ill unless your doctor tells you to stop.  In some cases, you may be given additional medicines to help with side effects. Follow all directions for their use.  Call your doctor or health care professional for advice if you get a fever, chills or sore throat, or other symptoms of a cold or flu. Do not treat yourself. This drug decreases your body's ability to fight infections. Try to avoid being around people who are sick.  This medicine may increase your risk to bruise or bleed. Call your doctor or health care professional if you notice any unusual bleeding.  Be careful brushing and flossing your teeth or using a toothpick because you may get an infection or bleed more easily. If you have any dental work done, tell your dentist you are receiving this medicine.  Avoid taking products that contain aspirin, acetaminophen, ibuprofen, naproxen, or ketoprofen unless instructed by your doctor. These medicines may hide a fever.  Do not become pregnant while taking this medicine. Women should inform their doctor if they wish to become pregnant or think they might be pregnant. There is a potential for serious side effects to an unborn child. Talk to your health care professional or pharmacist for more information. Do not breast-feed an infant while taking this medicine.  What side effects may I notice from receiving this   medicine?  Side effects that you should report to your doctor or health care professional as soon as possible:  -allergic reactions like skin rash, itching or hives, swelling of the face, lips, or tongue  -low blood counts - this medicine may decrease the number of white blood cells, red blood cells and platelets. You may be at increased risk for infections and bleeding.  -signs of infection - fever or chills, cough, sore  throat, pain or difficulty passing urine  -signs of decreased platelets or bleeding - bruising, pinpoint red spots on the skin, black, tarry stools, blood in the urine  -signs of decreased red blood cells - unusually weak or tired, fainting spells, lightheadedness  -breathing problems  -changes in vision  -mouth or throat sores or ulcers  -pain, redness, swelling or irritation at the injection site  -pain, tingling, numbness in the hands or feet  -redness, blistering, peeling or loosening of the skin, including inside the mouth  -seizures  -vomiting  Side effects that usually do not require medical attention (report to your doctor or health care professional if they continue or are bothersome):  -diarrhea  -hair loss  -loss of appetite  -nausea  -stomach pain  This list may not describe all possible side effects. Call your doctor for medical advice about side effects. You may report side effects to FDA at 1-800-FDA-1088.  Where should I keep my medicine?  This drug is given in a hospital or clinic and will not be stored at home.  NOTE: This sheet is a summary. It may not cover all possible information. If you have questions about this medicine, talk to your doctor, pharmacist, or health care provider.   2015, [employer]. (2007-06-05 17:24:12)  Carboplatin injection  What is this medicine?  CARBOPLATIN (KAR boe pla tin) is a chemotherapy drug. It targets fast dividing cells, like cancer cells, and causes these cells to die. This medicine is used to treat ovarian cancer and many other cancers.  This medicine may be used for other purposes; ask your health care provider or pharmacist if you have questions.  COMMON BRAND NAME(S): Paraplatin  What should I tell my health care provider before I take this medicine?  They need to know if you have any of these conditions:  -blood disorders  -hearing problems  -kidney disease  -recent or ongoing radiation therapy  -an unusual or allergic reaction to carboplatin,  cisplatin, other chemotherapy, other medicines, foods, dyes, or preservatives  -pregnant or trying to get pregnant  -breast-feeding  How should I use this medicine?  This drug is usually given as an infusion into a vein. It is administered in a hospital or clinic by a specially trained health care professional.  Talk to your pediatrician regarding the use of this medicine in children. Special care may be needed.  Overdosage: If you think you have taken too much of this medicine contact a poison control center or emergency room at once.  NOTE: This medicine is only for you. Do not share this medicine with others.  What if I miss a dose?  It is important not to miss a dose. Call your doctor or health care professional if you are unable to keep an appointment.  What may interact with this medicine?  -medicines for seizures  -medicines to increase blood counts like filgrastim, pegfilgrastim, sargramostim  -some antibiotics like amikacin, gentamicin, neomycin, streptomycin, tobramycin  -vaccines  Talk to your doctor or health care professional before taking any of these   medicines:  -acetaminophen  -aspirin  -ibuprofen  -ketoprofen  -naproxen  This list may not describe all possible interactions. Give your health care provider a list of all the medicines, herbs, non-prescription drugs, or dietary supplements you use. Also tell them if you smoke, drink alcohol, or use illegal drugs. Some items may interact with your medicine.  What should I watch for while using this medicine?  Your condition will be monitored carefully while you are receiving this medicine. You will need important blood work done while you are taking this medicine.  This drug may make you feel generally unwell. This is not uncommon, as chemotherapy can affect healthy cells as well as cancer cells. Report any side effects. Continue your course of treatment even though you feel ill unless your doctor tells you to stop.  In some cases, you may be given  additional medicines to help with side effects. Follow all directions for their use.  Call your doctor or health care professional for advice if you get a fever, chills or sore throat, or other symptoms of a cold or flu. Do not treat yourself. This drug decreases your body's ability to fight infections. Try to avoid being around people who are sick.  This medicine may increase your risk to bruise or bleed. Call your doctor or health care professional if you notice any unusual bleeding.  Be careful brushing and flossing your teeth or using a toothpick because you may get an infection or bleed more easily. If you have any dental work done, tell your dentist you are receiving this medicine.  Avoid taking products that contain aspirin, acetaminophen, ibuprofen, naproxen, or ketoprofen unless instructed by your doctor. These medicines may hide a fever.  Do not become pregnant while taking this medicine. Women should inform their doctor if they wish to become pregnant or think they might be pregnant. There is a potential for serious side effects to an unborn child. Talk to your health care professional or pharmacist for more information. Do not breast-feed an infant while taking this medicine.  What side effects may I notice from receiving this medicine?  Side effects that you should report to your doctor or health care professional as soon as possible:  -allergic reactions like skin rash, itching or hives, swelling of the face, lips, or tongue  -signs of infection - fever or chills, cough, sore throat, pain or difficulty passing urine  -signs of decreased platelets or bleeding - bruising, pinpoint red spots on the skin, black, tarry stools, nosebleeds  -signs of decreased red blood cells - unusually weak or tired, fainting spells, lightheadedness  -breathing problems  -changes in hearing  -changes in vision  -chest pain  -high blood pressure  -low blood counts - This drug may decrease the number of white blood cells, red  blood cells and platelets. You may be at increased risk for infections and bleeding.  -nausea and vomiting  -pain, swelling, redness or irritation at the injection site  -pain, tingling, numbness in the hands or feet  -problems with balance, talking, walking  -trouble passing urine or change in the amount of urine  Side effects that usually do not require medical attention (report to your doctor or health care professional if they continue or are bothersome):  -hair loss  -loss of appetite  -metallic taste in the mouth or changes in taste  This list may not describe all possible side effects. Call your doctor for medical advice about side effects. You may report side

## 2014-08-07 NOTE — Telephone Encounter (Signed)
-----   Message from Boykin Nearing, MD sent at 08/07/2014  5:05 PM EDT ----- Vit D insuff will treat

## 2014-08-07 NOTE — Addendum Note (Signed)
Addended by: Boykin Nearing on: 08/07/2014 05:06 PM   Modules accepted: Orders

## 2014-08-07 NOTE — Assessment & Plan Note (Signed)
Vit D insufficiency 2 K U d3 daily

## 2014-08-08 ENCOUNTER — Other Ambulatory Visit: Payer: Self-pay | Admitting: *Deleted

## 2014-08-08 ENCOUNTER — Ambulatory Visit (HOSPITAL_BASED_OUTPATIENT_CLINIC_OR_DEPARTMENT_OTHER): Payer: Medicaid Other

## 2014-08-08 VITALS — BP 108/68 | HR 83 | Temp 97.7°F | Resp 18

## 2014-08-08 DIAGNOSIS — C3411 Malignant neoplasm of upper lobe, right bronchus or lung: Secondary | ICD-10-CM | POA: Diagnosis not present

## 2014-08-08 DIAGNOSIS — C7931 Secondary malignant neoplasm of brain: Secondary | ICD-10-CM

## 2014-08-08 DIAGNOSIS — Z5111 Encounter for antineoplastic chemotherapy: Secondary | ICD-10-CM | POA: Diagnosis not present

## 2014-08-08 DIAGNOSIS — C7949 Secondary malignant neoplasm of other parts of nervous system: Secondary | ICD-10-CM | POA: Diagnosis not present

## 2014-08-08 DIAGNOSIS — C3491 Malignant neoplasm of unspecified part of right bronchus or lung: Secondary | ICD-10-CM

## 2014-08-08 MED ORDER — SODIUM CHLORIDE 0.9 % IV SOLN
Freq: Once | INTRAVENOUS | Status: AC
  Administered 2014-08-08: 16:00:00 via INTRAVENOUS

## 2014-08-08 MED ORDER — HEPARIN SOD (PORK) LOCK FLUSH 100 UNIT/ML IV SOLN
500.0000 [IU] | Freq: Once | INTRAVENOUS | Status: AC | PRN
  Administered 2014-08-08: 500 [IU]
  Filled 2014-08-08: qty 5

## 2014-08-08 MED ORDER — SODIUM CHLORIDE 0.9 % IV SOLN
100.0000 mg/m2 | Freq: Once | INTRAVENOUS | Status: AC
  Administered 2014-08-08: 190 mg via INTRAVENOUS
  Filled 2014-08-08: qty 9.5

## 2014-08-08 MED ORDER — PROCHLORPERAZINE MALEATE 10 MG PO TABS: 10.0000 mg | ORAL_TABLET | Freq: Once | ORAL | Status: DC

## 2014-08-08 MED ORDER — SODIUM CHLORIDE 0.9 % IJ SOLN
10.0000 mL | INTRAMUSCULAR | Status: DC | PRN
  Administered 2014-08-08: 10 mL
  Filled 2014-08-08: qty 10

## 2014-08-08 NOTE — Patient Instructions (Signed)
[facility] Discharge Instructions for Patients Receiving Chemotherapy  Today you received the following chemotherapy agents: etopside  To help prevent nausea and vomiting after your treatment, we encourage you to take your nausea medication as directed.    If you develop nausea and vomiting that is not controlled by your nausea medication, call the clinic.   BELOW ARE SYMPTOMS THAT SHOULD BE REPORTED IMMEDIATELY:  *FEVER GREATER THAN 100.5 F  *CHILLS WITH OR WITHOUT FEVER  NAUSEA AND VOMITING THAT IS NOT CONTROLLED WITH YOUR NAUSEA MEDICATION  *UNUSUAL SHORTNESS OF BREATH  *UNUSUAL BRUISING OR BLEEDING  TENDERNESS IN MOUTH AND THROAT WITH OR WITHOUT PRESENCE OF ULCERS  *URINARY PROBLEMS  *BOWEL PROBLEMS  UNUSUAL RASH Items with * indicate a potential emergency and should be followed up as soon as possible.  Feel free to call the clinic you have any questions or concerns. The clinic phone number is [phone].  Please show the [place] at check-in to the Emergency Department and triage nurse.

## 2014-08-08 NOTE — Progress Notes (Signed)
Pt states he does not want the compazine and would rather take his Zofran from home. Pt educated on compazine and he continues to take his Zofran from home and refuses compazine. Pharmacy aware.  Pt reports that he has one firm stool from colostomy today and one liquid stool from colostomy. Pt educated to monitor stool output and notify clinic if worsens.

## 2014-08-09 ENCOUNTER — Ambulatory Visit (HOSPITAL_BASED_OUTPATIENT_CLINIC_OR_DEPARTMENT_OTHER): Payer: Medicaid Other

## 2014-08-09 VITALS — BP 108/66 | HR 82 | Temp 97.7°F | Resp 18

## 2014-08-09 DIAGNOSIS — Z5111 Encounter for antineoplastic chemotherapy: Secondary | ICD-10-CM | POA: Diagnosis present

## 2014-08-09 DIAGNOSIS — C3411 Malignant neoplasm of upper lobe, right bronchus or lung: Secondary | ICD-10-CM | POA: Diagnosis not present

## 2014-08-09 DIAGNOSIS — C7931 Secondary malignant neoplasm of brain: Secondary | ICD-10-CM

## 2014-08-09 DIAGNOSIS — C3491 Malignant neoplasm of unspecified part of right bronchus or lung: Secondary | ICD-10-CM

## 2014-08-09 DIAGNOSIS — C7949 Secondary malignant neoplasm of other parts of nervous system: Secondary | ICD-10-CM | POA: Diagnosis not present

## 2014-08-09 MED ORDER — SODIUM CHLORIDE 0.9 % IV SOLN
Freq: Once | INTRAVENOUS | Status: AC
  Administered 2014-08-09: 15:00:00 via INTRAVENOUS

## 2014-08-09 MED ORDER — PROCHLORPERAZINE MALEATE 10 MG PO TABS: 10.0000 mg | ORAL_TABLET | Freq: Once | ORAL | Status: DC

## 2014-08-09 MED ORDER — SODIUM CHLORIDE 0.9 % IJ SOLN
10.0000 mL | INTRAMUSCULAR | Status: DC | PRN
  Administered 2014-08-09: 10 mL
  Filled 2014-08-09: qty 10

## 2014-08-09 MED ORDER — SODIUM CHLORIDE 0.9 % IV SOLN
100.0000 mg/m2 | Freq: Once | INTRAVENOUS | Status: AC
  Administered 2014-08-09: 190 mg via INTRAVENOUS
  Filled 2014-08-09: qty 9.5

## 2014-08-09 MED ORDER — HEPARIN SOD (PORK) LOCK FLUSH 100 UNIT/ML IV SOLN
500.0000 [IU] | Freq: Once | INTRAVENOUS | Status: AC | PRN
  Administered 2014-08-09: 500 [IU]
  Filled 2014-08-09: qty 5

## 2014-08-09 NOTE — Patient Instructions (Signed)
[facility] Discharge Instructions for Patients Receiving Chemotherapy  Today you received the following chemotherapy agents Etopside   To help prevent nausea and vomiting after your treatment, we encourage you to take your nausea medication as directed.   If you develop nausea and vomiting that is not controlled by your nausea medication, call the clinic.   BELOW ARE SYMPTOMS THAT SHOULD BE REPORTED IMMEDIATELY:  *FEVER GREATER THAN 100.5 F  *CHILLS WITH OR WITHOUT FEVER  NAUSEA AND VOMITING THAT IS NOT CONTROLLED WITH YOUR NAUSEA MEDICATION  *UNUSUAL SHORTNESS OF BREATH  *UNUSUAL BRUISING OR BLEEDING  TENDERNESS IN MOUTH AND THROAT WITH OR WITHOUT PRESENCE OF ULCERS  *URINARY PROBLEMS  *BOWEL PROBLEMS  UNUSUAL RASH Items with * indicate a potential emergency and should be followed up as soon as possible.  Feel free to call the clinic you have any questions or concerns. The clinic phone number is [phone].  Please show the [place] at check-in to the Emergency Department and triage nurse.

## 2014-08-09 NOTE — Progress Notes (Signed)
Pt refused compazine stating he took his zofran at approx 1400. Pt education performed and pt verbalizes understanding and continues to refuse compazine. Pt reports increased "tiredness" and no other complaints at this time. Pt educated to call clinic if fatigue worsens. Pt verbalizes understanding.

## 2014-08-12 ENCOUNTER — Ambulatory Visit (HOSPITAL_BASED_OUTPATIENT_CLINIC_OR_DEPARTMENT_OTHER): Payer: Medicaid Other

## 2014-08-12 VITALS — BP 112/67 | HR 86 | Temp 97.7°F | Resp 18

## 2014-08-12 DIAGNOSIS — Z5189 Encounter for other specified aftercare: Secondary | ICD-10-CM | POA: Diagnosis not present

## 2014-08-12 DIAGNOSIS — C7931 Secondary malignant neoplasm of brain: Secondary | ICD-10-CM | POA: Diagnosis not present

## 2014-08-12 DIAGNOSIS — C7949 Secondary malignant neoplasm of other parts of nervous system: Secondary | ICD-10-CM

## 2014-08-12 DIAGNOSIS — C3411 Malignant neoplasm of upper lobe, right bronchus or lung: Secondary | ICD-10-CM

## 2014-08-12 DIAGNOSIS — C3491 Malignant neoplasm of unspecified part of right bronchus or lung: Secondary | ICD-10-CM

## 2014-08-12 MED ORDER — PEGFILGRASTIM INJECTION 6 MG/0.6ML ~~LOC~~
6.0000 mg | PREFILLED_SYRINGE | Freq: Once | SUBCUTANEOUS | Status: AC
  Administered 2014-08-12: 6 mg via SUBCUTANEOUS
  Filled 2014-08-12: qty 0.6

## 2014-08-12 NOTE — Patient Instructions (Signed)
Pegfilgrastim injection What is this medicine? PEGFILGRASTIM (peg fil GRA stim) is a long-acting granulocyte colony-stimulating factor that stimulates the growth of neutrophils, a type of white blood cell important in the body's fight against infection. It is used to reduce the incidence of fever and infection in patients with certain types of cancer who are receiving chemotherapy that affects the bone marrow. This medicine may be used for other purposes; ask your health care provider or pharmacist if you have questions. COMMON BRAND NAME(S): Neulasta What should I tell my health care provider before I take this medicine? They need to know if you have any of these conditions: -latex allergy -ongoing radiation therapy -sickle cell disease -skin reactions to acrylic adhesives (On-Body Injector only) -an unusual or allergic reaction to pegfilgrastim, filgrastim, other medicines, foods, dyes, or preservatives -pregnant or trying to get pregnant -breast-feeding How should I use this medicine? This medicine is for injection under the skin. If you get this medicine at home, you will be taught how to prepare and give the pre-filled syringe or how to use the On-body Injector. Refer to the patient Instructions for Use for detailed instructions. Use exactly as directed. Take your medicine at regular intervals. Do not take your medicine more often than directed. It is important that you put your used needles and syringes in a special sharps container. Do not put them in a trash can. If you do not have a sharps container, call your pharmacist or healthcare provider to get one. Talk to your pediatrician regarding the use of this medicine in children. Special care may be needed. Overdosage: If you think you have taken too much of this medicine contact a poison control center or emergency room at once. NOTE: This medicine is only for you. Do not share this medicine with others. What if I miss a dose? It is  important not to miss your dose. Call your doctor or health care professional if you miss your dose. If you miss a dose due to an On-body Injector failure or leakage, a new dose should be administered as soon as possible using a single prefilled syringe for manual use. What may interact with this medicine? Interactions have not been studied. Give your health care provider a list of all the medicines, herbs, non-prescription drugs, or dietary supplements you use. Also tell them if you smoke, drink alcohol, or use illegal drugs. Some items may interact with your medicine. This list may not describe all possible interactions. Give your health care provider a list of all the medicines, herbs, non-prescription drugs, or dietary supplements you use. Also tell them if you smoke, drink alcohol, or use illegal drugs. Some items may interact with your medicine. What should I watch for while using this medicine? You may need blood work done while you are taking this medicine. If you are going to need a MRI, CT scan, or other procedure, tell your doctor that you are using this medicine (On-Body Injector only). What side effects may I notice from receiving this medicine? Side effects that you should report to your doctor or health care professional as soon as possible: -allergic reactions like skin rash, itching or hives, swelling of the face, lips, or tongue -dizziness -fever -pain, redness, or irritation at site where injected -pinpoint red spots on the skin -shortness of breath or breathing problems -stomach or side pain, or pain at the shoulder -swelling -tiredness -trouble passing urine Side effects that usually do not require medical attention (report to your doctor   or health care professional if they continue or are bothersome): -bone pain -muscle pain This list may not describe all possible side effects. Call your doctor for medical advice about side effects. You may report side effects to FDA at  1-800-FDA-1088. Where should I keep my medicine? Keep out of the reach of children. Store pre-filled syringes in a refrigerator between 2 and 8 degrees C (36 and 46 degrees F). Do not freeze. Keep in carton to protect from light. Throw away this medicine if it is left out of the refrigerator for more than 48 hours. Throw away any unused medicine after the expiration date. NOTE: This sheet is a summary. It may not cover all possible information. If you have questions about this medicine, talk to your doctor, pharmacist, or health care provider.  2015, [employer] Standard. (2013-05-03 16:14:05)  

## 2014-08-14 ENCOUNTER — Encounter: Payer: Self-pay | Admitting: Hematology and Oncology

## 2014-08-14 ENCOUNTER — Telehealth: Payer: Self-pay | Admitting: *Deleted

## 2014-08-14 ENCOUNTER — Ambulatory Visit (HOSPITAL_BASED_OUTPATIENT_CLINIC_OR_DEPARTMENT_OTHER): Payer: Medicaid Other | Admitting: Hematology and Oncology

## 2014-08-14 ENCOUNTER — Telehealth: Payer: Self-pay | Admitting: Hematology and Oncology

## 2014-08-14 VITALS — BP 112/68 | HR 94 | Temp 97.6°F | Resp 18 | Ht 65.0 in | Wt 156.6 lb

## 2014-08-14 DIAGNOSIS — C3411 Malignant neoplasm of upper lobe, right bronchus or lung: Secondary | ICD-10-CM | POA: Diagnosis not present

## 2014-08-14 DIAGNOSIS — T8131XS Disruption of external operation (surgical) wound, not elsewhere classified, sequela: Secondary | ICD-10-CM

## 2014-08-14 DIAGNOSIS — G8929 Other chronic pain: Secondary | ICD-10-CM

## 2014-08-14 DIAGNOSIS — C7931 Secondary malignant neoplasm of brain: Secondary | ICD-10-CM | POA: Diagnosis not present

## 2014-08-14 DIAGNOSIS — C7949 Secondary malignant neoplasm of other parts of nervous system: Secondary | ICD-10-CM

## 2014-08-14 DIAGNOSIS — C787 Secondary malignant neoplasm of liver and intrahepatic bile duct: Secondary | ICD-10-CM | POA: Diagnosis not present

## 2014-08-14 DIAGNOSIS — M545 Low back pain: Secondary | ICD-10-CM

## 2014-08-14 DIAGNOSIS — C3490 Malignant neoplasm of unspecified part of unspecified bronchus or lung: Secondary | ICD-10-CM

## 2014-08-14 MED ORDER — MORPHINE SULFATE 15 MG PO TABS: 15.0000 mg | ORAL_TABLET | Freq: Four times a day (QID) | ORAL | Status: DC | PRN

## 2014-08-14 NOTE — Assessment & Plan Note (Signed)
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 He has completed dexamethasone taper. I plan to repeat another MRI in a few months to follow

## 2014-08-14 NOTE — Assessment & Plan Note (Addendum)
He tolerated cycle one chemotherapy well without any side effects. He had very minor occasional hemoptysis when he coughs but they were self-limiting. The abdominal wound is improving. Continue supportive care. I will see him back in 2 weeks prior to cycle 2 of treatment

## 2014-08-14 NOTE — Assessment & Plan Note (Signed)
This is healing well. Continue aggressive wound care. He is off dexamethasone completely

## 2014-08-14 NOTE — Telephone Encounter (Signed)
Per staff message and POF I have scheduled appts. Advised scheduler of appts. JMW  

## 2014-08-14 NOTE — Telephone Encounter (Signed)
Pt confirmed labs/ov per 06/29 POF, gave pt AVS and Calendar... KJ, sent msg to add chemo and I will contact pt with times
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
## 2014-08-14 NOTE — Assessment & Plan Note (Signed)
He has significant back pain and abdominal pain from recent surgery. I recommend to stay on IR morphine for now. This appeared to be under control and he does not want any further surgery. We discussed narcotic refill policy.

## 2014-08-16 ENCOUNTER — Ambulatory Visit: Payer: Self-pay | Admitting: Hematology and Oncology

## 2014-08-28 ENCOUNTER — Ambulatory Visit: Payer: Medicaid Other

## 2014-08-28 ENCOUNTER — Ambulatory Visit (HOSPITAL_BASED_OUTPATIENT_CLINIC_OR_DEPARTMENT_OTHER): Payer: Medicaid Other | Admitting: Hematology and Oncology

## 2014-08-28 ENCOUNTER — Ambulatory Visit (HOSPITAL_BASED_OUTPATIENT_CLINIC_OR_DEPARTMENT_OTHER): Payer: Medicaid Other

## 2014-08-28 ENCOUNTER — Other Ambulatory Visit (HOSPITAL_BASED_OUTPATIENT_CLINIC_OR_DEPARTMENT_OTHER): Payer: Medicaid Other

## 2014-08-28 ENCOUNTER — Encounter: Payer: Self-pay | Admitting: Hematology and Oncology

## 2014-08-28 ENCOUNTER — Other Ambulatory Visit: Payer: Self-pay | Admitting: Hematology and Oncology

## 2014-08-28 ENCOUNTER — Telehealth: Payer: Self-pay | Admitting: Hematology and Oncology

## 2014-08-28 VITALS — BP 147/81 | HR 112 | Temp 98.7°F | Resp 18 | Ht 65.0 in | Wt 155.6 lb

## 2014-08-28 DIAGNOSIS — Z5111 Encounter for antineoplastic chemotherapy: Secondary | ICD-10-CM

## 2014-08-28 DIAGNOSIS — G8929 Other chronic pain: Secondary | ICD-10-CM | POA: Diagnosis not present

## 2014-08-28 DIAGNOSIS — D6481 Anemia due to antineoplastic chemotherapy: Secondary | ICD-10-CM

## 2014-08-28 DIAGNOSIS — C3491 Malignant neoplasm of unspecified part of right bronchus or lung: Secondary | ICD-10-CM

## 2014-08-28 DIAGNOSIS — C7931 Secondary malignant neoplasm of brain: Secondary | ICD-10-CM

## 2014-08-28 DIAGNOSIS — C7949 Secondary malignant neoplasm of other parts of nervous system: Secondary | ICD-10-CM

## 2014-08-28 DIAGNOSIS — D72829 Elevated white blood cell count, unspecified: Secondary | ICD-10-CM | POA: Insufficient documentation

## 2014-08-28 DIAGNOSIS — R042 Hemoptysis: Secondary | ICD-10-CM | POA: Diagnosis not present

## 2014-08-28 DIAGNOSIS — M545 Low back pain: Secondary | ICD-10-CM

## 2014-08-28 DIAGNOSIS — Z95828 Presence of other vascular implants and grafts: Secondary | ICD-10-CM

## 2014-08-28 DIAGNOSIS — D61810 Antineoplastic chemotherapy induced pancytopenia: Secondary | ICD-10-CM | POA: Insufficient documentation

## 2014-08-28 DIAGNOSIS — T8131XS Disruption of external operation (surgical) wound, not elsewhere classified, sequela: Secondary | ICD-10-CM

## 2014-08-28 DIAGNOSIS — T451X5A Adverse effect of antineoplastic and immunosuppressive drugs, initial encounter: Secondary | ICD-10-CM

## 2014-08-28 DIAGNOSIS — C3411 Malignant neoplasm of upper lobe, right bronchus or lung: Secondary | ICD-10-CM

## 2014-08-28 DIAGNOSIS — C3490 Malignant neoplasm of unspecified part of unspecified bronchus or lung: Secondary | ICD-10-CM

## 2014-08-28 LAB — CBC WITH DIFFERENTIAL/PLATELET
BASO%: 0.7 % (ref 0.0–2.0)
Basophils Absolute: 0.1 10*3/uL (ref 0.0–0.1)
EOS%: 0 % (ref 0.0–7.0)
Eosinophils Absolute: 0 10*3/uL (ref 0.0–0.5)
HEMATOCRIT: 31.5 % — AB (ref 38.4–49.9)
HGB: 10.5 g/dL — ABNORMAL LOW (ref 13.0–17.1)
LYMPH%: 5.8 % — ABNORMAL LOW (ref 14.0–49.0)
MCH: 31.1 pg (ref 27.2–33.4)
MCHC: 33.4 g/dL (ref 32.0–36.0)
MCV: 93.2 fL (ref 79.3–98.0)
MONO#: 3 10*3/uL — ABNORMAL HIGH (ref 0.1–0.9)
MONO%: 17.4 % — ABNORMAL HIGH (ref 0.0–14.0)
NEUT#: 13.1 10*3/uL — ABNORMAL HIGH (ref 1.5–6.5)
NEUT%: 76.1 % — ABNORMAL HIGH (ref 39.0–75.0)
Platelets: 392 10*3/uL (ref 140–400)
RBC: 3.38 10*6/uL — ABNORMAL LOW (ref 4.20–5.82)
RDW: 15.8 % — ABNORMAL HIGH (ref 11.0–14.6)
WBC: 17.2 10*3/uL — AB (ref 4.0–10.3)
lymph#: 1 10*3/uL (ref 0.9–3.3)

## 2014-08-28 LAB — COMPREHENSIVE METABOLIC PANEL (CC13)
ALT: 18 U/L (ref 0–55)
ANION GAP: 11 meq/L (ref 3–11)
AST: 23 U/L (ref 5–34)
Albumin: 3 g/dL — ABNORMAL LOW (ref 3.5–5.0)
Alkaline Phosphatase: 186 U/L — ABNORMAL HIGH (ref 40–150)
BUN: 10.6 mg/dL (ref 7.0–26.0)
CALCIUM: 9.5 mg/dL (ref 8.4–10.4)
CO2: 26 mEq/L (ref 22–29)
Chloride: 99 mEq/L (ref 98–109)
Creatinine: 0.7 mg/dL (ref 0.7–1.3)
GLUCOSE: 138 mg/dL (ref 70–140)
Potassium: 3.5 mEq/L (ref 3.5–5.1)
SODIUM: 136 meq/L (ref 136–145)
Total Bilirubin: 0.63 mg/dL (ref 0.20–1.20)
Total Protein: 6.7 g/dL (ref 6.4–8.3)

## 2014-08-28 MED ORDER — MORPHINE SULFATE 15 MG PO TABS: 15.0000 mg | ORAL_TABLET | Freq: Four times a day (QID) | ORAL | Status: DC | PRN

## 2014-08-28 MED ORDER — SODIUM CHLORIDE 0.9 % IV SOLN
Freq: Once | INTRAVENOUS | Status: AC
  Administered 2014-08-28: 13:00:00 via INTRAVENOUS
  Filled 2014-08-28: qty 8

## 2014-08-28 MED ORDER — SODIUM CHLORIDE 0.9 % IJ SOLN
10.0000 mL | INTRAMUSCULAR | Status: DC | PRN
  Administered 2014-08-28: 10 mL via INTRAVENOUS
  Filled 2014-08-28: qty 10

## 2014-08-28 MED ORDER — SODIUM CHLORIDE 0.9 % IJ SOLN
10.0000 mL | INTRAMUSCULAR | Status: DC | PRN
  Administered 2014-08-28: 10 mL
  Filled 2014-08-28: qty 10

## 2014-08-28 MED ORDER — SODIUM CHLORIDE 0.9 % IV SOLN
100.0000 mg/m2 | Freq: Once | INTRAVENOUS | Status: AC
  Administered 2014-08-28: 190 mg via INTRAVENOUS
  Filled 2014-08-28: qty 9.5

## 2014-08-28 MED ORDER — HEPARIN SOD (PORK) LOCK FLUSH 100 UNIT/ML IV SOLN
500.0000 [IU] | Freq: Once | INTRAVENOUS | Status: AC | PRN
  Administered 2014-08-28: 500 [IU]
  Filled 2014-08-28: qty 5

## 2014-08-28 MED ORDER — SODIUM CHLORIDE 0.9 % IV SOLN
Freq: Once | INTRAVENOUS | Status: AC
  Administered 2014-08-28: 13:00:00 via INTRAVENOUS

## 2014-08-28 MED ORDER — SODIUM CHLORIDE 0.9 % IV SOLN
730.0000 mg | Freq: Once | INTRAVENOUS | Status: AC
  Administered 2014-08-28: 730 mg via INTRAVENOUS
  Filled 2014-08-28: qty 73

## 2014-08-28 NOTE — Patient Instructions (Signed)
[facility] Discharge Instructions for Patients Receiving Chemotherapy  Today you received the following chemotherapy agents Etoposide/Carboplatin  To help prevent nausea and vomiting after your treatment, we encourage you to take your nausea medication     If you develop nausea and vomiting that is not controlled by your nausea medication, call the clinic.   BELOW ARE SYMPTOMS THAT SHOULD BE REPORTED IMMEDIATELY:  *FEVER GREATER THAN 100.5 F  *CHILLS WITH OR WITHOUT FEVER  NAUSEA AND VOMITING THAT IS NOT CONTROLLED WITH YOUR NAUSEA MEDICATION  *UNUSUAL SHORTNESS OF BREATH  *UNUSUAL BRUISING OR BLEEDING  TENDERNESS IN MOUTH AND THROAT WITH OR WITHOUT PRESENCE OF ULCERS  *URINARY PROBLEMS  *BOWEL PROBLEMS  UNUSUAL RASH Items with * indicate a potential emergency and should be followed up as soon as possible.  Feel free to call the clinic you have any questions or concerns. The clinic phone number is [phone].  Please show the [place] at check-in to the Emergency Department and triage nurse.

## 2014-08-28 NOTE — Assessment & Plan Note (Signed)
Clinically, he has no signs of infection. The leukocytosis could be related to recent Neulasta injection. I will observe for now.

## 2014-08-28 NOTE — Patient Instructions (Signed)

## 2014-08-28 NOTE — Assessment & Plan Note (Signed)
I am concerned about his recurrent hemoptysis. Certainly, positive response to treatment can be associated with necrotic tumor and that could cause hemoptysis as well. I will proceed with treatment today without delay but recommend urgent CT scan next week to assess response to treatment. He agreed to proceed

## 2014-08-28 NOTE — Assessment & Plan Note (Signed)
He has significant back pain and abdominal pain from recent surgery. I recommend to stay on IR morphine for now. This appeared to be under control and he does not want any further surgery. We discussed narcotic refill policy.

## 2014-08-28 NOTE — Telephone Encounter (Signed)
lvm fo rpt regarding to added MD visit...advised pt to com to sched to get barium

## 2014-08-28 NOTE — Assessment & Plan Note (Signed)
This is healing well. Continue aggressive wound care. He is off dexamethasone completely

## 2014-08-28 NOTE — Assessment & Plan Note (Signed)
This is likely due to recent treatment. The patient denies recent history of bleeding such as epistaxis, hematuria or hematochezia. He is asymptomatic from the anemia. I will observe for now.  He does not require transfusion now. I will continue the chemotherapy at current dose without dosage adjustment.  If the anemia gets progressive worse in the future, I might have to delay his treatment or adjust the chemotherapy dose.  
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
## 2014-08-29 ENCOUNTER — Ambulatory Visit (HOSPITAL_BASED_OUTPATIENT_CLINIC_OR_DEPARTMENT_OTHER): Payer: Medicaid Other

## 2014-08-29 VITALS — BP 133/85 | HR 97 | Temp 97.8°F

## 2014-08-29 DIAGNOSIS — C3411 Malignant neoplasm of upper lobe, right bronchus or lung: Secondary | ICD-10-CM | POA: Diagnosis not present

## 2014-08-29 DIAGNOSIS — C7949 Secondary malignant neoplasm of other parts of nervous system: Secondary | ICD-10-CM | POA: Diagnosis not present

## 2014-08-29 DIAGNOSIS — C7931 Secondary malignant neoplasm of brain: Secondary | ICD-10-CM

## 2014-08-29 DIAGNOSIS — C3490 Malignant neoplasm of unspecified part of unspecified bronchus or lung: Secondary | ICD-10-CM

## 2014-08-29 DIAGNOSIS — Z5111 Encounter for antineoplastic chemotherapy: Secondary | ICD-10-CM

## 2014-08-29 MED ORDER — PROCHLORPERAZINE MALEATE 10 MG PO TABS
ORAL_TABLET | ORAL | Status: AC
  Filled 2014-08-29: qty 1

## 2014-08-29 MED ORDER — SODIUM CHLORIDE 0.9 % IV SOLN
100.0000 mg/m2 | Freq: Once | INTRAVENOUS | Status: AC
  Administered 2014-08-29: 190 mg via INTRAVENOUS
  Filled 2014-08-29: qty 9.5

## 2014-08-29 MED ORDER — HEPARIN SOD (PORK) LOCK FLUSH 100 UNIT/ML IV SOLN
500.0000 [IU] | Freq: Once | INTRAVENOUS | Status: AC | PRN
  Administered 2014-08-29: 500 [IU]
  Filled 2014-08-29: qty 5

## 2014-08-29 MED ORDER — SODIUM CHLORIDE 0.9 % IJ SOLN
10.0000 mL | INTRAMUSCULAR | Status: DC | PRN
  Administered 2014-08-29: 10 mL
  Filled 2014-08-29: qty 10

## 2014-08-29 MED ORDER — PROCHLORPERAZINE MALEATE 10 MG PO TABS
10.0000 mg | ORAL_TABLET | Freq: Once | ORAL | Status: AC
  Administered 2014-08-29: 10 mg via ORAL

## 2014-08-29 MED ORDER — SODIUM CHLORIDE 0.9 % IV SOLN
Freq: Once | INTRAVENOUS | Status: AC
  Administered 2014-08-29: 10:00:00 via INTRAVENOUS

## 2014-08-29 NOTE — Patient Instructions (Signed)
[facility] Discharge Instructions for Patients Receiving Chemotherapy  Today you received the following chemotherapy agents:  Etoposide  To help prevent nausea and vomiting after your treatment, we encourage you to take your nausea medication.   If you develop nausea and vomiting that is not controlled by your nausea medication, call the clinic.   BELOW ARE SYMPTOMS THAT SHOULD BE REPORTED IMMEDIATELY:  *FEVER GREATER THAN 100.5 F  *CHILLS WITH OR WITHOUT FEVER  NAUSEA AND VOMITING THAT IS NOT CONTROLLED WITH YOUR NAUSEA MEDICATION  *UNUSUAL SHORTNESS OF BREATH  *UNUSUAL BRUISING OR BLEEDING  TENDERNESS IN MOUTH AND THROAT WITH OR WITHOUT PRESENCE OF ULCERS  *URINARY PROBLEMS  *BOWEL PROBLEMS  UNUSUAL RASH Items with * indicate a potential emergency and should be followed up as soon as possible.  Feel free to call the clinic you have any questions or concerns. The clinic phone number is [phone].  Please show the CHEMO ALERT CARD at check-in to the Emergency Department and triage nurse.   

## 2014-08-30 ENCOUNTER — Other Ambulatory Visit: Payer: Self-pay | Admitting: Hematology and Oncology

## 2014-08-30 ENCOUNTER — Telehealth: Payer: Self-pay | Admitting: *Deleted

## 2014-08-30 ENCOUNTER — Ambulatory Visit (HOSPITAL_BASED_OUTPATIENT_CLINIC_OR_DEPARTMENT_OTHER): Payer: Medicaid Other

## 2014-08-30 VITALS — BP 132/75 | HR 90 | Temp 98.1°F | Resp 18

## 2014-08-30 DIAGNOSIS — C7931 Secondary malignant neoplasm of brain: Secondary | ICD-10-CM

## 2014-08-30 DIAGNOSIS — C3411 Malignant neoplasm of upper lobe, right bronchus or lung: Secondary | ICD-10-CM | POA: Diagnosis not present

## 2014-08-30 DIAGNOSIS — Z5111 Encounter for antineoplastic chemotherapy: Secondary | ICD-10-CM | POA: Diagnosis not present

## 2014-08-30 DIAGNOSIS — C3490 Malignant neoplasm of unspecified part of unspecified bronchus or lung: Secondary | ICD-10-CM

## 2014-08-30 DIAGNOSIS — C7949 Secondary malignant neoplasm of other parts of nervous system: Secondary | ICD-10-CM | POA: Diagnosis not present

## 2014-08-30 MED ORDER — SODIUM CHLORIDE 0.9 % IV SOLN
100.0000 mg/m2 | Freq: Once | INTRAVENOUS | Status: AC
  Administered 2014-08-30: 190 mg via INTRAVENOUS
  Filled 2014-08-30: qty 9.5

## 2014-08-30 MED ORDER — SODIUM CHLORIDE 0.9 % IJ SOLN
10.0000 mL | INTRAMUSCULAR | Status: DC | PRN
  Administered 2014-08-30: 10 mL
  Filled 2014-08-30: qty 10

## 2014-08-30 MED ORDER — PROCHLORPERAZINE MALEATE 10 MG PO TABS
ORAL_TABLET | ORAL | Status: AC
  Filled 2014-08-30: qty 1

## 2014-08-30 MED ORDER — PROCHLORPERAZINE MALEATE 10 MG PO TABS
10.0000 mg | ORAL_TABLET | Freq: Once | ORAL | Status: AC
  Administered 2014-08-30: 10 mg via ORAL

## 2014-08-30 MED ORDER — HEPARIN SOD (PORK) LOCK FLUSH 100 UNIT/ML IV SOLN
500.0000 [IU] | Freq: Once | INTRAVENOUS | Status: AC | PRN
  Administered 2014-08-30: 500 [IU]
  Filled 2014-08-30: qty 5

## 2014-08-30 MED ORDER — LEVETIRACETAM 500 MG PO TABS: 500.0000 mg | ORAL_TABLET | Freq: Two times a day (BID) | ORAL | Status: DC

## 2014-08-30 MED ORDER — SODIUM CHLORIDE 0.9 % IV SOLN
Freq: Once | INTRAVENOUS | Status: AC
  Administered 2014-08-30: 09:00:00 via INTRAVENOUS

## 2014-08-30 NOTE — Patient Instructions (Signed)
[name] Discharge Instructions for Patients Receiving Chemotherapy  Today you received the following chemotherapy agents Etoposide.  To help prevent nausea and vomiting after your treatment, we encourage you to take your nausea medication Compazine 10 mg every 6 hours as needed.  If you develop nausea and vomiting that is not controlled by your nausea medication, call the clinic.   BELOW ARE SYMPTOMS THAT SHOULD BE REPORTED IMMEDIATELY:  *FEVER GREATER THAN 100.5 F  *CHILLS WITH OR WITHOUT FEVER  NAUSEA AND VOMITING THAT IS NOT CONTROLLED WITH YOUR NAUSEA MEDICATION  *UNUSUAL SHORTNESS OF BREATH  *UNUSUAL BRUISING OR BLEEDING  TENDERNESS IN MOUTH AND THROAT WITH OR WITHOUT PRESENCE OF ULCERS  *URINARY PROBLEMS  *BOWEL PROBLEMS  UNUSUAL RASH Items with * indicate a potential emergency and should be followed up as soon as possible.  Feel free to call the clinic you have any questions or concerns. The clinic phone number is [phone].  Please show the [name] at check-in to the Emergency Department and triage nurse.

## 2014-08-30 NOTE — Telephone Encounter (Signed)
Pt requests refill on Keppra sent to [facility].

## 2014-08-30 NOTE — Telephone Encounter (Signed)
Yes with 6 refills

## 2014-09-02 ENCOUNTER — Ambulatory Visit (HOSPITAL_BASED_OUTPATIENT_CLINIC_OR_DEPARTMENT_OTHER): Payer: Medicaid Other

## 2014-09-02 ENCOUNTER — Encounter (HOSPITAL_COMMUNITY): Payer: Self-pay

## 2014-09-02 ENCOUNTER — Ambulatory Visit (HOSPITAL_COMMUNITY)
Admission: RE | Admit: 2014-09-02 | Discharge: 2014-09-02 | Disposition: A | Discharge status: Home / Self Care | Payer: Medicaid Other | Source: Ambulatory Visit | Attending: Hematology and Oncology | Admitting: Hematology and Oncology

## 2014-09-02 VITALS — BP 134/82 | HR 94 | Temp 98.3°F

## 2014-09-02 DIAGNOSIS — Z5189 Encounter for other specified aftercare: Secondary | ICD-10-CM

## 2014-09-02 DIAGNOSIS — I2510 Atherosclerotic heart disease of native coronary artery without angina pectoris: Secondary | ICD-10-CM | POA: Diagnosis not present

## 2014-09-02 DIAGNOSIS — C7951 Secondary malignant neoplasm of bone: Secondary | ICD-10-CM | POA: Diagnosis not present

## 2014-09-02 DIAGNOSIS — C3490 Malignant neoplasm of unspecified part of unspecified bronchus or lung: Secondary | ICD-10-CM

## 2014-09-02 DIAGNOSIS — C3411 Malignant neoplasm of upper lobe, right bronchus or lung: Secondary | ICD-10-CM | POA: Diagnosis not present

## 2014-09-02 DIAGNOSIS — C7949 Secondary malignant neoplasm of other parts of nervous system: Secondary | ICD-10-CM

## 2014-09-02 DIAGNOSIS — C787 Secondary malignant neoplasm of liver and intrahepatic bile duct: Secondary | ICD-10-CM | POA: Insufficient documentation

## 2014-09-02 DIAGNOSIS — C7931 Secondary malignant neoplasm of brain: Secondary | ICD-10-CM | POA: Insufficient documentation

## 2014-09-02 MED ORDER — PEGFILGRASTIM INJECTION 6 MG/0.6ML ~~LOC~~
6.0000 mg | PREFILLED_SYRINGE | Freq: Once | SUBCUTANEOUS | Status: AC
  Administered 2014-09-02: 6 mg via SUBCUTANEOUS
  Filled 2014-09-02: qty 0.6

## 2014-09-02 MED ORDER — IOHEXOL 300 MG/ML  SOLN
100.0000 mL | Freq: Once | INTRAMUSCULAR | Status: AC | PRN
  Administered 2014-09-02: 100 mL via INTRAVENOUS

## 2014-09-04 ENCOUNTER — Encounter: Payer: Self-pay | Admitting: Hematology and Oncology

## 2014-09-04 ENCOUNTER — Telehealth: Payer: Self-pay | Admitting: Hematology and Oncology

## 2014-09-04 ENCOUNTER — Ambulatory Visit (HOSPITAL_BASED_OUTPATIENT_CLINIC_OR_DEPARTMENT_OTHER): Payer: Medicaid Other | Admitting: Hematology and Oncology

## 2014-09-04 VITALS — BP 110/72 | HR 94 | Temp 98.1°F | Resp 18 | Ht 65.0 in | Wt 153.8 lb

## 2014-09-04 DIAGNOSIS — T8131XS Disruption of external operation (surgical) wound, not elsewhere classified, sequela: Secondary | ICD-10-CM

## 2014-09-04 DIAGNOSIS — R05 Cough: Secondary | ICD-10-CM | POA: Diagnosis not present

## 2014-09-04 DIAGNOSIS — C3411 Malignant neoplasm of upper lobe, right bronchus or lung: Secondary | ICD-10-CM

## 2014-09-04 DIAGNOSIS — C7931 Secondary malignant neoplasm of brain: Secondary | ICD-10-CM | POA: Diagnosis not present

## 2014-09-04 DIAGNOSIS — C7949 Secondary malignant neoplasm of other parts of nervous system: Secondary | ICD-10-CM | POA: Diagnosis not present

## 2014-09-04 DIAGNOSIS — R042 Hemoptysis: Secondary | ICD-10-CM

## 2014-09-04 DIAGNOSIS — C3490 Malignant neoplasm of unspecified part of unspecified bronchus or lung: Secondary | ICD-10-CM

## 2014-09-04 MED ORDER — MORPHINE SULFATE 15 MG PO TABS: 15.0000 mg | ORAL_TABLET | Freq: Four times a day (QID) | ORAL | Status: DC | PRN

## 2014-09-04 NOTE — Telephone Encounter (Signed)
Gave and printed appt sched and avs for pt for Aug °
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
## 2014-09-04 NOTE — Assessment & Plan Note (Signed)
The hemoptysis is due to necrotic tumor. I reassured the patient that he is responding well to treatment. We will continue up to maximum 6 cycles of treatment in the future.

## 2014-09-04 NOTE — Assessment & Plan Note (Signed)
This is healing well. Continue aggressive wound care. He is off dexamethasone completely

## 2014-09-04 NOTE — Assessment & Plan Note (Signed)
The recurrent hemoptysis is due to necrotic tumor. The amount he produces is scant. I reassured the patient that indicated positive response to treatment and we should not hold treatment

## 2014-09-05 ENCOUNTER — Telehealth: Payer: Self-pay | Admitting: *Deleted

## 2014-09-05 NOTE — Telephone Encounter (Signed)
Per staff message and POF I have scheduled appts. Advised scheduler of appts. JMW  

## 2014-09-18 ENCOUNTER — Encounter: Payer: Self-pay | Admitting: Hematology and Oncology

## 2014-09-18 ENCOUNTER — Ambulatory Visit (HOSPITAL_BASED_OUTPATIENT_CLINIC_OR_DEPARTMENT_OTHER): Payer: Medicaid Other

## 2014-09-18 ENCOUNTER — Telehealth: Payer: Self-pay | Admitting: Hematology and Oncology

## 2014-09-18 ENCOUNTER — Telehealth: Payer: Self-pay | Admitting: *Deleted

## 2014-09-18 ENCOUNTER — Other Ambulatory Visit: Payer: Self-pay | Admitting: *Deleted

## 2014-09-18 ENCOUNTER — Ambulatory Visit: Payer: Medicaid Other

## 2014-09-18 ENCOUNTER — Other Ambulatory Visit (HOSPITAL_BASED_OUTPATIENT_CLINIC_OR_DEPARTMENT_OTHER): Payer: Medicaid Other

## 2014-09-18 ENCOUNTER — Ambulatory Visit (HOSPITAL_BASED_OUTPATIENT_CLINIC_OR_DEPARTMENT_OTHER): Payer: Medicaid Other | Admitting: Hematology and Oncology

## 2014-09-18 VITALS — BP 124/94 | HR 116 | Temp 97.7°F | Resp 18 | Ht 65.0 in | Wt 153.1 lb

## 2014-09-18 DIAGNOSIS — C3490 Malignant neoplasm of unspecified part of unspecified bronchus or lung: Secondary | ICD-10-CM

## 2014-09-18 DIAGNOSIS — C3411 Malignant neoplasm of upper lobe, right bronchus or lung: Secondary | ICD-10-CM

## 2014-09-18 DIAGNOSIS — G8929 Other chronic pain: Secondary | ICD-10-CM | POA: Diagnosis not present

## 2014-09-18 DIAGNOSIS — D6481 Anemia due to antineoplastic chemotherapy: Secondary | ICD-10-CM | POA: Diagnosis not present

## 2014-09-18 DIAGNOSIS — T50905A Adverse effect of unspecified drugs, medicaments and biological substances, initial encounter: Secondary | ICD-10-CM

## 2014-09-18 DIAGNOSIS — Z95828 Presence of other vascular implants and grafts: Secondary | ICD-10-CM

## 2014-09-18 DIAGNOSIS — D61818 Other pancytopenia: Secondary | ICD-10-CM

## 2014-09-18 DIAGNOSIS — R112 Nausea with vomiting, unspecified: Secondary | ICD-10-CM | POA: Insufficient documentation

## 2014-09-18 DIAGNOSIS — R109 Unspecified abdominal pain: Secondary | ICD-10-CM

## 2014-09-18 DIAGNOSIS — T451X5A Adverse effect of antineoplastic and immunosuppressive drugs, initial encounter: Secondary | ICD-10-CM

## 2014-09-18 DIAGNOSIS — M545 Low back pain: Secondary | ICD-10-CM | POA: Diagnosis not present

## 2014-09-18 DIAGNOSIS — D6959 Other secondary thrombocytopenia: Secondary | ICD-10-CM

## 2014-09-18 DIAGNOSIS — C3491 Malignant neoplasm of unspecified part of right bronchus or lung: Secondary | ICD-10-CM

## 2014-09-18 LAB — COMPREHENSIVE METABOLIC PANEL (CC13)
ALT: 36 U/L (ref 0–55)
ANION GAP: 11 meq/L (ref 3–11)
AST: 64 U/L — AB (ref 5–34)
Albumin: 3.3 g/dL — ABNORMAL LOW (ref 3.5–5.0)
Alkaline Phosphatase: 195 U/L — ABNORMAL HIGH (ref 40–150)
BUN: 7.4 mg/dL (ref 7.0–26.0)
CHLORIDE: 98 meq/L (ref 98–109)
CO2: 28 mEq/L (ref 22–29)
Calcium: 9.2 mg/dL (ref 8.4–10.4)
Creatinine: 0.7 mg/dL (ref 0.7–1.3)
Glucose: 140 mg/dl (ref 70–140)
Potassium: 3.2 mEq/L — ABNORMAL LOW (ref 3.5–5.1)
Sodium: 137 mEq/L (ref 136–145)
Total Bilirubin: 0.55 mg/dL (ref 0.20–1.20)
Total Protein: 6.6 g/dL (ref 6.4–8.3)

## 2014-09-18 LAB — CBC WITH DIFFERENTIAL/PLATELET
BASO%: 0.1 % (ref 0.0–2.0)
BASOS ABS: 0 10*3/uL (ref 0.0–0.1)
EOS ABS: 0 10*3/uL (ref 0.0–0.5)
EOS%: 0 % (ref 0.0–7.0)
HCT: 25.4 % — ABNORMAL LOW (ref 38.4–49.9)
HGB: 8.7 g/dL — ABNORMAL LOW (ref 13.0–17.1)
LYMPH%: 11.1 % — ABNORMAL LOW (ref 14.0–49.0)
MCH: 31.5 pg (ref 27.2–33.4)
MCHC: 34.3 g/dL (ref 32.0–36.0)
MCV: 92 fL (ref 79.3–98.0)
MONO#: 1.1 10*3/uL — ABNORMAL HIGH (ref 0.1–0.9)
MONO%: 10.4 % (ref 0.0–14.0)
NEUT#: 8.6 10*3/uL — ABNORMAL HIGH (ref 1.5–6.5)
NEUT%: 78.4 % — ABNORMAL HIGH (ref 39.0–75.0)
Platelets: 72 10*3/uL — ABNORMAL LOW (ref 140–400)
RBC: 2.76 10*6/uL — ABNORMAL LOW (ref 4.20–5.82)
RDW: 16.5 % — ABNORMAL HIGH (ref 11.0–14.6)
WBC: 10.9 10*3/uL — ABNORMAL HIGH (ref 4.0–10.3)
lymph#: 1.2 10*3/uL (ref 0.9–3.3)

## 2014-09-18 LAB — TECHNOLOGIST REVIEW

## 2014-09-18 MED ORDER — PROMETHAZINE HCL 25 MG/ML IJ SOLN
25.0000 mg | Freq: Once | INTRAMUSCULAR | Status: AC
  Administered 2014-09-18: 25 mg via INTRAVENOUS
  Filled 2014-09-18: qty 1

## 2014-09-18 MED ORDER — HEPARIN SOD (PORK) LOCK FLUSH 100 UNIT/ML IV SOLN
500.0000 [IU] | Freq: Once | INTRAVENOUS | Status: DC | PRN
  Filled 2014-09-18: qty 5

## 2014-09-18 MED ORDER — SODIUM CHLORIDE 0.9 % IV SOLN
Freq: Once | INTRAVENOUS | Status: AC
  Administered 2014-09-18: 11:00:00 via INTRAVENOUS

## 2014-09-18 MED ORDER — MORPHINE SULFATE 15 MG PO TABS: 15.0000 mg | ORAL_TABLET | ORAL | Status: DC | PRN

## 2014-09-18 MED ORDER — SODIUM CHLORIDE 0.9 % IJ SOLN
10.0000 mL | INTRAMUSCULAR | Status: DC | PRN
Start: 2014-09-18 — End: 2014-09-18
  Administered 2014-09-18: 10 mL via INTRAVENOUS
  Filled 2014-09-18: qty 10

## 2014-09-18 MED ORDER — HEPARIN SOD (PORK) LOCK FLUSH 100 UNIT/ML IV SOLN
250.0000 [IU] | Freq: Once | INTRAVENOUS | Status: DC | PRN
  Filled 2014-09-18: qty 5

## 2014-09-18 MED ORDER — SODIUM CHLORIDE 0.9 % IJ SOLN
10.0000 mL | INTRAMUSCULAR | Status: DC | PRN
  Filled 2014-09-18: qty 10

## 2014-09-18 MED ORDER — ALTEPLASE 2 MG IJ SOLR
2.0000 mg | Freq: Once | INTRAMUSCULAR | Status: DC | PRN
  Filled 2014-09-18: qty 2
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
## 2014-09-18 NOTE — Assessment & Plan Note (Signed)
This is likely due to recent treatment. The patient denies recent history of bleeding such as epistaxis, hematuria or hematochezia. He is asymptomatic from the anemia. I will observe for now.  He does not require transfusion now. I will hold treatment this week and reduce the dose of carboplatin

## 2014-09-18 NOTE — Telephone Encounter (Signed)
per pof to sch pt appt-sent MW email to see if new cycle was sch-pt to get updated copy b4 leaving trmt room

## 2014-09-18 NOTE — Patient Instructions (Signed)
Dehydration, Adult Dehydration is when you lose more fluids from the body than you take in. Vital organs like the kidneys, brain, and heart cannot function without a proper amount of fluids and salt. Any loss of fluids from the body can cause dehydration.  CAUSES   Vomiting.  Diarrhea.  Excessive sweating.  Excessive urine output.  Fever. SYMPTOMS  Mild dehydration  Thirst.  Dry lips.  Slightly dry mouth. Moderate dehydration  Very dry mouth.  Sunken eyes.  Skin does not bounce back quickly when lightly pinched and released.  Dark urine and decreased urine production.  Decreased tear production.  Headache. Severe dehydration  Very dry mouth.  Extreme thirst.  Rapid, weak pulse (more than 100 beats per minute at rest).  Cold hands and feet.  Not able to sweat in spite of heat and temperature.  Rapid breathing.  Blue lips.  Confusion and lethargy.  Difficulty being awakened.  Minimal urine production.  No tears. DIAGNOSIS  Your caregiver will diagnose dehydration based on your symptoms and your exam. Blood and urine tests will help confirm the diagnosis. The diagnostic evaluation should also identify the cause of dehydration. TREATMENT  Treatment of mild or moderate dehydration can often be done at home by increasing the amount of fluids that you drink. It is best to drink small amounts of fluid more often. Drinking too much at one time can make vomiting worse. Refer to the home care instructions below. Severe dehydration needs to be treated at the hospital where you will probably be given intravenous (IV) fluids that contain water and electrolytes. HOME CARE INSTRUCTIONS   Ask your caregiver about specific rehydration instructions.  Drink enough fluids to keep your urine clear or pale yellow.  Drink small amounts frequently if you have nausea and vomiting.  Eat as you normally do.  Avoid:  Foods or drinks high in sugar.  Carbonated  drinks.  Juice.  Extremely hot or cold fluids.  Drinks with caffeine.  Fatty, greasy foods.  Alcohol.  Tobacco.  Overeating.  Gelatin desserts.  Wash your hands well to avoid spreading bacteria and viruses.  Only take over-the-counter or prescription medicines for pain, discomfort, or fever as directed by your caregiver.  Ask your caregiver if you should continue all prescribed and over-the-counter medicines.  Keep all follow-up appointments with your caregiver. SEEK MEDICAL CARE IF:  You have abdominal pain and it increases or stays in one area (localizes).  You have a rash, stiff neck, or severe headache.  You are irritable, sleepy, or difficult to awaken.  You are weak, dizzy, or extremely thirsty. SEEK IMMEDIATE MEDICAL CARE IF:   You are unable to keep fluids down or you get worse despite treatment.  You have frequent episodes of vomiting or diarrhea.  You have blood or green matter (bile) in your vomit.  You have blood in your stool or your stool looks black and tarry.  You have not urinated in 6 to 8 hours, or you have only urinated a small amount of very dark urine.  You have a fever.  You faint. MAKE SURE YOU:   Understand these instructions.  Will watch your condition.  Will get help right away if you are not doing well or get worse. Document Released: 02/01/2005 Document Revised: 04/26/2011 Document Reviewed: 09/21/2010 [employer] Patient Information 2015 [employer]. This information is not intended to replace advice given to you by your health care provider. Make sure you discuss any questions you have with your health care   provider.  

## 2014-09-18 NOTE — Assessment & Plan Note (Signed)
This is likely due to recent treatment. The patient denies recent history of bleeding such as epistaxis, hematuria or hematochezia. He is asymptomatic from the low platelet count. I will observe for now.  he does not require transfusion now. I will hold treatment this week and reduce dose of carboplatin next cycle

## 2014-09-18 NOTE — Assessment & Plan Note (Signed)
He is not feeling well Labs showed pancytopenia I recommend holding off Rx today, delay one week and recheck labs next week I plan to reduce the dose of carboplatin next cycle

## 2014-09-18 NOTE — Telephone Encounter (Signed)
I have adjusted 8/31

## 2014-09-18 NOTE — Telephone Encounter (Signed)
Per staff message, RN phone call and POF I have scheduled appts. Advised scheduler of appts. JMW

## 2014-09-18 NOTE — Assessment & Plan Note (Signed)
I will prescribe IVf and IV anti-emetics today I will change anti-emetics to IV Aloxi and Dex

## 2014-09-18 NOTE — Patient Instructions (Signed)

## 2014-09-18 NOTE — Assessment & Plan Note (Signed)
He has significant back pain and abdominal pain from recent surgery. I recommend to stay on IR morphine for now. This appeared to be under control and he does not want any further surgery. We discussed narcotic refill policy.

## 2014-09-19 ENCOUNTER — Ambulatory Visit: Payer: Self-pay

## 2014-09-20 ENCOUNTER — Ambulatory Visit: Payer: Self-pay

## 2014-09-21 ENCOUNTER — Ambulatory Visit: Payer: Self-pay

## 2014-09-23 ENCOUNTER — Inpatient Hospital Stay (HOSPITAL_COMMUNITY)
Admission: EM | Admit: 2014-09-23 | Discharge: 2014-09-27 | DRG: 055 | Disposition: A | Discharge status: Home Health | Payer: Medicaid Other | Attending: Internal Medicine | Admitting: Internal Medicine

## 2014-09-23 ENCOUNTER — Emergency Department (HOSPITAL_COMMUNITY): Payer: Medicaid Other

## 2014-09-23 ENCOUNTER — Encounter (HOSPITAL_COMMUNITY): Payer: Self-pay | Admitting: Emergency Medicine

## 2014-09-23 DIAGNOSIS — C7931 Secondary malignant neoplasm of brain: Secondary | ICD-10-CM | POA: Diagnosis present

## 2014-09-23 DIAGNOSIS — I10 Essential (primary) hypertension: Secondary | ICD-10-CM | POA: Diagnosis present

## 2014-09-23 DIAGNOSIS — D6481 Anemia due to antineoplastic chemotherapy: Secondary | ICD-10-CM | POA: Diagnosis present

## 2014-09-23 DIAGNOSIS — R4701 Aphasia: Secondary | ICD-10-CM | POA: Diagnosis present

## 2014-09-23 DIAGNOSIS — C7949 Secondary malignant neoplasm of other parts of nervous system: Secondary | ICD-10-CM | POA: Diagnosis present

## 2014-09-23 DIAGNOSIS — Z79899 Other long term (current) drug therapy: Secondary | ICD-10-CM | POA: Diagnosis not present

## 2014-09-23 DIAGNOSIS — Z87891 Personal history of nicotine dependence: Secondary | ICD-10-CM | POA: Diagnosis not present

## 2014-09-23 DIAGNOSIS — C799 Secondary malignant neoplasm of unspecified site: Secondary | ICD-10-CM | POA: Diagnosis not present

## 2014-09-23 DIAGNOSIS — R569 Unspecified convulsions: Secondary | ICD-10-CM | POA: Diagnosis present

## 2014-09-23 DIAGNOSIS — G40909 Epilepsy, unspecified, not intractable, without status epilepticus: Secondary | ICD-10-CM | POA: Diagnosis not present

## 2014-09-23 DIAGNOSIS — T451X5A Adverse effect of antineoplastic and immunosuppressive drugs, initial encounter: Secondary | ICD-10-CM | POA: Diagnosis present

## 2014-09-23 DIAGNOSIS — C3411 Malignant neoplasm of upper lobe, right bronchus or lung: Secondary | ICD-10-CM | POA: Diagnosis not present

## 2014-09-23 DIAGNOSIS — E876 Hypokalemia: Secondary | ICD-10-CM | POA: Diagnosis present

## 2014-09-23 DIAGNOSIS — C787 Secondary malignant neoplasm of liver and intrahepatic bile duct: Secondary | ICD-10-CM | POA: Diagnosis present

## 2014-09-23 DIAGNOSIS — Z923 Personal history of irradiation: Secondary | ICD-10-CM

## 2014-09-23 DIAGNOSIS — Z933 Colostomy status: Secondary | ICD-10-CM

## 2014-09-23 DIAGNOSIS — C3490 Malignant neoplasm of unspecified part of unspecified bronchus or lung: Secondary | ICD-10-CM | POA: Diagnosis present

## 2014-09-23 DIAGNOSIS — E119 Type 2 diabetes mellitus without complications: Secondary | ICD-10-CM

## 2014-09-23 DIAGNOSIS — Z9049 Acquired absence of other specified parts of digestive tract: Secondary | ICD-10-CM

## 2014-09-23 DIAGNOSIS — G40901 Epilepsy, unspecified, not intractable, with status epilepticus: Secondary | ICD-10-CM | POA: Diagnosis present

## 2014-09-23 DIAGNOSIS — G40301 Generalized idiopathic epilepsy and epileptic syndromes, not intractable, with status epilepticus: Secondary | ICD-10-CM | POA: Diagnosis not present

## 2014-09-23 LAB — CBC WITH DIFFERENTIAL/PLATELET
BASOS ABS: 0 10*3/uL (ref 0.0–0.1)
BASOS PCT: 0 % (ref 0–1)
EOS ABS: 0 10*3/uL (ref 0.0–0.7)
EOS PCT: 0 % (ref 0–5)
HCT: 27.7 % — ABNORMAL LOW (ref 39.0–52.0)
HEMOGLOBIN: 9 g/dL — AB (ref 13.0–17.0)
LYMPHS ABS: 1.1 10*3/uL (ref 0.7–4.0)
LYMPHS PCT: 10 % — AB (ref 12–46)
MCH: 31 pg (ref 26.0–34.0)
MCHC: 32.5 g/dL (ref 30.0–36.0)
MCV: 95.5 fL (ref 78.0–100.0)
MONO ABS: 0.2 10*3/uL (ref 0.1–1.0)
Monocytes Relative: 2 % — ABNORMAL LOW (ref 3–12)
NEUTROS PCT: 88 % — AB (ref 43–77)
Neutro Abs: 9.9 10*3/uL — ABNORMAL HIGH (ref 1.7–7.7)
PLATELETS: 319 10*3/uL (ref 150–400)
RBC: 2.9 MIL/uL — AB (ref 4.22–5.81)
RDW: 18.3 % — AB (ref 11.5–15.5)
WBC: 11.2 10*3/uL — ABNORMAL HIGH (ref 4.0–10.5)

## 2014-09-23 LAB — BASIC METABOLIC PANEL
Anion gap: 11 (ref 5–15)
BUN: <5 mg/dL — ABNORMAL LOW (ref 6–20)
CALCIUM: 8.4 mg/dL — AB (ref 8.9–10.3)
CHLORIDE: 99 mmol/L — AB (ref 101–111)
CO2: 26 mmol/L (ref 22–32)
Creatinine, Ser: 0.52 mg/dL — ABNORMAL LOW (ref 0.61–1.24)
Glucose, Bld: 184 mg/dL — ABNORMAL HIGH (ref 65–99)
Potassium: 3.1 mmol/L — ABNORMAL LOW (ref 3.5–5.1)
Sodium: 136 mmol/L (ref 135–145)

## 2014-09-23 LAB — CBG MONITORING, ED: Glucose-Capillary: 187 mg/dL — ABNORMAL HIGH (ref 65–99)

## 2014-09-23 MED ORDER — SODIUM CHLORIDE 0.9 % IV SOLN: 20.0000 mg/kg | Freq: Once | INTRAVENOUS | Status: DC

## 2014-09-23 MED ORDER — SODIUM CHLORIDE 0.9 % IV SOLN
1250.0000 mg | Freq: Once | INTRAVENOUS | Status: AC
  Administered 2014-09-24: 1250 mg via INTRAVENOUS
  Filled 2014-09-23: qty 25

## 2014-09-23 MED ORDER — SODIUM CHLORIDE 0.9 % IV SOLN
1000.0000 mg | Freq: Once | INTRAVENOUS | Status: AC
  Administered 2014-09-23: 1000 mg via INTRAVENOUS
  Filled 2014-09-23: qty 10

## 2014-09-23 MED ORDER — SODIUM CHLORIDE 0.9 % IV SOLN
200.0000 mg | Freq: Once | INTRAVENOUS | Status: AC
  Administered 2014-09-23: 200 mg via INTRAVENOUS
  Filled 2014-09-23: qty 20
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
## 2014-09-23 NOTE — ED Provider Notes (Signed)
History given by family member patient last normal 1:30 PM today. She called him on the telephone at 4:30 PM and found him to be somewhat sleepy. EMS reported the patient had 2 generalized seizures while in route. He was treated with diazepam 2.5 mg IV by EMS. On exam patient  is alert. Speaks in nonsensical terms. Moves all extremities, does not follow simple commands . Patient with known brain metastases. Family member he's been compliant with Keppra.   Orlie Dakin, MD 09/24/14 [phone]

## 2014-09-23 NOTE — ED Notes (Signed)
Per family pt was last seen normal at 1300. When Daughter came home pt was lethargic and less alert and confused. Pt had two witnessed seizures in route to ED lasting 2 minutes with full body jerking motions. Pt is alert but confused. Not oriented to person or place.

## 2014-09-23 NOTE — ED Notes (Signed)
CHECKED CBG 187
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
## 2014-09-24 ENCOUNTER — Encounter (HOSPITAL_COMMUNITY): Payer: Self-pay | Admitting: General Practice

## 2014-09-24 ENCOUNTER — Inpatient Hospital Stay (HOSPITAL_COMMUNITY): Payer: Medicaid Other

## 2014-09-24 ENCOUNTER — Other Ambulatory Visit: Payer: Self-pay | Admitting: Hematology and Oncology

## 2014-09-24 DIAGNOSIS — G8929 Other chronic pain: Secondary | ICD-10-CM

## 2014-09-24 DIAGNOSIS — R410 Disorientation, unspecified: Secondary | ICD-10-CM

## 2014-09-24 DIAGNOSIS — C3490 Malignant neoplasm of unspecified part of unspecified bronchus or lung: Secondary | ICD-10-CM

## 2014-09-24 DIAGNOSIS — C3411 Malignant neoplasm of upper lobe, right bronchus or lung: Secondary | ICD-10-CM

## 2014-09-24 DIAGNOSIS — R569 Unspecified convulsions: Secondary | ICD-10-CM

## 2014-09-24 DIAGNOSIS — G40909 Epilepsy, unspecified, not intractable, without status epilepticus: Secondary | ICD-10-CM

## 2014-09-24 DIAGNOSIS — M545 Low back pain: Secondary | ICD-10-CM

## 2014-09-24 DIAGNOSIS — D72829 Elevated white blood cell count, unspecified: Secondary | ICD-10-CM

## 2014-09-24 DIAGNOSIS — G40301 Generalized idiopathic epilepsy and epileptic syndromes, not intractable, with status epilepticus: Secondary | ICD-10-CM

## 2014-09-24 DIAGNOSIS — D6481 Anemia due to antineoplastic chemotherapy: Secondary | ICD-10-CM

## 2014-09-24 DIAGNOSIS — C7931 Secondary malignant neoplasm of brain: Principal | ICD-10-CM

## 2014-09-24 DIAGNOSIS — C787 Secondary malignant neoplasm of liver and intrahepatic bile duct: Secondary | ICD-10-CM

## 2014-09-24 LAB — URINALYSIS, ROUTINE W REFLEX MICROSCOPIC
Bilirubin Urine: NEGATIVE
Glucose, UA: NEGATIVE mg/dL
HGB URINE DIPSTICK: NEGATIVE
Ketones, ur: 15 mg/dL — AB
LEUKOCYTES UA: NEGATIVE
Nitrite: NEGATIVE
PROTEIN: NEGATIVE mg/dL
Specific Gravity, Urine: 1.012 (ref 1.005–1.030)
Urobilinogen, UA: 0.2 mg/dL (ref 0.0–1.0)
pH: 7 (ref 5.0–8.0)

## 2014-09-24 LAB — GLUCOSE, CAPILLARY
Glucose-Capillary: 111 mg/dL — ABNORMAL HIGH (ref 65–99)
Glucose-Capillary: 117 mg/dL — ABNORMAL HIGH (ref 65–99)

## 2014-09-24 LAB — ALBUMIN: ALBUMIN: 2.9 g/dL — AB (ref 3.5–5.0)

## 2014-09-24 LAB — PHENYTOIN LEVEL, TOTAL: Phenytoin Lvl: 7.7 ug/mL — ABNORMAL LOW (ref 10.0–20.0)

## 2014-09-24 LAB — MRSA PCR SCREENING: MRSA by PCR: NEGATIVE

## 2014-09-24 MED ORDER — SODIUM CHLORIDE 0.9 % IV SOLN
75.0000 mL/h | INTRAVENOUS | Status: DC
  Administered 2014-09-24 – 2014-09-25 (×3): 75 mL/h via INTRAVENOUS

## 2014-09-24 MED ORDER — POTASSIUM CHLORIDE 20 MEQ PO PACK: 20.0000 meq | PACK | Freq: Once | ORAL | Status: DC

## 2014-09-24 MED ORDER — POTASSIUM CHLORIDE 10 MEQ/100ML IV SOLN
10.0000 meq | INTRAVENOUS | Status: AC
  Administered 2014-09-24 (×4): 10 meq via INTRAVENOUS
  Filled 2014-09-24 (×4): qty 100

## 2014-09-24 MED ORDER — ENOXAPARIN SODIUM 40 MG/0.4ML ~~LOC~~ SOLN
40.0000 mg | Freq: Every day | SUBCUTANEOUS | Status: DC
  Administered 2014-09-24 – 2014-09-26 (×4): 40 mg via SUBCUTANEOUS
  Filled 2014-09-24 (×4): qty 0.4

## 2014-09-24 MED ORDER — LEVETIRACETAM 500 MG PO TABS: 500.0000 mg | ORAL_TABLET | Freq: Two times a day (BID) | ORAL | Status: DC

## 2014-09-24 MED ORDER — MORPHINE SULFATE 15 MG PO TABS
15.0000 mg | ORAL_TABLET | ORAL | Status: AC | PRN
  Administered 2014-09-24 (×2): 15 mg via ORAL
  Filled 2014-09-24 (×2): qty 1

## 2014-09-24 MED ORDER — INSULIN ASPART 100 UNIT/ML ~~LOC~~ SOLN
0.0000 [IU] | Freq: Four times a day (QID) | SUBCUTANEOUS | Status: DC
  Administered 2014-09-26: 1 [IU] via SUBCUTANEOUS

## 2014-09-24 MED ORDER — LEVETIRACETAM 500 MG PO TABS
1000.0000 mg | ORAL_TABLET | Freq: Two times a day (BID) | ORAL | Status: DC
  Administered 2014-09-24 (×2): 1000 mg via ORAL
  Filled 2014-09-24: qty 2
  Filled 2014-09-24: qty 4

## 2014-09-24 MED ORDER — LORAZEPAM 2 MG/ML IJ SOLN
1.0000 mg | INTRAMUSCULAR | Status: DC | PRN
  Administered 2014-09-24 – 2014-09-25 (×3): 2 mg via INTRAVENOUS
  Filled 2014-09-24 (×3): qty 1

## 2014-09-24 MED ORDER — POTASSIUM CHLORIDE CRYS ER 20 MEQ PO TBCR
20.0000 meq | EXTENDED_RELEASE_TABLET | Freq: Once | ORAL | Status: AC
Start: 2014-09-24 — End: 2014-09-24
  Administered 2014-09-24: 20 meq via ORAL
  Filled 2014-09-24 (×2): qty 1

## 2014-09-24 MED ORDER — POTASSIUM CHLORIDE 10 MEQ/100ML IV SOLN: 10.0000 meq | INTRAVENOUS | Status: DC

## 2014-09-24 MED ORDER — HYDROCODONE-ACETAMINOPHEN 5-325 MG PO TABS
1.0000 | ORAL_TABLET | ORAL | Status: DC | PRN
  Administered 2014-09-24 – 2014-09-27 (×12): 1 via ORAL
  Filled 2014-09-24 (×12): qty 1

## 2014-09-24 MED ORDER — PHENYTOIN SODIUM 50 MG/ML IJ SOLN
100.0000 mg | Freq: Three times a day (TID) | INTRAMUSCULAR | Status: DC
  Administered 2014-09-24 – 2014-09-27 (×10): 100 mg via INTRAVENOUS
  Filled 2014-09-24 (×15): qty 2

## 2014-09-24 NOTE — Progress Notes (Signed)
EEG Completed; Results Pending  

## 2014-09-24 NOTE — Procedures (Addendum)
EEG report.  Brief clinical history: 44 y.o. male with a history of seizures secondary to brain metastasis from small cell lung cancer. He had a seizure in March which prompted his diagnosis. He has undergone brain radiation. He was continued on Keppra and has been maintained on this without seizures until this admission..   Technique: this is a 17 channel routine scalp EEG performed at the bedside with bipolar and monopolar montages arranged in accordance to the international 10/20 system of electrode placement. One channel was dedicated to EKG recording.  No sleep was recorded. activating procedures performed.  Description:In the wakeful state, the overall background is disorganized with a discernible medium to high amplitude 6 Hz activity , posterior dominant, poorly sustained. Nearly continuous train of repetitive high amplitude epileptiform discharges with phase reversal at T3, occasionally spreading to C3 noted. EKG showed sinus rhythm.  Impression: this is an abnormal awake EEG because of the aforementioned findings. The study is indicative of global cerebral dysfunction and likely focal onset electrographic seizures emanating from the left anterior temporal region.  Clinical correlation is advised.   Dorian Pod, MD
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
## 2014-09-24 NOTE — Progress Notes (Signed)
Patient Demographics:    Coran Dipaola, is a 44 y.o. male, DOB - Sep 25, 1970, QBH:[id number]  Admit date - 09/23/2014   Admitting Physician Gennaro Africa, MD  Outpatient Primary MD for the patient is Arnoldo Morale, MD  LOS - 1   Chief Complaint  Patient presents with  . Seizures        Subjective:    Quandarius Nill today has, No headache, No chest pain, No abdominal pain - No Nausea, No new weakness tingling or numbness, No Cough - SOB.     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 Neurology on board, EEG ordered and pending, currently on Keppra and Dilantin which will be continued, IV Ativan for breakthrough seizure. We'll keep him nothing by mouth with gentle hydration for now. Of note patient had history of seizures in the past and was kept on Keppra which has been discontinued by neurology.   2. 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   3. DM type II. Hold Glucophage ISS.   4. 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 Supportive care.   5. Hypokalemia. Replaced we'll monitor.    Code Status : Full  Family Communication  : Wife  Disposition Plan  : TBD  Consults  :  Neuro, Onc  Procedures  :   EEG  DVT Prophylaxis  :  Lovenox    Lab Results  Component Value Date   PLT 319 09/23/2014    Inpatient Medications  Scheduled Meds: . enoxaparin (LOVENOX) injection  40 mg Subcutaneous QHS  . levETIRAcetam  1,000 mg Oral BID  . phenytoin (DILANTIN) IV  100 mg Intravenous 3 times per day    Continuous Infusions: . sodium chloride 75 mL/hr (09/24/14 0226)   PRN Meds:.LORazepam  Antibiotics  :    Anti-infectives    None        Objective:   Filed Vitals:   09/23/14 2300 09/23/14 2315 09/24/14 0116 09/24/14 0648  BP: 166/97 163/102 149/98 139/89  Pulse: 107  104 114  Temp:   98.5 F (36.9 C) 98 F (36.7 C)  TempSrc:   Oral Oral  Resp:  '16 18 18  '$ Height:      Weight:      SpO2: 95%  99% 100%    Wt Readings from Last 3 Encounters:  09/23/14 69.4 kg (153 lb)  09/18/14 69.446 kg (153 lb 1.6 oz)  09/04/14 69.763 kg (153 lb 12.8 oz)     Intake/Output Summary (Last 24 hours) at 09/24/14 1254 Last data filed at 09/24/14 1024  Gross per 24 hour  Intake    110 ml  Output    800 ml  Net   -690 ml     Physical Exam  Awake but confused, No new F.N deficits, Normal affect [place].AT,PERRAL Supple Neck,No JVD, No cervical lymphadenopathy appriciated.  Symmetrical Chest wall movement, Good air movement bilaterally, CTAB RRR,No Gallops,Rubs or new Murmurs, No Parasternal Heave +ve B.Sounds, Abd Soft, No tenderness, No organomegaly  appriciated, No rebound - guarding or rigidity. No Cyanosis, Clubbing or edema, No new Rash or bruise      Data Review:   Micro Results Recent Results (from the past 240 hour(s))  TECHNOLOGIST REVIEW     Status: None   Collection Time: 09/18/14  9:26 AM  Result Value Ref Range Status   Technologist Review Spherocytes present.  Final  MRSA PCR Screening     Status: None   Collection Time: 09/24/14  2:40 AM  Result Value Ref Range Status   MRSA by PCR NEGATIVE NEGATIVE Final    Comment:        The GeneXpert MRSA Assay (FDA approved for NASAL specimens only), is one component of a comprehensive MRSA colonization surveillance program. It is not intended to diagnose MRSA infection nor to guide or monitor treatment for MRSA infections.     Radiology Reports Ct Head Wo Contrast  09/23/2014   CLINICAL DATA:  Acute onset of  seizure. Personal history of lung cancer. Initial encounter.  EXAM: CT HEAD WITHOUT CONTRAST  TECHNIQUE: Contiguous axial images were obtained from the base of the skull through the vertex without intravenous contrast.  COMPARISON:  None.  FINDINGS: There is no evidence of acute infarction, mass lesion, or intra- or extra-axial hemorrhage on CT.  The posterior fossa, including the cerebellum, brainstem and fourth ventricle, is within normal limits. The third and lateral ventricles, and basal ganglia are unremarkable in appearance. The cerebral hemispheres are symmetric in appearance, with normal gray-white differentiation. No mass effect or midline shift is seen.  There is no evidence of fracture; postoperative change is noted at the left parietal calvarium. The orbits are within normal limits. The paranasal sinuses and mastoid air cells are well-aerated. No significant soft tissue abnormalities are seen.  IMPRESSION: Unremarkable noncontrast CT of the head.   Electronically Signed   By: Garald Balding M.D.   On: 09/23/2014 22:53   Ct Chest W Contrast  09/02/2014   CLINICAL DATA:  Metastatic small cell lung cancer with ongoing chemotherapy and brain metastases. Liver metastases. Recurrent hemoptysis.  EXAM: CT CHEST, ABDOMEN, AND PELVIS WITH CONTRAST  TECHNIQUE: Multidetector CT imaging of the chest, abdomen and pelvis was performed following the standard protocol during bolus administration of intravenous contrast.  CONTRAST:  124m OMNIPAQUE IOHEXOL 300 MG/ML  SOLN  COMPARISON:  07/30/2014.  FINDINGS: CT CHEST FINDINGS  Mediastinum/Nodes: Left subclavian Port-A-Cath terminates in the high right atrium. Bulky mediastinal adenopathy measures up to 1.9 x 4.0 cm in the lower right paratracheal station (previously 3.5 x 5.2 cm). Right hilar adenopathy measures 2.3 x 2.6 cm (previously 3.5 x 3.8 cm). Narrowing of the distal right upper lobe pulmonary artery, similar to the prior exam. No axillary adenopathy.  Three-vessel coronary artery calcification. Heart size normal. No pericardial effusion.  Lungs/Pleura: Small right pleural effusion, decreased. A nodule in the medial aspect of the anterior segment right upper lobe measures 2.0 x 2.6 cm (previously 2.6 x 3.5 cm). Progressive areas of rounded consolidation and ground-glass in the posterior segment right upper lobe. 4 mm nodule in the superior segment right lower lobe (series 4, image 28), likely stable. Left lung is clear. There is slight narrowing of the distal right upper lobe bronchus. Airway is otherwise unremarkable.  Musculoskeletal: No worrisome lytic or sclerotic lesions.  CT ABDOMEN AND PELVIS FINDINGS  Hepatobiliary: Ill-defined low-attenuation lesion in the the right hepatic lobe measures 2.8 cm, previously 3.9 cm. There may be a subtle peripherally hyper attenuating  a lesion in the peripheral aspect of segment 4 of the liver, measuring 1.3 cm (series 2, image 52), possibly better seen on the current study due to different phase of contrast enhancement. 1.7 cm lesion in the caudate (series 2, image 55), decreased from 2.2 cm No new lesions. Gallbladder is unremarkable. No biliary ductal dilatation.  Pancreas: Negative.  Spleen: Negative.  Adrenals/Urinary Tract: Adrenal glands and kidneys are unremarkable. Ureters are decompressed. Bladder is low in volume.  Stomach/Bowel: Stomach, small bowel and appendix are unremarkable. Left lower quadrant colostomy. Colon and rectum are otherwise unremarkable.  Vascular/Lymphatic: Atherosclerotic calcification of the arterial vasculature without abdominal aortic aneurysm. Portacaval lymph node measures 2.0 cm in short axis (previously 2.6 cm). A lymph node anterior to the right renal vein measures 1.5 cm (image 63), previously 2.0 cm.  Reproductive: Prostate is visualized.  Other: No free fluid. Mesenteries and peritoneum are otherwise unremarkable.  Musculoskeletal: No worrisome lytic or sclerotic lesions.  Compression deformities involving T11, L2 and L5 are unchanged.  IMPRESSION: 1. Interval response to therapy as evidenced by decrease in size of bulky mediastinal/right hilar adenopathy, right upper lobe nodule, small right pleural effusion and hepatic lesions. 2. Areas of rounded consolidation and surrounding ground-glass in the right upper lobe are progressive and may be due to pulmonary hemorrhage in this patient with a history of recurrent hemoptysis. 3. Slight improvement in adenopathy in the porta hepatis and adjacent right upper quadrant. 4. Three-vessel coronary artery calcification.   Electronically Signed   By: Lorin Picket M.D.   On: 09/02/2014 13:47   Ct Abdomen Pelvis W Contrast  09/02/2014   CLINICAL DATA:  Metastatic small cell lung cancer with ongoing chemotherapy and brain metastases. Liver metastases. Recurrent hemoptysis.  EXAM: CT CHEST, ABDOMEN, AND PELVIS WITH CONTRAST  TECHNIQUE: Multidetector CT imaging of the chest, abdomen and pelvis was performed following the standard protocol during bolus administration of intravenous contrast.  CONTRAST:  116m OMNIPAQUE IOHEXOL 300 MG/ML  SOLN  COMPARISON:  07/30/2014.  FINDINGS: CT CHEST FINDINGS  Mediastinum/Nodes: Left subclavian Port-A-Cath terminates in the high right atrium. Bulky mediastinal adenopathy measures up to 1.9 x 4.0 cm in the lower right paratracheal station (previously 3.5 x 5.2 cm). Right hilar adenopathy measures 2.3 x 2.6 cm (previously 3.5 x 3.8 cm). Narrowing of the distal right upper lobe pulmonary artery, similar to the prior exam. No axillary adenopathy. Three-vessel coronary artery calcification. Heart size normal. No pericardial effusion.  Lungs/Pleura: Small right pleural effusion, decreased. A nodule in the medial aspect of the anterior segment right upper lobe measures 2.0 x 2.6 cm (previously 2.6 x 3.5 cm). Progressive areas of rounded consolidation and ground-glass in the posterior segment right upper lobe. 4 mm  nodule in the superior segment right lower lobe (series 4, image 28), likely stable. Left lung is clear. There is slight narrowing of the distal right upper lobe bronchus. Airway is otherwise unremarkable.  Musculoskeletal: No worrisome lytic or sclerotic lesions.  CT ABDOMEN AND PELVIS FINDINGS  Hepatobiliary: Ill-defined low-attenuation lesion in the the right hepatic lobe measures 2.8 cm, previously 3.9 cm. There may be a subtle peripherally hyper attenuating a lesion in the peripheral aspect of segment 4 of the liver, measuring 1.3 cm (series 2, image 52), possibly better seen on the current study due to different phase of contrast enhancement. 1.7 cm lesion in the caudate (series 2, image 55), decreased from 2.2 cm No new lesions. Gallbladder is unremarkable. No biliary ductal dilatation.  Pancreas: Negative.  Spleen: Negative.  Adrenals/Urinary Tract: Adrenal glands and kidneys are unremarkable. Ureters are decompressed. Bladder is low in volume.  Stomach/Bowel: Stomach, small bowel and appendix are unremarkable. Left lower quadrant colostomy. Colon and rectum are otherwise unremarkable.  Vascular/Lymphatic: Atherosclerotic calcification of the arterial vasculature without abdominal aortic aneurysm. Portacaval lymph node measures 2.0 cm in short axis (previously 2.6 cm). A lymph node anterior to the right renal vein measures 1.5 cm (image 63), previously 2.0 cm.  Reproductive: Prostate is visualized.  Other: No free fluid. Mesenteries and peritoneum are otherwise unremarkable.  Musculoskeletal: No worrisome lytic or sclerotic lesions. Compression deformities involving T11, L2 and L5 are unchanged.  IMPRESSION: 1. Interval response to therapy as evidenced by decrease in size of bulky mediastinal/right hilar adenopathy, right upper lobe nodule, small right pleural effusion and hepatic lesions. 2. Areas of rounded consolidation and surrounding ground-glass in the right upper lobe are progressive and may be due to  pulmonary hemorrhage in this patient with a history of recurrent hemoptysis. 3. Slight improvement in adenopathy in the porta hepatis and adjacent right upper quadrant. 4. Three-vessel coronary artery calcification.   Electronically Signed   By: Lorin Picket M.D.   On: 09/02/2014 13:47   Dg Chest Portable 1 View  09/24/2014   CLINICAL DATA:  Initial valuation for acute pneumonia, shortness of breath.  EXAM: PORTABLE CHEST - 1 VIEW  COMPARISON:  None available.  FINDINGS: Left-sided Port-A-Cath in place with tip overlying the proximal -mid right atrium. Heart size at the upper limits of normal. Mediastinal silhouette within normal limits. Tracheal air column midline and patent.  Lungs are hypoinflated. There is patchy and hazy left basilar opacities, which may reflect atelectasis and/ or infiltrate. There is additional subtle asymmetric opacity within the right upper lobe, which may also reflect developing infectious infiltrate. This finding may in part be related to patient rotation (patient is rotated to the right). No pulmonary edema or pleural effusion. No pneumothorax.  No acute osseus abnormality.  IMPRESSION: 1. Shallow lung inflation with patchy and hazy left basilar opacities. While these findings may in part reflect atelectasis, possible early/developing infiltrate could be considered in the correct clinical setting. 2. Additional subtle asymmetric patchy opacity within the right upper lobe, also suspicious for possible developing infectious infiltrate. 3. No other active cardiopulmonary disease.   Electronically Signed   By: Jeannine Boga M.D.   On: 09/24/2014 00:18     CBC  Recent Labs Lab 09/18/14 0926 09/23/14 2129  WBC 10.9* 11.2*  HGB 8.7* 9.0*  HCT 25.4* 27.7*  PLT 72 Platelet count confirmed by slide estimate* 319  MCV 92.0 95.5  MCH 31.5 31.0  MCHC 34.3 32.5  RDW 16.5* 18.3*  LYMPHSABS 1.2 1.1  MONOABS 1.1* 0.2  EOSABS 0.0 0.0  BASOSABS 0.0 0.0    Chemistries    Recent Labs Lab 09/18/14 0926 09/23/14 2129  NA 137 136  K 3.2* 3.1*  CL  --  99*  CO2 28 26  GLUCOSE 140 184*  BUN 7.4 <5*  CREATININE 0.7 0.52*  CALCIUM 9.2 8.4*  AST 64*  --   ALT 36  --   ALKPHOS 195*  --   BILITOT 0.55  --    ------------------------------------------------------------------------------------------------------------------ estimated creatinine clearance is 113.3 mL/min (by C-G formula based on Cr of 0.52). ------------------------------------------------------------------------------------------------------------------ No results for input(s): HGBA1C in the last 72 hours. ------------------------------------------------------------------------------------------------------------------ No results for input(s): CHOL, HDL, LDLCALC, TRIG, CHOLHDL, LDLDIRECT in the last 72 hours. ------------------------------------------------------------------------------------------------------------------ No results for  input(s): TSH, T4TOTAL, T3FREE, THYROIDAB in the last 72 hours.  Invalid input(s): FREET3 ------------------------------------------------------------------------------------------------------------------ No results for input(s): VITAMINB12, FOLATE, FERRITIN, TIBC, IRON, RETICCTPCT in the last 72 hours.  Coagulation profile No results for input(s): INR, PROTIME in the last 168 hours.  No results for input(s): DDIMER in the last 72 hours.  Cardiac Enzymes No results for input(s): CKMB, TROPONINI, MYOGLOBIN in the last 168 hours.  Invalid input(s): CK ------------------------------------------------------------------------------------------------------------------ Invalid input(s): POCBNP   Time Spent in minutes  35   Lillybeth Tal K M.D on 09/24/2014 at 12:54 PM  Between 7am to 7pm - Pager - [phone]  After 7pm go to [email or web address] - password [facility]  [facility] -  Office  [phone]

## 2014-09-24 NOTE — Progress Notes (Signed)
LTM EEG running/ tested event button/ educated Nurse tech/ informed reader/ no skin breakdown with hookup

## 2014-09-24 NOTE — Consult Note (Signed)
WOC ostomy consult note Stoma type/location: LLQ, end colostomy. Pt and wife independent with care Stomal assessment/size: aprox. 1 3/8" round, budded Peristomal assessment: pouch intact  Treatment options for stomal/peristomal skin: NA Output pasty, brown stool Ostomy pouching: 2pc. Used at home, supplies ordered for bedside nurse [place] if needed.  Education provided: Pt and wife independent and currently patient is confused and has altered mental status R/T seizures and metastatic brain dx. No education needed today   Patient also noted to have open midline wound, appears chronic non healing.   Wound type: surgical, non healing  Measurement: 3cm x 0.5cm x 0.2cm  Wound bed:dry, pale Drainage (amount, consistency, odor) none Periwound: intact with evidence of scarring and re-epithelialization  Dressing procedure/placement/frequency: Hydrogel to add moisture for moist wound healing, apply daily.   Discussed POC  bedside nurse.  Re consult if needed, will not follow at this time. Thanks  Nikiya Starn [place], [place] [phone])
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
## 2014-09-24 NOTE — Progress Notes (Addendum)
Subjective: Patient resting comfortably, when awoken he is able to tract my finger.  No active seizure activity.   Objective: Current vital signs: BP 139/89 mmHg  Pulse 114  Temp(Src) 98 F (36.7 C) (Oral)  Resp 18  Ht '5\' 10"'$  (1.778 m)  Wt 69.4 kg (153 lb)  BMI 21.95 kg/m2  SpO2 100% Vital signs in last 24 hours: Temp:  [98 F (36.7 C)-98.5 F (36.9 C)] 98 F (36.7 C) (08/09 0648) Pulse Rate:  [94-126] 114 (08/09 0648) Resp:  [15-25] 18 (08/09 0648) BP: (139-166)/(89-107) 139/89 mmHg (08/09 0648) SpO2:  [89 %-100 %] 100 % (08/09 0648) Weight:  [69.4 kg (153 lb)] 69.4 kg (153 lb) (08/08 2106)  Intake/Output from previous day: 08/08 0701 - 08/09 0700 In: 50 [I.V.:50] Out: 800 [Urine:600; Stool:200] Intake/Output this shift:   Nutritional status: Diet NPO time specified Except for: Sips with Meds  Neurologic Exam: General: Mental Status: Alert,does not follow verbal commands.  Has a significant aphasia with previous seizures.  Cranial Nerves: II: right field cut,  pupils equal, round, reactive to light and accommodation III,IV, VI: ptosis not present, extra-ocular motions intact bilaterally V,VII: smile symmetric, facial light touch sensation normal bilaterally VIII: hearing normal bilaterally IX,X: uvula rises symmetrically XI: bilateral shoulder shrug XII: midline tongue extension without atrophy or fasciculations  Motor: Moving all extremities with right less than left but will not follow formal commands.  Sensory: Pinprick and light touch intact throughout, bilaterally Deep Tendon Reflexes:  Right: Upper Extremity   Left: Upper extremity   biceps (C-5 to C-6) 2/4   biceps (C-5 to C-6) 2/4 tricep (C7) 2/4    triceps (C7) 2/4 Brachioradialis (C6) 2/4  Brachioradialis (C6) 2/4  Lower Extremity Lower Extremity  quadriceps (L-2 to L-4) 2/4   quadriceps (L-2 to L-4) 2/4 Achilles (S1) 2/4   Achilles (S1) 2/4      Lab Results: Basic Metabolic Panel:  Recent  Labs Lab 09/18/14 0926 09/23/14 2129  NA 137 136  K 3.2* 3.1*  CL  --  99*  CO2 28 26  GLUCOSE 140 184*  BUN 7.4 <5*  CREATININE 0.7 0.52*  CALCIUM 9.2 8.4*    Liver Function Tests:  Recent Labs Lab 09/18/14 0926  AST 64*  ALT 36  ALKPHOS 195*  BILITOT 0.55  PROT 6.6  ALBUMIN 3.3*   No results for input(s): LIPASE, AMYLASE in the last 168 hours. No results for input(s): AMMONIA in the last 168 hours.  CBC:  Recent Labs Lab 09/18/14 0926 09/23/14 2129  WBC 10.9* 11.2*  NEUTROABS 8.6* 9.9*  HGB 8.7* 9.0*  HCT 25.4* 27.7*  MCV 92.0 95.5  PLT 72 Platelet count confirmed by slide estimate* 319    Cardiac Enzymes: No results for input(s): CKTOTAL, CKMB, CKMBINDEX, TROPONINI in the last 168 hours.  Lipid Panel: No results for input(s): CHOL, TRIG, HDL, CHOLHDL, VLDL, LDLCALC in the last 168 hours.  CBG:  Recent Labs Lab 09/23/14 2124  GLUCAP 187*    Microbiology: Results for orders placed or performed during the hospital encounter of 09/23/14  MRSA PCR Screening     Status: None   Collection Time: 09/24/14  2:40 AM  Result Value Ref Range Status   MRSA by PCR NEGATIVE NEGATIVE Final    Comment:        The GeneXpert MRSA Assay (FDA approved for NASAL specimens only), is one component of a comprehensive MRSA colonization surveillance program. It is not intended to diagnose MRSA infection nor  to guide or monitor treatment for MRSA infections.     Coagulation Studies: No results for input(s): LABPROT, INR in the last 72 hours.  Imaging: Ct Head Wo Contrast  09/23/2014   CLINICAL DATA:  Acute onset of seizure. Personal history of lung cancer. Initial encounter.  EXAM: CT HEAD WITHOUT CONTRAST  TECHNIQUE: Contiguous axial images were obtained from the base of the skull through the vertex without intravenous contrast.  COMPARISON:  None.  FINDINGS: There is no evidence of acute infarction, mass lesion, or intra- or extra-axial hemorrhage on CT.  The  posterior fossa, including the cerebellum, brainstem and fourth ventricle, is within normal limits. The third and lateral ventricles, and basal ganglia are unremarkable in appearance. The cerebral hemispheres are symmetric in appearance, with normal gray-white differentiation. No mass effect or midline shift is seen.  There is no evidence of fracture; postoperative change is noted at the left parietal calvarium. The orbits are within normal limits. The paranasal sinuses and mastoid air cells are well-aerated. No significant soft tissue abnormalities are seen.  IMPRESSION: Unremarkable noncontrast CT of the head.   Electronically Signed   By: Garald Balding M.D.   On: 09/23/2014 22:53   Dg Chest Portable 1 View  09/24/2014   CLINICAL DATA:  Initial valuation for acute pneumonia, shortness of breath.  EXAM: PORTABLE CHEST - 1 VIEW  COMPARISON:  None available.  FINDINGS: Left-sided Port-A-Cath in place with tip overlying the proximal -mid right atrium. Heart size at the upper limits of normal. Mediastinal silhouette within normal limits. Tracheal air column midline and patent.  Lungs are hypoinflated. There is patchy and hazy left basilar opacities, which may reflect atelectasis and/ or infiltrate. There is additional subtle asymmetric opacity within the right upper lobe, which may also reflect developing infectious infiltrate. This finding may in part be related to patient rotation (patient is rotated to the right). No pulmonary edema or pleural effusion. No pneumothorax.  No acute osseus abnormality.  IMPRESSION: 1. Shallow lung inflation with patchy and hazy left basilar opacities. While these findings may in part reflect atelectasis, possible early/developing infiltrate could be considered in the correct clinical setting. 2. Additional subtle asymmetric patchy opacity within the right upper lobe, also suspicious for possible developing infectious infiltrate. 3. No other active cardiopulmonary disease.    Electronically Signed   By: Jeannine Boga M.D.   On: 09/24/2014 00:18    Medications:  Scheduled: . enoxaparin (LOVENOX) injection  40 mg Subcutaneous QHS  . levETIRAcetam  1,000 mg Oral BID  . phenytoin (DILANTIN) IV  100 mg Intravenous 3 times per day  . potassium chloride  10 mEq Intravenous Q1 Hr x 4    Assessment/Plan: 44 YO male with break through seizure. Keppra increased to 1 gram BID and Dilantin started at 100 mg TID following a fosphenytoin load of 1250 mg IV.  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 Patient had phase reversing sharp wave discharges throughout the recording, occurring about every 1-2 seconds. Continued aphasia is likely a clinical manifestation of continued focal left temporal seizure activity.  Recommend: 1) Will increase Keppra to 1500 mg every 12 hours 2) Pharmacy consult for Dilantin management , as Dilantin level obtained today was low (7.7) 3) long-term EEG/video monitoring 4) MRI of the brain without and with contrast  Etta Quill PA-C [facility] [phone]  09/24/2014, 10:00 AM  I personally participated in this patient's evaluation and management, including formulating the above clinical impression and management  recommendations reacted. We will continue to follow this patient closely with you.  Rush Farmer M.D. [facility] [phone]

## 2014-09-24 NOTE — ED Notes (Signed)
Hospitalist at bedside 

## 2014-09-25 ENCOUNTER — Other Ambulatory Visit: Payer: Self-pay

## 2014-09-25 ENCOUNTER — Ambulatory Visit: Payer: Self-pay

## 2014-09-25 LAB — CBC
HCT: 27.3 % — ABNORMAL LOW (ref 39.0–52.0)
Hemoglobin: 8.8 g/dL — ABNORMAL LOW (ref 13.0–17.0)
MCH: 30.6 pg (ref 26.0–34.0)
MCHC: 32.2 g/dL (ref 30.0–36.0)
MCV: 94.8 fL (ref 78.0–100.0)
Platelets: 378 10*3/uL (ref 150–400)
RBC: 2.88 MIL/uL — ABNORMAL LOW (ref 4.22–5.81)
RDW: 17.7 % — ABNORMAL HIGH (ref 11.5–15.5)
WBC: 7.2 10*3/uL (ref 4.0–10.5)

## 2014-09-25 LAB — COMPREHENSIVE METABOLIC PANEL
ALT: 29 U/L (ref 17–63)
AST: 54 U/L — ABNORMAL HIGH (ref 15–41)
Albumin: 2.9 g/dL — ABNORMAL LOW (ref 3.5–5.0)
Alkaline Phosphatase: 135 U/L — ABNORMAL HIGH (ref 38–126)
Anion gap: 11 (ref 5–15)
BUN: <5 mg/dL — ABNORMAL LOW (ref 6–20)
CO2: 24 mmol/L (ref 22–32)
Calcium: 8.5 mg/dL — ABNORMAL LOW (ref 8.9–10.3)
Chloride: 101 mmol/L (ref 101–111)
Creatinine, Ser: 0.49 mg/dL — ABNORMAL LOW (ref 0.61–1.24)
GFR calc Af Amer: >60 mL/min (ref >60)
GFR calc non Af Amer: >60 mL/min (ref >60)
Glucose, Bld: 91 mg/dL (ref 65–99)
Potassium: 2.9 mmol/L — ABNORMAL LOW (ref 3.5–5.1)
Sodium: 136 mmol/L (ref 135–145)
Total Bilirubin: 0.7 mg/dL (ref 0.3–1.2)
Total Protein: 5.9 g/dL — ABNORMAL LOW (ref 6.5–8.1)

## 2014-09-25 LAB — HEMOGLOBIN A1C
HEMOGLOBIN A1C: 5.6 % (ref 4.8–5.6)
MEAN PLASMA GLUCOSE: 114 mg/dL

## 2014-09-25 LAB — GLUCOSE, CAPILLARY
GLUCOSE-CAPILLARY: 105 mg/dL — AB (ref 65–99)
GLUCOSE-CAPILLARY: 107 mg/dL — AB (ref 65–99)
Glucose-Capillary: 104 mg/dL — ABNORMAL HIGH (ref 65–99)
Glucose-Capillary: 112 mg/dL — ABNORMAL HIGH (ref 65–99)

## 2014-09-25 LAB — MAGNESIUM: Magnesium: 1.3 mg/dL — ABNORMAL LOW (ref 1.7–2.4)

## 2014-09-25 MED ORDER — SODIUM CHLORIDE 0.9 % IJ SOLN
10.0000 mL | INTRAMUSCULAR | Status: DC | PRN
  Administered 2014-09-26: 10 mL
  Filled 2014-09-25: qty 40

## 2014-09-25 MED ORDER — POTASSIUM CHLORIDE 10 MEQ/100ML IV SOLN
10.0000 meq | INTRAVENOUS | Status: AC
  Administered 2014-09-25 (×4): 10 meq via INTRAVENOUS
  Filled 2014-09-25 (×4): qty 100

## 2014-09-25 MED ORDER — MAGNESIUM SULFATE 2 GM/50ML IV SOLN
2.0000 g | Freq: Once | INTRAVENOUS | Status: AC
  Administered 2014-09-25: 2 g via INTRAVENOUS
  Filled 2014-09-25: qty 50

## 2014-09-25 MED ORDER — SODIUM CHLORIDE 0.9 % IJ SOLN
10.0000 mL | Freq: Two times a day (BID) | INTRAMUSCULAR | Status: DC
  Administered 2014-09-25 – 2014-09-27 (×2): 10 mL

## 2014-09-25 MED ORDER — HYDRALAZINE HCL 20 MG/ML IJ SOLN
10.0000 mg | Freq: Once | INTRAMUSCULAR | Status: AC
  Administered 2014-09-25: 10 mg via INTRAVENOUS
  Filled 2014-09-25: qty 1

## 2014-09-25 MED ORDER — SODIUM CHLORIDE 0.9 % IV SOLN
75.0000 mL/h | INTRAVENOUS | Status: DC
  Administered 2014-09-25: 75 mL/h via INTRAVENOUS

## 2014-09-25 MED ORDER — LORAZEPAM 2 MG/ML IJ SOLN: 1.0000 mg | INTRAMUSCULAR | Status: DC | PRN

## 2014-09-25 MED ORDER — LORAZEPAM 2 MG/ML IJ SOLN
1.0000 mg | INTRAMUSCULAR | Status: DC | PRN
  Administered 2014-09-25 – 2014-09-26 (×5): 2 mg via INTRAVENOUS
  Filled 2014-09-25 (×5): qty 1

## 2014-09-25 MED ORDER — METOPROLOL TARTRATE 1 MG/ML IV SOLN: 5.0000 mg | INTRAVENOUS | Status: DC | PRN

## 2014-09-25 MED ORDER — POTASSIUM CHLORIDE 10 MEQ/100ML IV SOLN
10.0000 meq | INTRAVENOUS | Status: AC
  Administered 2014-09-25: 10 meq via INTRAVENOUS
  Filled 2014-09-25 (×3): qty 100

## 2014-09-25 MED ORDER — LEVETIRACETAM 750 MG PO TABS
1500.0000 mg | ORAL_TABLET | Freq: Two times a day (BID) | ORAL | Status: DC
  Administered 2014-09-25 – 2014-09-27 (×5): 1500 mg via ORAL
  Filled 2014-09-25: qty 2
  Filled 2014-09-25: qty 6
  Filled 2014-09-25 (×3): qty 2

## 2014-09-25 NOTE — Procedures (Signed)
Electroencephalogram report- LTM  Ordering Physician : Dr. Nicole Kindred EEG number: [id number]    Beginning date and time: 09/24/2014 5:24PM Ending date and time:  09/25/2014 9:11AM  Day of study:  1  Medications include: Keppra, Dilantin  HISTORY: This 24 hours of intensive EEG monitoring with simultaneous video monitoring was performed for this patient with witnessed seizure and altered mental status. This EEG was requested to rule out subclinical electrographic seizures.  TECHNICAL DESCRIPTION:  The study consists of a continuous 16-channel multi-montage digital video EEG recording with twenty-one electrodes placed according to the International 10-20 System. Additional leads included eye leads, and an EKG lead.    REPORT: The background activity consists of 8-9 Hz activity on the right.  The background is asymmetric (slowing on the left), but reactive to eye opening and stimulation.  Frequent discharges were seen in the central-temporal region, phase reversing at C3 and T3 on the longitudinal bipolar montage.  At the beginning of the recording, these discharges were frequent, almost periodic occurring at 0.5-1 Hz frequency.  The periodicity improves during the duration of the recording, but still continue occasionally in runs, especially in sleep.  No clear electrographic seizures were seen.  There were no pushbutton activations events during this recording.   INTERPRETATION: This is an abnormal EEG due to: 1) Left central temporal epileptiform discharges 2) Focal slowing in the left hemispheric region  Clinical Correlation: This EEG is consistent with focal neuronal dysfunction in the left hemisphere, along with epileptogenic potential in the same region.  The periodic discharges seen in the initial portion of the recording could be post ictal in nature.  Periodic discharges by themselves are nonspecific, and can be ictal or a marker of acute cortical injury.  Clinical correlation is required.

## 2014-09-25 NOTE — Progress Notes (Signed)
Dr Hilbert Bible ordered Hydralazine '10mg'$  to address earlier BP.  Will proceed to give once pharmacy has approved.

## 2014-09-25 NOTE — Progress Notes (Signed)
Patient Demographics:    [name], is a 44 y.o. male, DOB - 1970/08/18, KGY:[id number]  Admit date - 09/23/2014   Admitting Physician Gennaro Africa, MD  Outpatient Primary MD for the patient is Arnoldo Morale, MD  LOS - 2   Chief Complaint  Patient presents with  . Seizures        Subjective:    [name] today has, No headache, No chest pain, No abdominal pain - No Nausea, No new weakness tingling or numbness, No Cough - SOB.     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 Had history of seizures in the past and was on Keppra at home. Neurology on board, EEG confirms seizures, currently on Keppra and Dilantin which will be continued, IV Ativan for breakthrough seizure.   Discussed his case with neurologist Dr. Nicole Kindred on 09/25/2014, Keppra dose has been increased, I think patient is having intermittent bursts of breakthrough seizures which are presenting as agitation and aphasia. We'll keep him nothing by mouth with gentle hydration for now.  .   2. 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   3. DM type II. Hold Glucophage ISS.  CBG (last 3)   Recent Labs  09/24/14 1801 09/25/14 0012 09/25/14 0440  GLUCAP 117* 107* 105*     4. 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 Supportive care.   5. Hypokalemia and hypomagnesemia. Replaced IV  we'll monitor.    Code Status : Full  Family Communication  :  Wife  Disposition Plan  : TBD  Consults  :  Neuro, Onc  Procedures  :   EEG  DVT Prophylaxis  :  Lovenox    Lab Results  Component Value Date   PLT 378 09/25/2014    Inpatient Medications  Scheduled Meds: . enoxaparin (LOVENOX) injection  40 mg Subcutaneous QHS  . insulin aspart  0-9 Units Subcutaneous Q6H  . levETIRAcetam  1,500 mg Oral BID  . magnesium sulfate 1 - 4 g bolus IVPB  2 g Intravenous Once  . phenytoin (DILANTIN) IV  100 mg Intravenous 3 times per day  . potassium chloride  10 mEq Intravenous Q1 Hr x 6   Continuous Infusions: . sodium chloride 75 mL/hr (09/25/14 0344)   PRN Meds:.HYDROcodone-acetaminophen, LORazepam  Antibiotics  :    Anti-infectives    None        Objective:   Filed Vitals:   09/24/14 0648 09/24/14 1418 09/24/14 2134 09/25/14 0439  BP: 139/89 135/84 143/92 135/105  Pulse: 114 106 101 91  Temp: 98 F (36.7 C) 99.6 F (37.6 C) 98.9 F (37.2 C) 98.6 F (37 C)  TempSrc: Oral Oral Oral Oral  Resp: '18 18 18 18  '$ Height:      Weight:      SpO2: 100% 97% 99% 100%    Wt Readings from Last 3 Encounters:  09/23/14 69.4 kg (153 lb)  09/18/14 69.446 kg (153 lb 1.6 oz)  09/04/14 69.763 kg (153 lb 12.8 oz)     Intake/Output Summary (Last 24 hours) at 09/25/14 1059 Last data filed at 09/25/14 1011  Gross per 24 hour  Intake     60 ml  Output   1275 ml  Net  -1215 ml     Physical Exam  Awake but confused, No new F.N deficits,   [place].AT,PERRAL Supple Neck,No JVD, No cervical lymphadenopathy appriciated.  Symmetrical Chest wall movement, Good air movement bilaterally, CTAB RRR,No Gallops,Rubs or new Murmurs, No Parasternal Heave +ve B.Sounds, Abd Soft, No tenderness, No organomegaly appriciated, No rebound - guarding or rigidity. No Cyanosis, Clubbing or edema, No new Rash or bruise      Data Review:   Micro Results Recent Results (from the past 240 hour(s))  TECHNOLOGIST REVIEW     Status: None   Collection Time:  09/18/14  9:26 AM  Result Value Ref Range Status   Technologist Review Spherocytes present.  Final  MRSA PCR Screening     Status: None   Collection Time: 09/24/14  2:40 AM  Result Value Ref Range Status   MRSA by PCR NEGATIVE NEGATIVE Final    Comment:        The GeneXpert MRSA Assay (FDA approved for NASAL specimens only), is one component of a comprehensive MRSA colonization surveillance program. It is not intended to diagnose MRSA infection nor to guide or monitor treatment for MRSA infections.     Radiology Reports Ct Head Wo Contrast  09/23/2014   CLINICAL DATA:  Acute onset of seizure. Personal history of lung cancer. Initial encounter.  EXAM: CT HEAD WITHOUT CONTRAST  TECHNIQUE: Contiguous axial images were obtained from the base of the skull through the vertex without intravenous contrast.  COMPARISON:  None.  FINDINGS: There is no evidence of acute infarction, mass lesion, or intra- or extra-axial hemorrhage on CT.  The posterior fossa, including the cerebellum, brainstem and fourth ventricle, is within normal limits. The third and lateral ventricles, and basal ganglia are unremarkable in appearance. The cerebral hemispheres are symmetric in appearance, with normal gray-white differentiation. No mass effect or midline shift is seen.  There is no evidence of fracture; postoperative change is noted at the left parietal calvarium. The orbits are within normal limits. The paranasal sinuses and mastoid air cells are well-aerated. No significant soft tissue abnormalities are seen.  IMPRESSION: Unremarkable noncontrast CT of the head.   Electronically Signed   By: Garald Balding M.D.   On: 09/23/2014 22:53   Ct Chest W Contrast  09/02/2014   CLINICAL DATA:  Metastatic small cell lung cancer with ongoing chemotherapy and brain metastases. Liver metastases. Recurrent hemoptysis.  EXAM: CT CHEST, ABDOMEN, AND PELVIS WITH CONTRAST  TECHNIQUE: Multidetector CT imaging of the chest, abdomen and  pelvis was performed following the standard protocol during bolus administration of intravenous contrast.  CONTRAST:  118m OMNIPAQUE IOHEXOL 300 MG/ML  SOLN  COMPARISON:  07/30/2014.  FINDINGS: CT CHEST FINDINGS  Mediastinum/Nodes: Left subclavian Port-A-Cath terminates in the high right atrium. Bulky mediastinal adenopathy measures up to 1.9 x 4.0 cm in the lower right paratracheal station (previously 3.5 x 5.2 cm). Right hilar adenopathy measures 2.3 x 2.6 cm (previously 3.5 x 3.8 cm). Narrowing of the distal right upper lobe pulmonary artery, similar to the prior exam. No axillary adenopathy. Three-vessel coronary artery calcification. Heart size normal. No pericardial effusion.  Lungs/Pleura: Small right pleural effusion, decreased. A nodule in the  medial aspect of the anterior segment right upper lobe measures 2.0 x 2.6 cm (previously 2.6 x 3.5 cm). Progressive areas of rounded consolidation and ground-glass in the posterior segment right upper lobe. 4 mm nodule in the superior segment right lower lobe (series 4, image 28), likely stable. Left lung is clear. There is slight narrowing of the distal right upper lobe bronchus. Airway is otherwise unremarkable.  Musculoskeletal: No worrisome lytic or sclerotic lesions.  CT ABDOMEN AND PELVIS FINDINGS  Hepatobiliary: Ill-defined low-attenuation lesion in the the right hepatic lobe measures 2.8 cm, previously 3.9 cm. There may be a subtle peripherally hyper attenuating a lesion in the peripheral aspect of segment 4 of the liver, measuring 1.3 cm (series 2, image 52), possibly better seen on the current study due to different phase of contrast enhancement. 1.7 cm lesion in the caudate (series 2, image 55), decreased from 2.2 cm No new lesions. Gallbladder is unremarkable. No biliary ductal dilatation.  Pancreas: Negative.  Spleen: Negative.  Adrenals/Urinary Tract: Adrenal glands and kidneys are unremarkable. Ureters are decompressed. Bladder is low in volume.   Stomach/Bowel: Stomach, small bowel and appendix are unremarkable. Left lower quadrant colostomy. Colon and rectum are otherwise unremarkable.  Vascular/Lymphatic: Atherosclerotic calcification of the arterial vasculature without abdominal aortic aneurysm. Portacaval lymph node measures 2.0 cm in short axis (previously 2.6 cm). A lymph node anterior to the right renal vein measures 1.5 cm (image 63), previously 2.0 cm.  Reproductive: Prostate is visualized.  Other: No free fluid. Mesenteries and peritoneum are otherwise unremarkable.  Musculoskeletal: No worrisome lytic or sclerotic lesions. Compression deformities involving T11, L2 and L5 are unchanged.  IMPRESSION: 1. Interval response to therapy as evidenced by decrease in size of bulky mediastinal/right hilar adenopathy, right upper lobe nodule, small right pleural effusion and hepatic lesions. 2. Areas of rounded consolidation and surrounding ground-glass in the right upper lobe are progressive and may be due to pulmonary hemorrhage in this patient with a history of recurrent hemoptysis. 3. Slight improvement in adenopathy in the porta hepatis and adjacent right upper quadrant. 4. Three-vessel coronary artery calcification.   Electronically Signed   By: Lorin Picket M.D.   On: 09/02/2014 13:47   Ct Abdomen Pelvis W Contrast  09/02/2014   CLINICAL DATA:  Metastatic small cell lung cancer with ongoing chemotherapy and brain metastases. Liver metastases. Recurrent hemoptysis.  EXAM: CT CHEST, ABDOMEN, AND PELVIS WITH CONTRAST  TECHNIQUE: Multidetector CT imaging of the chest, abdomen and pelvis was performed following the standard protocol during bolus administration of intravenous contrast.  CONTRAST:  181m OMNIPAQUE IOHEXOL 300 MG/ML  SOLN  COMPARISON:  07/30/2014.  FINDINGS: CT CHEST FINDINGS  Mediastinum/Nodes: Left subclavian Port-A-Cath terminates in the high right atrium. Bulky mediastinal adenopathy measures up to 1.9 x 4.0 cm in the lower right  paratracheal station (previously 3.5 x 5.2 cm). Right hilar adenopathy measures 2.3 x 2.6 cm (previously 3.5 x 3.8 cm). Narrowing of the distal right upper lobe pulmonary artery, similar to the prior exam. No axillary adenopathy. Three-vessel coronary artery calcification. Heart size normal. No pericardial effusion.  Lungs/Pleura: Small right pleural effusion, decreased. A nodule in the medial aspect of the anterior segment right upper lobe measures 2.0 x 2.6 cm (previously 2.6 x 3.5 cm). Progressive areas of rounded consolidation and ground-glass in the posterior segment right upper lobe. 4 mm nodule in the superior segment right lower lobe (series 4, image 28), likely stable. Left lung is clear. There is slight narrowing of the distal  right upper lobe bronchus. Airway is otherwise unremarkable.  Musculoskeletal: No worrisome lytic or sclerotic lesions.  CT ABDOMEN AND PELVIS FINDINGS  Hepatobiliary: Ill-defined low-attenuation lesion in the the right hepatic lobe measures 2.8 cm, previously 3.9 cm. There may be a subtle peripherally hyper attenuating a lesion in the peripheral aspect of segment 4 of the liver, measuring 1.3 cm (series 2, image 52), possibly better seen on the current study due to different phase of contrast enhancement. 1.7 cm lesion in the caudate (series 2, image 55), decreased from 2.2 cm No new lesions. Gallbladder is unremarkable. No biliary ductal dilatation.  Pancreas: Negative.  Spleen: Negative.  Adrenals/Urinary Tract: Adrenal glands and kidneys are unremarkable. Ureters are decompressed. Bladder is low in volume.  Stomach/Bowel: Stomach, small bowel and appendix are unremarkable. Left lower quadrant colostomy. Colon and rectum are otherwise unremarkable.  Vascular/Lymphatic: Atherosclerotic calcification of the arterial vasculature without abdominal aortic aneurysm. Portacaval lymph node measures 2.0 cm in short axis (previously 2.6 cm). A lymph node anterior to the right renal vein  measures 1.5 cm (image 63), previously 2.0 cm.  Reproductive: Prostate is visualized.  Other: No free fluid. Mesenteries and peritoneum are otherwise unremarkable.  Musculoskeletal: No worrisome lytic or sclerotic lesions. Compression deformities involving T11, L2 and L5 are unchanged.  IMPRESSION: 1. Interval response to therapy as evidenced by decrease in size of bulky mediastinal/right hilar adenopathy, right upper lobe nodule, small right pleural effusion and hepatic lesions. 2. Areas of rounded consolidation and surrounding ground-glass in the right upper lobe are progressive and may be due to pulmonary hemorrhage in this patient with a history of recurrent hemoptysis. 3. Slight improvement in adenopathy in the porta hepatis and adjacent right upper quadrant. 4. Three-vessel coronary artery calcification.   Electronically Signed   By: Lorin Picket M.D.   On: 09/02/2014 13:47   Dg Chest Portable 1 View  09/24/2014   CLINICAL DATA:  Initial valuation for acute pneumonia, shortness of breath.  EXAM: PORTABLE CHEST - 1 VIEW  COMPARISON:  None available.  FINDINGS: Left-sided Port-A-Cath in place with tip overlying the proximal -mid right atrium. Heart size at the upper limits of normal. Mediastinal silhouette within normal limits. Tracheal air column midline and patent.  Lungs are hypoinflated. There is patchy and hazy left basilar opacities, which may reflect atelectasis and/ or infiltrate. There is additional subtle asymmetric opacity within the right upper lobe, which may also reflect developing infectious infiltrate. This finding may in part be related to patient rotation (patient is rotated to the right). No pulmonary edema or pleural effusion. No pneumothorax.  No acute osseus abnormality.  IMPRESSION: 1. Shallow lung inflation with patchy and hazy left basilar opacities. While these findings may in part reflect atelectasis, possible early/developing infiltrate could be considered in the correct clinical  setting. 2. Additional subtle asymmetric patchy opacity within the right upper lobe, also suspicious for possible developing infectious infiltrate. 3. No other active cardiopulmonary disease.   Electronically Signed   By: Jeannine Boga M.D.   On: 09/24/2014 00:18     CBC  Recent Labs Lab 09/23/14 2129 09/25/14 0613  WBC 11.2* 7.2  HGB 9.0* 8.8*  HCT 27.7* 27.3*  PLT 319 378  MCV 95.5 94.8  MCH 31.0 30.6  MCHC 32.5 32.2  RDW 18.3* 17.7*  LYMPHSABS 1.1  --   MONOABS 0.2  --   EOSABS 0.0  --   BASOSABS 0.0  --     Chemistries   Recent Labs Lab  09/23/14 2129 09/25/14 0613  NA 136 136  K 3.1* 2.9*  CL 99* 101  CO2 26 24  GLUCOSE 184* 91  BUN <5* <5*  CREATININE 0.52* 0.49*  CALCIUM 8.4* 8.5*  MG  --  1.3*  AST  --  54*  ALT  --  29  ALKPHOS  --  135*  BILITOT  --  0.7   ------------------------------------------------------------------------------------------------------------------ estimated creatinine clearance is 113.3 mL/min (by C-G formula based on Cr of 0.49). ------------------------------------------------------------------------------------------------------------------  Recent Labs  09/23/14 2129  HGBA1C 5.6   ------------------------------------------------------------------------------------------------------------------ No results for input(s): CHOL, HDL, LDLCALC, TRIG, CHOLHDL, LDLDIRECT in the last 72 hours. ------------------------------------------------------------------------------------------------------------------ No results for input(s): TSH, T4TOTAL, T3FREE, THYROIDAB in the last 72 hours.  Invalid input(s): FREET3 ------------------------------------------------------------------------------------------------------------------ No results for input(s): VITAMINB12, FOLATE, FERRITIN, TIBC, IRON, RETICCTPCT in the last 72 hours.  Coagulation profile No results for input(s): INR, PROTIME in the last 168 hours.  No results for  input(s): DDIMER in the last 72 hours.  Cardiac Enzymes No results for input(s): CKMB, TROPONINI, MYOGLOBIN in the last 168 hours.  Invalid input(s): CK ------------------------------------------------------------------------------------------------------------------ Invalid input(s): POCBNP   Time Spent in minutes  35   Apryll Hinkle K M.D on 09/25/2014 at 10:59 AM  Between 7am to 7pm - Pager - [phone]  After 7pm go to [email or web address] - password [facility]  [facility] -  Office  [phone]

## 2014-09-25 NOTE — Progress Notes (Signed)
LTM EEG complete. No skin breakdown with unhook. No event push buttons overnight/ results pending

## 2014-09-25 NOTE — Progress Notes (Signed)
Subjective: patient is very agitated. Speech more coherent today.   Objective: Current vital signs: BP 135/105 mmHg  Pulse 91  Temp(Src) 98.6 F (37 C) (Oral)  Resp 18  Ht '5\' 10"'$  (1.778 m)  Wt 69.4 kg (153 lb)  BMI 21.95 kg/m2  SpO2 100% Vital signs in last 24 hours: Temp:  [98.6 F (37 C)-99.6 F (37.6 C)] 98.6 F (37 C) (08/10 0439) Pulse Rate:  [91-106] 91 (08/10 0439) Resp:  [18] 18 (08/10 0439) BP: (135-143)/(84-105) 135/105 mmHg (08/10 0439) SpO2:  [97 %-100 %] 100 % (08/10 0439)  Intake/Output from previous day: 08/09 0701 - 08/10 0700 In: 120 [P.O.:120] Out: 1075 [Urine:1075] Intake/Output this shift:   Nutritional status: Diet NPO time specified Except for: Sips with Meds  Neurologic Exam: General: Mental Status: Alert, agitated stating he "people do not know what they are doing here", "I am going to fire the nurse". At thime his speech is not coherent but still much improved since yesterday.  Cranial Nerves: II: blinks to threat bilaterally, pupils equal, round, reactive to light and accommodation III,IV, VI: ptosis not present, extra-ocular motions intact bilaterally V,VII: face symmetric, facial light touch sensation normal bilaterally VIII: hearing normal bilaterally XI: bilateral shoulder shrug XII: midline tongue extension without atrophy or fasciculations  Motor: Moving all extremities with good strength.  Due to agitation and attempting to hit practitioner-- DTR, Sensory was not able to be obtained.     Lab Results: Basic Metabolic Panel:  Recent Labs Lab 09/18/14 0926 09/23/14 2129 09/25/14 0613  NA 137 136 136  K 3.2* 3.1* 2.9*  CL  --  99* 101  CO2 '28 26 24  '$ GLUCOSE 140 184* 91  BUN 7.4 <5* <5*  CREATININE 0.7 0.52* 0.49*  CALCIUM 9.2 8.4* 8.5*  MG  --   --  1.3*    Liver Function Tests:  Recent Labs Lab 09/18/14 0926 09/24/14 1350 09/25/14 0613  AST 64*  --  54*  ALT 36  --  29  ALKPHOS 195*  --  135*  BILITOT 0.55  --   0.7  PROT 6.6  --  5.9*  ALBUMIN 3.3* 2.9* 2.9*   No results for input(s): LIPASE, AMYLASE in the last 168 hours. No results for input(s): AMMONIA in the last 168 hours.  CBC:  Recent Labs Lab 09/18/14 0926 09/23/14 2129 09/25/14 0613  WBC 10.9* 11.2* 7.2  NEUTROABS 8.6* 9.9*  --   HGB 8.7* 9.0* 8.8*  HCT 25.4* 27.7* 27.3*  MCV 92.0 95.5 94.8  PLT 72 Platelet count confirmed by slide estimate* 319 378    Cardiac Enzymes: No results for input(s): CKTOTAL, CKMB, CKMBINDEX, TROPONINI in the last 168 hours.  Lipid Panel: No results for input(s): CHOL, TRIG, HDL, CHOLHDL, VLDL, LDLCALC in the last 168 hours.  CBG:  Recent Labs Lab 09/23/14 2124 09/24/14 1334 09/24/14 1801 09/25/14 0012 09/25/14 0440  GLUCAP 187* 111* 117* 107* 105*    Microbiology: Results for orders placed or performed during the hospital encounter of 09/23/14  MRSA PCR Screening     Status: None   Collection Time: 09/24/14  2:40 AM  Result Value Ref Range Status   MRSA by PCR NEGATIVE NEGATIVE Final    Comment:        The GeneXpert MRSA Assay (FDA approved for NASAL specimens only), is one component of a comprehensive MRSA colonization surveillance program. It is not intended to diagnose MRSA infection nor to guide or monitor treatment for MRSA infections.  Coagulation Studies: No results for input(s): LABPROT, INR in the last 72 hours.  Imaging: Ct Head Wo Contrast  09/23/2014   CLINICAL DATA:  Acute onset of seizure. Personal history of lung cancer. Initial encounter.  EXAM: CT HEAD WITHOUT CONTRAST  TECHNIQUE: Contiguous axial images were obtained from the base of the skull through the vertex without intravenous contrast.  COMPARISON:  None.  FINDINGS: There is no evidence of acute infarction, mass lesion, or intra- or extra-axial hemorrhage on CT.  The posterior fossa, including the cerebellum, brainstem and fourth ventricle, is within normal limits. The third and lateral  ventricles, and basal ganglia are unremarkable in appearance. The cerebral hemispheres are symmetric in appearance, with normal gray-white differentiation. No mass effect or midline shift is seen.  There is no evidence of fracture; postoperative change is noted at the left parietal calvarium. The orbits are within normal limits. The paranasal sinuses and mastoid air cells are well-aerated. No significant soft tissue abnormalities are seen.  IMPRESSION: Unremarkable noncontrast CT of the head.   Electronically Signed   By: Garald Balding M.D.   On: 09/23/2014 22:53   Dg Chest Portable 1 View  09/24/2014   CLINICAL DATA:  Initial valuation for acute pneumonia, shortness of breath.  EXAM: PORTABLE CHEST - 1 VIEW  COMPARISON:  None available.  FINDINGS: Left-sided Port-A-Cath in place with tip overlying the proximal -mid right atrium. Heart size at the upper limits of normal. Mediastinal silhouette within normal limits. Tracheal air column midline and patent.  Lungs are hypoinflated. There is patchy and hazy left basilar opacities, which may reflect atelectasis and/ or infiltrate. There is additional subtle asymmetric opacity within the right upper lobe, which may also reflect developing infectious infiltrate. This finding may in part be related to patient rotation (patient is rotated to the right). No pulmonary edema or pleural effusion. No pneumothorax.  No acute osseus abnormality.  IMPRESSION: 1. Shallow lung inflation with patchy and hazy left basilar opacities. While these findings may in part reflect atelectasis, possible early/developing infiltrate could be considered in the correct clinical setting. 2. Additional subtle asymmetric patchy opacity within the right upper lobe, also suspicious for possible developing infectious infiltrate. 3. No other active cardiopulmonary disease.   Electronically Signed   By: Jeannine Boga M.D.   On: 09/24/2014 00:18    Medications:  Scheduled: . enoxaparin  (LOVENOX) injection  40 mg Subcutaneous QHS  . insulin aspart  0-9 Units Subcutaneous Q6H  . levETIRAcetam  1,000 mg Oral BID  . phenytoin (DILANTIN) IV  100 mg Intravenous 3 times per day    Assessment/Plan: Currently hooked up to LTM and showing no active seizure.   Recommend: 1) Increase Keppra 1500 mg twice a day, and continue Dilantin per pharmacy management.  2) MRI brain W/WO contrast when able 3) Pharmacy consulted to follow Dilantin.    Savva Beamer PA-C [facility] [phone]  09/25/2014, 9:13 AM   I personally participated in this patient's evaluation and management, including formulating the above clinical impression and management recommendations.  Rush Farmer M.D. [facility] [phone]

## 2014-09-25 NOTE — Progress Notes (Signed)
Notified Dr Hilbert Bible or Bp of 135/105 when I discovered it around 05:40 and asked if they wanted to address it.  Have not received reply or order as of yet.

## 2014-09-25 NOTE — Progress Notes (Signed)
Initial Nutrition Assessment  DOCUMENTATION CODES:   Not applicable  INTERVENTION:   -RD will follow for diet advancement and supplement diet as appropriate  NUTRITION DIAGNOSIS:   Inadequate oral intake related to inability to eat as evidenced by NPO status.  GOAL:   Patient will meet greater than or equal to 90% of their needs  MONITOR:   PO intake, Supplement acceptance, Diet advancement, Labs, Weight trends, Skin, I & O's  REASON FOR ASSESSMENT:   Malnutrition Screening Tool    ASSESSMENT:   Patient is a 44 year old male with history of SCLC diagnosed in 2016 s/p chemotherapy in June 8832, complicated by diverticulitis and peritonitis s/p surgery s/p sigmoid colectomy and L.colostomy who was brought here with cc of seizure. 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 Family reported today he has not been making sense and they called EMS. En route he had two seizures that were responsive to diazepam IV. He remains to be post ictal in the ER, confused and unable to contribute to history. There is no family members at bedside. CT head done in the ER and was unremarkable. He was loaded with Dilantin and neurology was consulted.   Pt admitted with recurrent seizures.   Pt somnolent at time of visit. Staff report pt is aphasic and agitated. Nutrition-focused physical exam deferred at this time.  Pt unable to participate in interview and no family present at time of visit.  Pt with hx of small cell lung cancer, who has already undergone 2 cycles of chemotherapy. 3rd cycle has been delayed due to pancytopenia. Oncology currently following.   Wt hx reviewed. Pt UBW 180#. Noted a 21# (12%) wt loss over the past 3 months. Also suspect poor po intake PTA.   Reviewed [facility] note on 09/24/14. Pt with LLQ colostomy (noted 200 ml output since admission) as well as a chronic, nonhealing abdominal surgical wound.   S/p EEG; awaiting  neurology interpretation.   Labs reviewed: K: 2.9, Mg: 1.3 (on IV supplementation).   Diet Order:  Diet NPO time specified Except for: Sips with Meds  Skin:  Wound (see comment) (closed abdominal incision)  Last BM:  09/24/14  Height:   Ht Readings from Last 1 Encounters:  09/23/14 '5\' 10"'$  (1.778 m)    Weight:   Wt Readings from Last 1 Encounters:  09/23/14 153 lb (69.4 kg)    Ideal Body Weight:  75.5 kg  BMI:  Body mass index is 21.95 kg/(m^2).  Estimated Nutritional Needs:   Kcal:  2200-2400  Protein:  105-120 grams  Fluid:  2.2-2.4 L  EDUCATION NEEDS:   No education needs identified at this time  [name] A. Jimmye [name], RD, LDN, CDE Pager: [phone] After hours Pager: [phone]

## 2014-09-26 ENCOUNTER — Ambulatory Visit: Payer: Self-pay

## 2014-09-26 ENCOUNTER — Inpatient Hospital Stay (HOSPITAL_COMMUNITY): Payer: Medicaid Other

## 2014-09-26 DIAGNOSIS — R4701 Aphasia: Secondary | ICD-10-CM

## 2014-09-26 DIAGNOSIS — C799 Secondary malignant neoplasm of unspecified site: Secondary | ICD-10-CM

## 2014-09-26 LAB — CBC
HEMATOCRIT: 25.8 % — AB (ref 39.0–52.0)
Hemoglobin: 8.6 g/dL — ABNORMAL LOW (ref 13.0–17.0)
MCH: 31.3 pg (ref 26.0–34.0)
MCHC: 33.3 g/dL (ref 30.0–36.0)
MCV: 93.8 fL (ref 78.0–100.0)
Platelets: 382 10*3/uL (ref 150–400)
RBC: 2.75 MIL/uL — ABNORMAL LOW (ref 4.22–5.81)
RDW: 17.6 % — ABNORMAL HIGH (ref 11.5–15.5)
WBC: 7.2 10*3/uL (ref 4.0–10.5)

## 2014-09-26 LAB — MAGNESIUM: Magnesium: 1.7 mg/dL (ref 1.7–2.4)

## 2014-09-26 LAB — GLUCOSE, CAPILLARY
GLUCOSE-CAPILLARY: 83 mg/dL (ref 65–99)
GLUCOSE-CAPILLARY: 91 mg/dL (ref 65–99)
GLUCOSE-CAPILLARY: 111 mg/dL — AB (ref 65–99)
GLUCOSE-CAPILLARY: 108 mg/dL — AB (ref 65–99)
Glucose-Capillary: 130 mg/dL — ABNORMAL HIGH (ref 65–99)

## 2014-09-26 LAB — BASIC METABOLIC PANEL
Anion gap: 10 (ref 5–15)
CHLORIDE: 103 mmol/L (ref 101–111)
CO2: 22 mmol/L (ref 22–32)
CREATININE: 0.52 mg/dL — AB (ref 0.61–1.24)
Calcium: 8.6 mg/dL — ABNORMAL LOW (ref 8.9–10.3)
GFR calc Af Amer: >60 mL/min (ref >60)
GFR calc non Af Amer: >60 mL/min (ref >60)
GLUCOSE: 89 mg/dL (ref 65–99)
POTASSIUM: 2.8 mmol/L — AB (ref 3.5–5.1)
Sodium: 135 mmol/L (ref 135–145)

## 2014-09-26 MED ORDER — POTASSIUM CHLORIDE 20 MEQ/15ML (10%) PO SOLN
40.0000 meq | Freq: Once | ORAL | Status: AC
  Administered 2014-09-26: 40 meq via ORAL
  Filled 2014-09-26: qty 30

## 2014-09-26 MED ORDER — GADOBENATE DIMEGLUMINE 529 MG/ML IV SOLN
15.0000 mL | Freq: Once | INTRAVENOUS | Status: AC | PRN
  Administered 2014-09-26: 15 mL via INTRAVENOUS

## 2014-09-26 MED ORDER — POTASSIUM CHLORIDE 10 MEQ/100ML IV SOLN
10.0000 meq | INTRAVENOUS | Status: DC
  Administered 2014-09-26 (×5): 10 meq via INTRAVENOUS
  Filled 2014-09-26 (×2): qty 100

## 2014-09-26 MED ORDER — ENSURE ENLIVE PO LIQD
237.0000 mL | Freq: Two times a day (BID) | ORAL | Status: DC
  Administered 2014-09-27 (×2): 237 mL via ORAL

## 2014-09-26 MED ORDER — POTASSIUM CHLORIDE 10 MEQ/100ML IV SOLN
10.0000 meq | Freq: Once | INTRAVENOUS | Status: AC
  Administered 2014-09-26: 10 meq via INTRAVENOUS
  Filled 2014-09-26: qty 100

## 2014-09-26 MED ORDER — MAGNESIUM SULFATE 2 GM/50ML IV SOLN
2.0000 g | Freq: Once | INTRAVENOUS | Status: AC
  Administered 2014-09-26: 2 g via INTRAVENOUS
  Filled 2014-09-26: qty 50

## 2014-09-26 MED ORDER — SODIUM CHLORIDE 0.9 % IV SOLN
INTRAVENOUS | Status: AC
  Administered 2014-09-26 – 2014-09-27 (×2): via INTRAVENOUS

## 2014-09-26 MED ORDER — INSULIN ASPART 100 UNIT/ML ~~LOC~~ SOLN: 0.0000 [IU] | Freq: Every day | SUBCUTANEOUS | Status: DC

## 2014-09-26 MED ORDER — POTASSIUM CHLORIDE CRYS ER 20 MEQ PO TBCR
40.0000 meq | EXTENDED_RELEASE_TABLET | Freq: Once | ORAL | Status: DC
  Filled 2014-09-26: qty 2

## 2014-09-26 MED ORDER — INSULIN ASPART 100 UNIT/ML ~~LOC~~ SOLN: 0.0000 [IU] | Freq: Three times a day (TID) | SUBCUTANEOUS | Status: DC

## 2014-09-26 MED ORDER — POTASSIUM CHLORIDE 20 MEQ PO PACK: 40.0000 meq | PACK | Freq: Once | ORAL | Status: DC

## 2014-09-26 NOTE — Progress Notes (Signed)
Utilization review completed. Esthela Brandner, RN, BSN. 

## 2014-09-26 NOTE — Progress Notes (Signed)
Brief Nutrition Follow-Up Note  Chart reviewed. SLP evaluated earlier this AM; pt has been advanced to a regular diet with thin liquids.   RD will add Ensure Enlive po BID, each supplement provides 350 kcal and 20 grams of protein to optimize nutritional status.   Will continue to follow.   Renu Asby A. Jimmye [name], RD, LDN, CDE Pager: [phone] After hours Pager: [phone]

## 2014-09-26 NOTE — Progress Notes (Signed)
Subjective: Patient is now improved and speaking in coherent sentences but shows a dense receptive aphasia.   Objective: Current vital signs: BP 121/85 mmHg  Pulse 98  Temp(Src) 97.8 F (36.6 C) (Oral)  Resp 20  Ht '5\' 10"'$  (1.778 m)  Wt 69.4 kg (153 lb)  BMI 21.95 kg/m2  SpO2 99% Vital signs in last 24 hours: Temp:  [97.8 F (36.6 C)-98.4 F (36.9 C)] 97.8 F (36.6 C) (08/11 0600) Pulse Rate:  [98-101] 98 (08/11 0600) Resp:  [16-20] 20 (08/11 0600) BP: (121-138)/(74-85) 121/85 mmHg (08/11 0600) SpO2:  [95 %-99 %] 99 % (08/11 0600)  Intake/Output from previous day: 08/10 0701 - 08/11 0700 In: 0  Out: 1125 [Urine:1125] Intake/Output this shift:   Nutritional status: Diet regular Room service appropriate?: Yes with Assist; Fluid consistency:: Thin  Neurologic Exam: General: Mental Status: Alert, not oriented to place, year, month--continues to repeat " I want to go home" and "sheets need to be cleaned".  Speech fluent with evidence of receptive aphasia.  Unable to follow commands due to aphasia. Cranial Nerves: II:  Visual fields grossly normal, pupils equal, round, reactive to light and accommodation III,IV, VI: ptosis not present, extra-ocular motions intact bilaterally V,VII: smile symmetric, facial light touch sensation normal bilaterally VIII: hearing normal bilaterally IX,X: uvula rises symmetrically XI: bilateral shoulder shrug XII: midline tongue extension without atrophy or fasciculations  Motor: Moving all extremities antigravity Sensory: Pinprick and light touch intact throughout, bilaterally Deep Tendon Reflexes:  Right: Upper Extremity   Left: Upper extremity   biceps (C-5 to C-6) 2/4   biceps (C-5 to C-6) 2/4 tricep (C7) 2/4    triceps (C7) 2/4 Brachioradialis (C6) 2/4  Brachioradialis (C6) 2/4  Lower Extremity Lower Extremity  quadriceps (L-2 to L-4) 2/4   quadriceps (L-2 to L-4) 2/4 Achilles (S1) 2/4   Achilles (S1) 2/4      Lab  Results: Basic Metabolic Panel:  Recent Labs Lab 09/23/14 2129 09/25/14 0613 09/26/14 0410  NA 136 136 135  K 3.1* 2.9* 2.8*  CL 99* 101 103  CO2 '26 24 22  '$ GLUCOSE 184* 91 89  BUN <5* <5* <5*  CREATININE 0.52* 0.49* 0.52*  CALCIUM 8.4* 8.5* 8.6*  MG  --  1.3* 1.7    Liver Function Tests:  Recent Labs Lab 09/24/14 1350 09/25/14 0613  AST  --  54*  ALT  --  29  ALKPHOS  --  135*  BILITOT  --  0.7  PROT  --  5.9*  ALBUMIN 2.9* 2.9*   No results for input(s): LIPASE, AMYLASE in the last 168 hours. No results for input(s): AMMONIA in the last 168 hours.  CBC:  Recent Labs Lab 09/23/14 2129 09/25/14 0613 09/26/14 0410  WBC 11.2* 7.2 7.2  NEUTROABS 9.9*  --   --   HGB 9.0* 8.8* 8.6*  HCT 27.7* 27.3* 25.8*  MCV 95.5 94.8 93.8  PLT 319 378 382    Cardiac Enzymes: No results for input(s): CKTOTAL, CKMB, CKMBINDEX, TROPONINI in the last 168 hours.  Lipid Panel: No results for input(s): CHOL, TRIG, HDL, CHOLHDL, VLDL, LDLCALC in the last 168 hours.  CBG:  Recent Labs Lab 09/25/14 0440 09/25/14 1148 09/25/14 1851 09/26/14 0515 09/26/14 0757  GLUCAP 105* 104* 112* 44 91    Microbiology: Results for orders placed or performed during the hospital encounter of 09/23/14  MRSA PCR Screening     Status: None   Collection Time: 09/24/14  2:40 AM  Result Value  Ref Range Status   MRSA by PCR NEGATIVE NEGATIVE Final    Comment:        The GeneXpert MRSA Assay (FDA approved for NASAL specimens only), is one component of a comprehensive MRSA colonization surveillance program. It is not intended to diagnose MRSA infection nor to guide or monitor treatment for MRSA infections.     Coagulation Studies: No results for input(s): LABPROT, INR in the last 72 hours.  Imaging: No results found.  Medications:  Scheduled: . enoxaparin (LOVENOX) injection  40 mg Subcutaneous QHS  . insulin aspart  0-9 Units Subcutaneous Q6H  . levETIRAcetam  1,500 mg Oral  BID  . magnesium sulfate 1 - 4 g bolus IVPB  2 g Intravenous Once  . phenytoin (DILANTIN) IV  100 mg Intravenous 3 times per day  . potassium chloride  10 mEq Intravenous Q1 Hr x 6  . potassium chloride  40 mEq Oral Once  . sodium chloride  10-40 mL Intracatheter Q12H    Assessment/Plan:  No further seizure activity noted but now showing receptive aphasia. At this time no MRI has been obtained to evaluate for possible recurrence of Metastasis.    Recommend: 1) continue current AED regime 2) MRI brain with and without contrast.    Etta Quill PA-C [facility] [phone]  09/26/2014, 9:10 AM   MRI: 09/26/2014 IMPRESSION: 1. Confluent T2 hyperintensity and restricted diffusion in the left hippocampal formation compatible with sequelae of seizure/status epilepticus. No associated hemorrhage or enhancement. 2. Further regression of 2 small brain metastases status post whole brain radiation earlier this year. 3. No other acute intracranial abnormality.  I personally participated in this patient's evaluation and management. Repeat MRI study showed no signs of recurrence or exacerbation of metastasis of lung cancer to the brain. Changes seen on MRI were likely due to postictal phenomena.  Recommend no changes in current management. We will continue to follow this patient with you.  Rush Farmer M.D. [facility] [phone]

## 2014-09-26 NOTE — Progress Notes (Signed)
Patient Demographics:    [name], is a 44 y.o. male, DOB - 03-05-1970, PQZ:[id number]  Admit date - 09/23/2014   Admitting Physician Gennaro Africa, MD  Outpatient Primary MD for the patient is Arnoldo Morale, MD  LOS - 3   Chief Complaint  Patient presents with  . Seizures        Subjective:    [name] today has, No headache, No chest pain, No abdominal pain - No Nausea, No new weakness tingling or numbness, No Cough - SOB.     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 Had history of seizures in the past and was on Keppra at home. Neurology on board, EEG confirms seizures, currently on Keppra and Dilantin which will be continued, IV Ativan for breakthrough seizure.   Discussed his case with neurologist Dr. Nicole Kindred on 09/25/2014, Keppra dose has been increased, on 09/26/2014 he finally show some improvement with improving mentation, seizure activity likely has ceased, repeat MRI requested by neurology. Has passed swallow eval by speech. We will monitor closely with increased activity and follow MRI.   2. 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   3. DM type II. Hold Glucophage ISS.  CBG (last 3)   Recent Labs  09/25/14 1851 09/26/14 0515 09/26/14 0757  GLUCAP 112* 83 91     4. 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 Supportive care.   5. Hypokalemia and hypomagnesemia. Replaced IV  we'll  monitor.    Code Status : Full  Family Communication  : Wife  Disposition Plan  : TBD  Consults  :  Neuro, Onc  Procedures  :   EEG +ve sizures  MRI Brain  -   DVT Prophylaxis  :  Lovenox    Lab Results  Component Value Date   PLT 382 09/26/2014    Inpatient Medications  Scheduled Meds: . enoxaparin (LOVENOX) injection  40 mg Subcutaneous QHS  . insulin aspart  0-9 Units Subcutaneous Q6H  . levETIRAcetam  1,500 mg Oral BID  . phenytoin (DILANTIN) IV  100 mg Intravenous 3 times per day  . potassium chloride  10 mEq Intravenous Q1 Hr x 6  . potassium chloride  40 mEq Oral Once  . sodium chloride  10-40 mL Intracatheter Q12H   Continuous Infusions:   PRN Meds:.HYDROcodone-acetaminophen, LORazepam, metoprolol  Antibiotics  :    Anti-infectives    None        Objective:   Filed Vitals:   09/25/14 1230 09/25/14 1425 09/25/14 2217 09/26/14 0600  BP: 125/74 138/84 124/83 121/85  Pulse: 98 98 101 98  Temp: 97.9 F (36.6 C) 98 F (36.7 C) 98.4 F (36.9 C) 97.8 F (36.6 C)  TempSrc: Oral Oral Oral Oral  Resp: '16 20 20 20  '$ Height:      Weight:      SpO2: 98% 95% 99% 99%    Wt  Readings from Last 3 Encounters:  09/23/14 69.4 kg (153 lb)  09/18/14 69.446 kg (153 lb 1.6 oz)  09/04/14 69.763 kg (153 lb 12.8 oz)     Intake/Output Summary (Last 24 hours) at 09/26/14 1120 Last data filed at 09/26/14 0500  Gross per 24 hour  Intake      0 ml  Output    925 ml  Net   -925 ml     Physical Exam  Awake but confused, No new F.N deficits,   [place].AT,PERRAL Supple Neck,No JVD, No cervical lymphadenopathy appriciated.  Symmetrical Chest wall movement, Good air movement bilaterally, CTAB RRR,No Gallops,Rubs or new Murmurs, No Parasternal Heave +ve B.Sounds, Abd Soft, No tenderness, No organomegaly appriciated, No rebound - guarding or rigidity. No Cyanosis, Clubbing or edema, No new Rash or bruise      Data Review:   Micro Results Recent Results (from the  past 240 hour(s))  TECHNOLOGIST REVIEW     Status: None   Collection Time: 09/18/14  9:26 AM  Result Value Ref Range Status   Technologist Review Spherocytes present.  Final  MRSA PCR Screening     Status: None   Collection Time: 09/24/14  2:40 AM  Result Value Ref Range Status   MRSA by PCR NEGATIVE NEGATIVE Final    Comment:        The GeneXpert MRSA Assay (FDA approved for NASAL specimens only), is one component of a comprehensive MRSA colonization surveillance program. It is not intended to diagnose MRSA infection nor to guide or monitor treatment for MRSA infections.     Radiology Reports Ct Head Wo Contrast  09/23/2014   CLINICAL DATA:  Acute onset of seizure. Personal history of lung cancer. Initial encounter.  EXAM: CT HEAD WITHOUT CONTRAST  TECHNIQUE: Contiguous axial images were obtained from the base of the skull through the vertex without intravenous contrast.  COMPARISON:  None.  FINDINGS: There is no evidence of acute infarction, mass lesion, or intra- or extra-axial hemorrhage on CT.  The posterior fossa, including the cerebellum, brainstem and fourth ventricle, is within normal limits. The third and lateral ventricles, and basal ganglia are unremarkable in appearance. The cerebral hemispheres are symmetric in appearance, with normal gray-white differentiation. No mass effect or midline shift is seen.  There is no evidence of fracture; postoperative change is noted at the left parietal calvarium. The orbits are within normal limits. The paranasal sinuses and mastoid air cells are well-aerated. No significant soft tissue abnormalities are seen.  IMPRESSION: Unremarkable noncontrast CT of the head.   Electronically Signed   By: Garald Balding M.D.   On: 09/23/2014 22:53   Ct Chest W Contrast  09/02/2014   CLINICAL DATA:  Metastatic small cell lung cancer with ongoing chemotherapy and brain metastases. Liver metastases. Recurrent hemoptysis.  EXAM: CT CHEST, ABDOMEN, AND PELVIS  WITH CONTRAST  TECHNIQUE: Multidetector CT imaging of the chest, abdomen and pelvis was performed following the standard protocol during bolus administration of intravenous contrast.  CONTRAST:  118m OMNIPAQUE IOHEXOL 300 MG/ML  SOLN  COMPARISON:  07/30/2014.  FINDINGS: CT CHEST FINDINGS  Mediastinum/Nodes: Left subclavian Port-A-Cath terminates in the high right atrium. Bulky mediastinal adenopathy measures up to 1.9 x 4.0 cm in the lower right paratracheal station (previously 3.5 x 5.2 cm). Right hilar adenopathy measures 2.3 x 2.6 cm (previously 3.5 x 3.8 cm). Narrowing of the distal right upper lobe pulmonary artery, similar to the prior exam. No axillary adenopathy. Three-vessel coronary artery calcification. Heart size  normal. No pericardial effusion.  Lungs/Pleura: Small right pleural effusion, decreased. A nodule in the medial aspect of the anterior segment right upper lobe measures 2.0 x 2.6 cm (previously 2.6 x 3.5 cm). Progressive areas of rounded consolidation and ground-glass in the posterior segment right upper lobe. 4 mm nodule in the superior segment right lower lobe (series 4, image 28), likely stable. Left lung is clear. There is slight narrowing of the distal right upper lobe bronchus. Airway is otherwise unremarkable.  Musculoskeletal: No worrisome lytic or sclerotic lesions.  CT ABDOMEN AND PELVIS FINDINGS  Hepatobiliary: Ill-defined low-attenuation lesion in the the right hepatic lobe measures 2.8 cm, previously 3.9 cm. There may be a subtle peripherally hyper attenuating a lesion in the peripheral aspect of segment 4 of the liver, measuring 1.3 cm (series 2, image 52), possibly better seen on the current study due to different phase of contrast enhancement. 1.7 cm lesion in the caudate (series 2, image 55), decreased from 2.2 cm No new lesions. Gallbladder is unremarkable. No biliary ductal dilatation.  Pancreas: Negative.  Spleen: Negative.  Adrenals/Urinary Tract: Adrenal glands and kidneys  are unremarkable. Ureters are decompressed. Bladder is low in volume.  Stomach/Bowel: Stomach, small bowel and appendix are unremarkable. Left lower quadrant colostomy. Colon and rectum are otherwise unremarkable.  Vascular/Lymphatic: Atherosclerotic calcification of the arterial vasculature without abdominal aortic aneurysm. Portacaval lymph node measures 2.0 cm in short axis (previously 2.6 cm). A lymph node anterior to the right renal vein measures 1.5 cm (image 63), previously 2.0 cm.  Reproductive: Prostate is visualized.  Other: No free fluid. Mesenteries and peritoneum are otherwise unremarkable.  Musculoskeletal: No worrisome lytic or sclerotic lesions. Compression deformities involving T11, L2 and L5 are unchanged.  IMPRESSION: 1. Interval response to therapy as evidenced by decrease in size of bulky mediastinal/right hilar adenopathy, right upper lobe nodule, small right pleural effusion and hepatic lesions. 2. Areas of rounded consolidation and surrounding ground-glass in the right upper lobe are progressive and may be due to pulmonary hemorrhage in this patient with a history of recurrent hemoptysis. 3. Slight improvement in adenopathy in the porta hepatis and adjacent right upper quadrant. 4. Three-vessel coronary artery calcification.   Electronically Signed   By: Lorin Picket M.D.   On: 09/02/2014 13:47   Ct Abdomen Pelvis W Contrast  09/02/2014   CLINICAL DATA:  Metastatic small cell lung cancer with ongoing chemotherapy and brain metastases. Liver metastases. Recurrent hemoptysis.  EXAM: CT CHEST, ABDOMEN, AND PELVIS WITH CONTRAST  TECHNIQUE: Multidetector CT imaging of the chest, abdomen and pelvis was performed following the standard protocol during bolus administration of intravenous contrast.  CONTRAST:  141m OMNIPAQUE IOHEXOL 300 MG/ML  SOLN  COMPARISON:  07/30/2014.  FINDINGS: CT CHEST FINDINGS  Mediastinum/Nodes: Left subclavian Port-A-Cath terminates in the high right atrium. Bulky  mediastinal adenopathy measures up to 1.9 x 4.0 cm in the lower right paratracheal station (previously 3.5 x 5.2 cm). Right hilar adenopathy measures 2.3 x 2.6 cm (previously 3.5 x 3.8 cm). Narrowing of the distal right upper lobe pulmonary artery, similar to the prior exam. No axillary adenopathy. Three-vessel coronary artery calcification. Heart size normal. No pericardial effusion.  Lungs/Pleura: Small right pleural effusion, decreased. A nodule in the medial aspect of the anterior segment right upper lobe measures 2.0 x 2.6 cm (previously 2.6 x 3.5 cm). Progressive areas of rounded consolidation and ground-glass in the posterior segment right upper lobe. 4 mm nodule in the superior segment right lower lobe (series 4,  image 28), likely stable. Left lung is clear. There is slight narrowing of the distal right upper lobe bronchus. Airway is otherwise unremarkable.  Musculoskeletal: No worrisome lytic or sclerotic lesions.  CT ABDOMEN AND PELVIS FINDINGS  Hepatobiliary: Ill-defined low-attenuation lesion in the the right hepatic lobe measures 2.8 cm, previously 3.9 cm. There may be a subtle peripherally hyper attenuating a lesion in the peripheral aspect of segment 4 of the liver, measuring 1.3 cm (series 2, image 52), possibly better seen on the current study due to different phase of contrast enhancement. 1.7 cm lesion in the caudate (series 2, image 55), decreased from 2.2 cm No new lesions. Gallbladder is unremarkable. No biliary ductal dilatation.  Pancreas: Negative.  Spleen: Negative.  Adrenals/Urinary Tract: Adrenal glands and kidneys are unremarkable. Ureters are decompressed. Bladder is low in volume.  Stomach/Bowel: Stomach, small bowel and appendix are unremarkable. Left lower quadrant colostomy. Colon and rectum are otherwise unremarkable.  Vascular/Lymphatic: Atherosclerotic calcification of the arterial vasculature without abdominal aortic aneurysm. Portacaval lymph node measures 2.0 cm in short axis  (previously 2.6 cm). A lymph node anterior to the right renal vein measures 1.5 cm (image 63), previously 2.0 cm.  Reproductive: Prostate is visualized.  Other: No free fluid. Mesenteries and peritoneum are otherwise unremarkable.  Musculoskeletal: No worrisome lytic or sclerotic lesions. Compression deformities involving T11, L2 and L5 are unchanged.  IMPRESSION: 1. Interval response to therapy as evidenced by decrease in size of bulky mediastinal/right hilar adenopathy, right upper lobe nodule, small right pleural effusion and hepatic lesions. 2. Areas of rounded consolidation and surrounding ground-glass in the right upper lobe are progressive and may be due to pulmonary hemorrhage in this patient with a history of recurrent hemoptysis. 3. Slight improvement in adenopathy in the porta hepatis and adjacent right upper quadrant. 4. Three-vessel coronary artery calcification.   Electronically Signed   By: Lorin Picket M.D.   On: 09/02/2014 13:47   Dg Chest Portable 1 View  09/24/2014   CLINICAL DATA:  Initial valuation for acute pneumonia, shortness of breath.  EXAM: PORTABLE CHEST - 1 VIEW  COMPARISON:  None available.  FINDINGS: Left-sided Port-A-Cath in place with tip overlying the proximal -mid right atrium. Heart size at the upper limits of normal. Mediastinal silhouette within normal limits. Tracheal air column midline and patent.  Lungs are hypoinflated. There is patchy and hazy left basilar opacities, which may reflect atelectasis and/ or infiltrate. There is additional subtle asymmetric opacity within the right upper lobe, which may also reflect developing infectious infiltrate. This finding may in part be related to patient rotation (patient is rotated to the right). No pulmonary edema or pleural effusion. No pneumothorax.  No acute osseus abnormality.  IMPRESSION: 1. Shallow lung inflation with patchy and hazy left basilar opacities. While these findings may in part reflect atelectasis, possible  early/developing infiltrate could be considered in the correct clinical setting. 2. Additional subtle asymmetric patchy opacity within the right upper lobe, also suspicious for possible developing infectious infiltrate. 3. No other active cardiopulmonary disease.   Electronically Signed   By: Jeannine Boga M.D.   On: 09/24/2014 00:18     CBC  Recent Labs Lab 09/23/14 2129 09/25/14 0613 09/26/14 0410  WBC 11.2* 7.2 7.2  HGB 9.0* 8.8* 8.6*  HCT 27.7* 27.3* 25.8*  PLT 319 378 382  MCV 95.5 94.8 93.8  MCH 31.0 30.6 31.3  MCHC 32.5 32.2 33.3  RDW 18.3* 17.7* 17.6*  LYMPHSABS 1.1  --   --  MONOABS 0.2  --   --   EOSABS 0.0  --   --   BASOSABS 0.0  --   --     Chemistries   Recent Labs Lab 09/23/14 2129 09/25/14 0613 09/26/14 0410  NA 136 136 135  K 3.1* 2.9* 2.8*  CL 99* 101 103  CO2 '26 24 22  '$ GLUCOSE 184* 91 89  BUN <5* <5* <5*  CREATININE 0.52* 0.49* 0.52*  CALCIUM 8.4* 8.5* 8.6*  MG  --  1.3* 1.7  AST  --  54*  --   ALT  --  29  --   ALKPHOS  --  135*  --   BILITOT  --  0.7  --    ------------------------------------------------------------------------------------------------------------------ estimated creatinine clearance is 113.3 mL/min (by C-G formula based on Cr of 0.52). ------------------------------------------------------------------------------------------------------------------  Recent Labs  09/23/14 2129  HGBA1C 5.6   ------------------------------------------------------------------------------------------------------------------ No results for input(s): CHOL, HDL, LDLCALC, TRIG, CHOLHDL, LDLDIRECT in the last 72 hours. ------------------------------------------------------------------------------------------------------------------ No results for input(s): TSH, T4TOTAL, T3FREE, THYROIDAB in the last 72 hours.  Invalid input(s):  FREET3 ------------------------------------------------------------------------------------------------------------------ No results for input(s): VITAMINB12, FOLATE, FERRITIN, TIBC, IRON, RETICCTPCT in the last 72 hours.  Coagulation profile No results for input(s): INR, PROTIME in the last 168 hours.  No results for input(s): DDIMER in the last 72 hours.  Cardiac Enzymes No results for input(s): CKMB, TROPONINI, MYOGLOBIN in the last 168 hours.  Invalid input(s): CK ------------------------------------------------------------------------------------------------------------------ Invalid input(s): POCBNP   Time Spent in minutes  35   Marget Outten K M.D on 09/26/2014 at 11:20 AM  Between 7am to 7pm - Pager - [phone]  After 7pm go to [email or web address] - password [facility]  [facility] -  Office  [phone]
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
## 2014-09-27 ENCOUNTER — Ambulatory Visit: Payer: Self-pay

## 2014-09-27 LAB — BASIC METABOLIC PANEL
Anion gap: 10 (ref 5–15)
CALCIUM: 8.5 mg/dL — AB (ref 8.9–10.3)
CO2: 20 mmol/L — ABNORMAL LOW (ref 22–32)
Chloride: 104 mmol/L (ref 101–111)
Creatinine, Ser: 0.55 mg/dL — ABNORMAL LOW (ref 0.61–1.24)
GFR calc non Af Amer: >60 mL/min (ref >60)
Glucose, Bld: 101 mg/dL — ABNORMAL HIGH (ref 65–99)
POTASSIUM: 4 mmol/L (ref 3.5–5.1)
Sodium: 134 mmol/L — ABNORMAL LOW (ref 135–145)

## 2014-09-27 LAB — MAGNESIUM: Magnesium: 1.7 mg/dL (ref 1.7–2.4)

## 2014-09-27 LAB — GLUCOSE, CAPILLARY
Glucose-Capillary: 97 mg/dL (ref 65–99)
Glucose-Capillary: 126 mg/dL — ABNORMAL HIGH (ref 65–99)
Glucose-Capillary: 92 mg/dL (ref 65–99)

## 2014-09-27 MED ORDER — PHENYTOIN SODIUM EXTENDED 100 MG PO CAPS
100.0000 mg | ORAL_CAPSULE | Freq: Three times a day (TID) | ORAL | Status: DC
  Administered 2014-09-27: 100 mg via ORAL
  Filled 2014-09-27: qty 1

## 2014-09-27 MED ORDER — HEPARIN SOD (PORK) LOCK FLUSH 100 UNIT/ML IV SOLN
500.0000 [IU] | INTRAVENOUS | Status: DC | PRN
  Administered 2014-09-27: 500 [IU]
  Filled 2014-09-27 (×2): qty 5

## 2014-09-27 MED ORDER — PHENYTOIN SODIUM EXTENDED 100 MG PO CAPS: 100.0000 mg | ORAL_CAPSULE | Freq: Three times a day (TID) | ORAL | Status: DC

## 2014-09-27 MED ORDER — LEVETIRACETAM 750 MG PO TABS: 1500.0000 mg | ORAL_TABLET | Freq: Two times a day (BID) | ORAL | Status: DC

## 2014-09-27 MED ORDER — METFORMIN HCL 500 MG PO TABS: 500.0000 mg | ORAL_TABLET | Freq: Every day | ORAL | Status: DC

## 2014-09-27 MED ORDER — HEPARIN SOD (PORK) LOCK FLUSH 100 UNIT/ML IV SOLN
500.0000 [IU] | INTRAVENOUS | Status: DC
  Filled 2014-09-27: qty 5

## 2014-09-27 NOTE — Care Management Note (Addendum)
Case Management Note  Patient Details  Name: [name] MRN: [id number] Date of Birth: January 06, 1971  Subjective/Objective:     Patient lives with wife, but she works 75- 6 pm , patient will need someone with him 24 hrs secondary to seizures.  Wife is in the room and she will be checking with patient's sister [name] , who does not work , to see if she would be able to be with patient while she is at work.  [name] is at at MD appt right now, so wife will let NCM and CSW no when she speaks with her.  Wife states they have had [facility] before for [facility],  They really do not have the funds to pay for private sitters right now.   Per CSW, MD states family will make it work at home for patient with someone being with him.   Referral made to [facility] with [facility] for [facility] and Social Work, patient has medicaid so will not be able to get aide and pt.  Soc will begin 24-48 hrs post dc.            Action/Plan:   Expected Discharge Date:                  Expected Discharge Plan:  [place]  In-House Referral:  Clinical Social Work  Discharge planning Services  CM Consult  Post Acute Care Choice:    Choice offered to:  Patient  DME Arranged:    DME Agency:     HH Arranged:  RN [place] Agency:  [place]  Status of Service:  In process, will continue to follow  Medicare Important Message Given:    Date Medicare IM Given:    Medicare IM give by:    Date Additional Medicare IM Given:    Additional Medicare Important Message give by:     If discussed at [place] of Stay Meetings, dates discussed:    Additional Comments:  Zenon Mayo, RN 09/27/2014, 10:36 AM

## 2014-09-27 NOTE — Progress Notes (Signed)
Pt. Spouse received discharge instructions and prescriptions with pt. At bedside. Educated pt. On follow-up appointments. IV team flushed and hep. Lock port-a-cath. No new skin issues noted. Dressings changed and colostomy emptied. All questions answered. No further needs noted at this time.

## 2014-09-27 NOTE — Discharge Summary (Signed)
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  [phone]

## 2014-09-27 NOTE — Progress Notes (Signed)
Subjective: Patient is much improved, talking in full sentences and desiring to go home.   Objective: Current vital signs: BP 117/17 mmHg  Pulse 83  Temp(Src) 98.2 F (36.8 C) (Oral)  Resp 20  Ht '5\' 10"'$  (1.778 m)  Wt 69.4 kg (153 lb)  BMI 21.95 kg/m2  SpO2 100% Vital signs in last 24 hours: Temp:  [98.2 F (36.8 C)-98.6 F (37 C)] 98.2 F (36.8 C) (08/12 0620) Pulse Rate:  [83-99] 83 (08/12 0620) Resp:  [16-20] 20 (08/12 0620) BP: (113-122)/(17-83) 117/17 mmHg (08/12 0620) SpO2:  [99 %-100 %] 100 % (08/12 0620)  Intake/Output from previous day: 08/11 0701 - 08/12 0700 In: 1467.5 [P.O.:240; I.V.:1127.5; IV Piggyback:100] Out: 1675 [Urine:1275; Stool:400] Intake/Output this shift: Total I/O In: -  Out: 400 [Urine:400] Nutritional status: Diet regular Room service appropriate?: Yes with Assist; Fluid consistency:: Thin  Neurologic Exam:  Mental Status: Alert, oriented, thought content appropriate.  Speech fluent without evidence of aphasia.  Able to follow 3 step commands without difficulty. Cranial Nerves: II: Visual fields grossly normal, pupils equal, round, reactive to light and accommodation III,IV, VI: ptosis not present, extra-ocular motions intact bilaterally V,VII: smile symmetric, facial light touch sensation normal bilaterally VIII: hearing normal bilaterally IX,X: uvula rises symmetrically XI: bilateral shoulder shrug XII: midline tongue extension without atrophy or fasciculations  Motor: Right : Upper extremity   5/5    Left:     Upper extremity   5/5  Lower extremity   5/5     Lower extremity   5/5 Tone and bulk:normal tone throughout; no atrophy noted Sensory: Pinprick and light touch intact throughout, bilaterally Deep Tendon Reflexes:  Right: Upper Extremity   Left: Upper extremity   biceps (C-5 to C-6) 2/4   biceps (C-5 to C-6) 2/4 tricep (C7) 2/4    triceps (C7) 2/4 Brachioradialis (C6) 2/4  Brachioradialis (C6) 2/4  Lower Extremity Lower  Extremity  quadriceps (L-2 to L-4) 2/4   quadriceps (L-2 to L-4) 2/4 Achilles (S1) 2/4   Achilles (S1) 2/4     Lab Results: Basic Metabolic Panel:  Recent Labs Lab 09/23/14 2129 09/25/14 0613 09/26/14 0410 09/27/14 0345  NA 136 136 135 134*  K 3.1* 2.9* 2.8* 4.0  CL 99* 101 103 104  CO2 '26 24 22 '$ 20*  GLUCOSE 184* 91 89 101*  BUN <5* <5* <5* <5*  CREATININE 0.52* 0.49* 0.52* 0.55*  CALCIUM 8.4* 8.5* 8.6* 8.5*  MG  --  1.3* 1.7 1.7    Liver Function Tests:  Recent Labs Lab 09/24/14 1350 09/25/14 0613  AST  --  54*  ALT  --  29  ALKPHOS  --  135*  BILITOT  --  0.7  PROT  --  5.9*  ALBUMIN 2.9* 2.9*   No results for input(s): LIPASE, AMYLASE in the last 168 hours. No results for input(s): AMMONIA in the last 168 hours.  CBC:  Recent Labs Lab 09/23/14 2129 09/25/14 0613 09/26/14 0410  WBC 11.2* 7.2 7.2  NEUTROABS 9.9*  --   --   HGB 9.0* 8.8* 8.6*  HCT 27.7* 27.3* 25.8*  MCV 95.5 94.8 93.8  PLT 319 378 382    Cardiac Enzymes: No results for input(s): CKTOTAL, CKMB, CKMBINDEX, TROPONINI in the last 168 hours.  Lipid Panel: No results for input(s): CHOL, TRIG, HDL, CHOLHDL, VLDL, LDLCALC in the last 168 hours.  CBG:  Recent Labs Lab 09/26/14 0757 09/26/14 1223 09/26/14 1709 09/26/14 2139 09/27/14 0808  GLUCAP 91 130*  111* 23* 92    Microbiology: Results for orders placed or performed during the hospital encounter of 09/23/14  MRSA PCR Screening     Status: None   Collection Time: 09/24/14  2:40 AM  Result Value Ref Range Status   MRSA by PCR NEGATIVE NEGATIVE Final    Comment:        The GeneXpert MRSA Assay (FDA approved for NASAL specimens only), is one component of a comprehensive MRSA colonization surveillance program. It is not intended to diagnose MRSA infection nor to guide or monitor treatment for MRSA infections.     Coagulation Studies: No results for input(s): LABPROT, INR in the last 72 hours.  Imaging: Mr Kizzie Fantasia Contrast  09/26/2014   CLINICAL DATA:  44 year old male with metastatic small cell lung cancer. Status post whole brain radiation. New onset seizures, confusion. Subsequent encounter.  EXAM: MRI HEAD WITHOUT AND WITH CONTRAST  TECHNIQUE: Multiplanar, multiecho pulse sequences of the brain and surrounding structures were obtained without and with intravenous contrast.  CONTRAST:  20m MULTIHANCE GADOBENATE DIMEGLUMINE 529 MG/ML IV SOLN  COMPARISON:  Brain MRI 08/05/2014 and earlier.  FINDINGS: There is new restricted diffusion and enlargement of the left hippocampal formation tracking toward the amygdala (series 5, images 11-16 ; series 9, images 11-14) without associated enhancement, mass effect, or hemorrhage.  No other restricted diffusion identified. On postcontrast images there has been further regression of the known posterior left temporal lobe (series 14, image 20) and right superior parietal lobe (image 36) metastases. Mild associated hemosiderin. Sequelae of left frontotemporal craniotomy again noted. No new abnormal enhancement. No dural thickening.  Mild generalized cerebral volume loss is evident since June. Vague periventricular white matter T2 and FLAIR hyperintensity has developed. No intracranial mass effect or ventriculomegaly. No restricted diffusion or evidence of acute infarction. No acute intracranial hemorrhage identified. Negative pituitary, cervicomedullary junction, and visualized cervical spine. Major intracranial vascular flow voids are stable.  Negative visualized scalp soft tissues. Visualized bone marrow signal remains within normal limits. Orbits soft tissues appear normal. Visible internal auditory structures appear normal. Trace mastoid fluid and paranasal sinus mucosal thickening has increased.  IMPRESSION: 1. Confluent T2 hyperintensity and restricted diffusion in the left hippocampal formation compatible with sequelae of seizure/status epilepticus. No associated hemorrhage  or enhancement. 2. Further regression of 2 small brain metastases status post whole brain radiation earlier this year. 3. No other acute intracranial abnormality.   Electronically Signed   By: HGenevie AnnM.D.   On: 09/26/2014 14:34    Medications:  Scheduled: . enoxaparin (LOVENOX) injection  40 mg Subcutaneous QHS  . feeding supplement (ENSURE ENLIVE)  237 mL Oral BID BM  . insulin aspart  0-5 Units Subcutaneous QHS  . insulin aspart  0-9 Units Subcutaneous TID WC  . levETIRAcetam  1,500 mg Oral BID  . phenytoin (DILANTIN) IV  100 mg Intravenous 3 times per day  . sodium chloride  10-40 mL Intracatheter Q12H    Assessment/Plan: No further seizure activity--much improved. Talking in full sentences.   Recommend: 1) Continue AED regime. On discharge may change Dilantin to 300 mg Q HS.    DAmery MinasyanPA-C [facility] [phone] 09/27/2014, 9:23 AM  I personally participated in this patient's evaluation and management, including formulating the above clinical impression and management recommendations. No further neurological intervention is indicated acutely.  CRush FarmerM.D. [facility] [phone]

## 2014-09-27 NOTE — Clinical Social Work Note (Signed)
Patient does not present with a skilled need. RNCM and CSW met with patient and significant other at bedside. Family will plan to take the patient home with supervision.   [name] MSW, [place], [place], [phone]

## 2014-09-27 NOTE — Discharge Instructions (Signed)
Follow with Primary MD Arnoldo Morale, MD in 7 days   Get CBC, CMP, 2 view Chest X ray checked  by Primary MD next visit.    Activity: As tolerated with Full fall precautions use walker/cane & assistance as needed   Disposition Home     Diet: Heart Healthy Low Carb  with feeding assistance and aspiration precautions.  For Heart failure patients - Check your Weight same time everyday, if you gain over 2 pounds, or you develop in leg swelling, experience more shortness of breath or chest pain, call your Primary MD immediately. Follow Cardiac Low Salt Diet and 1.5 lit/day fluid restriction.   On your next visit with your primary care physician please Get Medicines reviewed and adjusted.   Please request your Prim.MD to go over all Hospital Tests and Procedure/Radiological results at the follow up, please get all Hospital records sent to your Prim MD by signing hospital release before you go home.   If you experience worsening of your admission symptoms, develop shortness of breath, life threatening emergency, suicidal or homicidal thoughts you must seek medical attention immediately by calling 911 or calling your MD immediately  if symptoms less severe.  You Must read complete instructions/literature along with all the possible adverse reactions/side effects for all the Medicines you take and that have been prescribed to you. Take any new Medicines after you have completely understood and accpet all the possible adverse reactions/side effects.   Do not drive, operating heavy machinery, perform activities at heights, swimming or participation in water activities or provide baby sitting services until you have seen by Primary MD or a Neurologist and advised to do so again.  Do not drive when taking Pain medications.    Do not take more than prescribed Pain, Sleep and Anxiety Medications  Special Instructions: If you have smoked or chewed Tobacco  in the last 2 yrs please stop smoking, stop  any regular Alcohol  and or any Recreational drug use.  Wear Seat belts while driving.   Please note  You were cared for by a hospitalist during your hospital stay. If you have any questions about your discharge medications or the care you received while you were in the hospital after you are discharged, you can call the unit and asked to speak with the hospitalist on call if the hospitalist that took care of you is not available. Once you are discharged, your primary care physician will handle any further medical issues. Please note that NO REFILLS for any discharge medications will be authorized once you are discharged, as it is imperative that you return to your primary care physician (or establish a relationship with a primary care physician if you do not have one) for your aftercare needs so that they can reassess your need for medications and monitor your lab values.

## 2014-09-30 ENCOUNTER — Telehealth: Payer: Self-pay | Admitting: *Deleted

## 2014-09-30 ENCOUNTER — Telehealth: Payer: Self-pay | Admitting: Hematology and Oncology

## 2014-09-30 ENCOUNTER — Ambulatory Visit: Payer: Self-pay

## 2014-09-30 ENCOUNTER — Other Ambulatory Visit: Payer: Self-pay | Admitting: Hematology and Oncology

## 2014-09-30 NOTE — Telephone Encounter (Signed)
Infusion room is full this week I can see him with resumption of Rx next week unless he needs to be seen to address pain and other issues I can see him next Monday with labs and chemo or Wed Please let me know

## 2014-09-30 NOTE — Telephone Encounter (Signed)
I will place new POF

## 2014-09-30 NOTE — Telephone Encounter (Signed)
VM from pt's fiance states pt has been d/c'd home from hospital and need to make f/u appt w/ Dr. Alvy Bimler.

## 2014-09-30 NOTE — Telephone Encounter (Signed)
Called and left a message with 8/24 appointments and sent michelle an inbox to help with chemo

## 2014-09-30 NOTE — Telephone Encounter (Signed)
Informed Fiance of orders to scheduled pt for next week.  She verbalized understanding.

## 2014-09-30 NOTE — Telephone Encounter (Signed)
Fiance says pt is ok to see Dr. Alvy Bimler and resume treatment next week on Wed 8/24 if possible.  Prefers to keep tx schedule on Wed, Thurs, Fri. Marland Kitchen

## 2014-10-03 ENCOUNTER — Other Ambulatory Visit: Payer: Self-pay | Admitting: Neurosurgery

## 2014-10-03 ENCOUNTER — Ambulatory Visit
Admission: RE | Admit: 2014-10-03 | Discharge: 2014-10-03 | Disposition: A | Discharge status: Home / Self Care | Payer: Medicaid Other | Source: Ambulatory Visit | Attending: Neurosurgery | Admitting: Neurosurgery

## 2014-10-03 DIAGNOSIS — M542 Cervicalgia: Secondary | ICD-10-CM

## 2014-10-04 ENCOUNTER — Ambulatory Visit (INDEPENDENT_AMBULATORY_CARE_PROVIDER_SITE_OTHER): Payer: Medicaid Other | Admitting: Diagnostic Neuroimaging

## 2014-10-04 ENCOUNTER — Encounter: Payer: Self-pay | Admitting: Diagnostic Neuroimaging

## 2014-10-04 VITALS — BP 93/63 | HR 102 | Ht 65.5 in | Wt 152.4 lb

## 2014-10-04 DIAGNOSIS — G40109 Localization-related (focal) (partial) symptomatic epilepsy and epileptic syndromes with simple partial seizures, not intractable, without status epilepticus: Secondary | ICD-10-CM | POA: Insufficient documentation

## 2014-10-04 DIAGNOSIS — C3491 Malignant neoplasm of unspecified part of right bronchus or lung: Secondary | ICD-10-CM | POA: Diagnosis not present

## 2014-10-04 MED ORDER — LEVETIRACETAM 750 MG PO TABS: 1500.0000 mg | ORAL_TABLET | Freq: Two times a day (BID) | ORAL | Status: DC

## 2014-10-04 MED ORDER — PHENYTOIN SODIUM EXTENDED 100 MG PO CAPS: 300.0000 mg | ORAL_CAPSULE | Freq: Every day | ORAL | Status: DC
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
## 2014-10-04 NOTE — Patient Instructions (Signed)
Take levetiracetam '1500mg'$  twice a day.  Take phenytoin '300mg'$  at bedtime.

## 2014-10-09 ENCOUNTER — Telehealth: Payer: Self-pay | Admitting: *Deleted

## 2014-10-09 ENCOUNTER — Ambulatory Visit (HOSPITAL_BASED_OUTPATIENT_CLINIC_OR_DEPARTMENT_OTHER): Payer: Medicaid Other

## 2014-10-09 ENCOUNTER — Other Ambulatory Visit (HOSPITAL_BASED_OUTPATIENT_CLINIC_OR_DEPARTMENT_OTHER): Payer: Medicaid Other

## 2014-10-09 ENCOUNTER — Telehealth: Payer: Self-pay | Admitting: Hematology and Oncology

## 2014-10-09 ENCOUNTER — Encounter: Payer: Self-pay | Admitting: Hematology and Oncology

## 2014-10-09 ENCOUNTER — Ambulatory Visit (HOSPITAL_BASED_OUTPATIENT_CLINIC_OR_DEPARTMENT_OTHER): Payer: Medicaid Other | Admitting: Hematology and Oncology

## 2014-10-09 ENCOUNTER — Other Ambulatory Visit: Payer: Self-pay

## 2014-10-09 ENCOUNTER — Ambulatory Visit: Payer: Medicaid Other

## 2014-10-09 ENCOUNTER — Ambulatory Visit: Payer: Self-pay | Admitting: Hematology and Oncology

## 2014-10-09 ENCOUNTER — Ambulatory Visit: Payer: Self-pay

## 2014-10-09 VITALS — BP 127/76 | HR 85 | Temp 97.5°F | Resp 18 | Ht 65.5 in | Wt 152.6 lb

## 2014-10-09 DIAGNOSIS — C3411 Malignant neoplasm of upper lobe, right bronchus or lung: Secondary | ICD-10-CM

## 2014-10-09 DIAGNOSIS — C7949 Secondary malignant neoplasm of other parts of nervous system: Secondary | ICD-10-CM

## 2014-10-09 DIAGNOSIS — Z95828 Presence of other vascular implants and grafts: Secondary | ICD-10-CM

## 2014-10-09 DIAGNOSIS — Z5111 Encounter for antineoplastic chemotherapy: Secondary | ICD-10-CM | POA: Diagnosis not present

## 2014-10-09 DIAGNOSIS — C3490 Malignant neoplasm of unspecified part of unspecified bronchus or lung: Secondary | ICD-10-CM

## 2014-10-09 DIAGNOSIS — C7931 Secondary malignant neoplasm of brain: Secondary | ICD-10-CM | POA: Diagnosis not present

## 2014-10-09 DIAGNOSIS — G8929 Other chronic pain: Secondary | ICD-10-CM

## 2014-10-09 DIAGNOSIS — D6481 Anemia due to antineoplastic chemotherapy: Secondary | ICD-10-CM | POA: Diagnosis not present

## 2014-10-09 DIAGNOSIS — M545 Low back pain: Secondary | ICD-10-CM | POA: Diagnosis not present

## 2014-10-09 DIAGNOSIS — R569 Unspecified convulsions: Secondary | ICD-10-CM

## 2014-10-09 DIAGNOSIS — T451X5A Adverse effect of antineoplastic and immunosuppressive drugs, initial encounter: Secondary | ICD-10-CM

## 2014-10-09 DIAGNOSIS — C3491 Malignant neoplasm of unspecified part of right bronchus or lung: Secondary | ICD-10-CM

## 2014-10-09 LAB — CBC WITH DIFFERENTIAL/PLATELET
BASO%: 0.3 % (ref 0.0–2.0)
Basophils Absolute: 0 10*3/uL (ref 0.0–0.1)
EOS ABS: 0.1 10*3/uL (ref 0.0–0.5)
EOS%: 0.9 % (ref 0.0–7.0)
HCT: 30.5 % — ABNORMAL LOW (ref 38.4–49.9)
HEMOGLOBIN: 9.7 g/dL — AB (ref 13.0–17.1)
LYMPH%: 16.6 % (ref 14.0–49.0)
MCH: 29.4 pg (ref 27.2–33.4)
MCHC: 31.8 g/dL — ABNORMAL LOW (ref 32.0–36.0)
MCV: 92.4 fL (ref 79.3–98.0)
MONO#: 0.8 10*3/uL (ref 0.1–0.9)
MONO%: 11.9 % (ref 0.0–14.0)
NEUT#: 4.8 10*3/uL (ref 1.5–6.5)
NEUT%: 70.3 % (ref 39.0–75.0)
Platelets: 444 10*3/uL — ABNORMAL HIGH (ref 140–400)
RBC: 3.3 10*6/uL — ABNORMAL LOW (ref 4.20–5.82)
RDW: 15.9 % — AB (ref 11.0–14.6)
WBC: 6.9 10*3/uL (ref 4.0–10.3)
lymph#: 1.1 10*3/uL (ref 0.9–3.3)

## 2014-10-09 LAB — COMPREHENSIVE METABOLIC PANEL (CC13)
ALBUMIN: 2.9 g/dL — AB (ref 3.5–5.0)
ALK PHOS: 213 U/L — AB (ref 40–150)
ALT: 44 U/L (ref 0–55)
AST: 52 U/L — ABNORMAL HIGH (ref 5–34)
Anion Gap: 10 mEq/L (ref 3–11)
BUN: 5.9 mg/dL — ABNORMAL LOW (ref 7.0–26.0)
CO2: 25 mEq/L (ref 22–29)
Calcium: 9.6 mg/dL (ref 8.4–10.4)
Chloride: 102 mEq/L (ref 98–109)
Creatinine: 0.6 mg/dL — ABNORMAL LOW (ref 0.7–1.3)
EGFR: >90 mL/min/{1.73_m2} (ref >90)
GLUCOSE: 95 mg/dL (ref 70–140)
Potassium: 3.9 mEq/L (ref 3.5–5.1)
Sodium: 137 mEq/L (ref 136–145)
Total Bilirubin: 0.22 mg/dL (ref 0.20–1.20)
Total Protein: 6.3 g/dL — ABNORMAL LOW (ref 6.4–8.3)

## 2014-10-09 MED ORDER — SODIUM CHLORIDE 0.9 % IV SOLN
544.1250 mg | Freq: Once | INTRAVENOUS | Status: AC
  Administered 2014-10-09: 540 mg via INTRAVENOUS
  Filled 2014-10-09: qty 54

## 2014-10-09 MED ORDER — SODIUM CHLORIDE 0.9 % IJ SOLN
10.0000 mL | INTRAMUSCULAR | Status: DC | PRN
  Administered 2014-10-09: 10 mL via INTRAVENOUS
  Filled 2014-10-09: qty 10

## 2014-10-09 MED ORDER — SODIUM CHLORIDE 0.9 % IV SOLN
10.0000 mg | Freq: Once | INTRAVENOUS | Status: AC
  Administered 2014-10-09: 10 mg via INTRAVENOUS
  Filled 2014-10-09: qty 1

## 2014-10-09 MED ORDER — SODIUM CHLORIDE 0.9 % IJ SOLN
10.0000 mL | INTRAMUSCULAR | Status: DC | PRN
  Administered 2014-10-09: 10 mL
  Filled 2014-10-09: qty 10

## 2014-10-09 MED ORDER — HEPARIN SOD (PORK) LOCK FLUSH 100 UNIT/ML IV SOLN
500.0000 [IU] | Freq: Once | INTRAVENOUS | Status: AC | PRN
  Administered 2014-10-09: 500 [IU]
  Filled 2014-10-09: qty 5

## 2014-10-09 MED ORDER — SODIUM CHLORIDE 0.9 % IV SOLN
Freq: Once | INTRAVENOUS | Status: AC
  Administered 2014-10-09: 11:00:00 via INTRAVENOUS

## 2014-10-09 MED ORDER — SODIUM CHLORIDE 0.9 % IV SOLN
100.0000 mg/m2 | Freq: Once | INTRAVENOUS | Status: AC
  Administered 2014-10-09: 190 mg via INTRAVENOUS
  Filled 2014-10-09: qty 9.5

## 2014-10-09 MED ORDER — PALONOSETRON HCL INJECTION 0.25 MG/5ML
0.2500 mg | Freq: Once | INTRAVENOUS | Status: AC
  Administered 2014-10-09: 0.25 mg via INTRAVENOUS

## 2014-10-09 MED ORDER — HEPARIN SOD (PORK) LOCK FLUSH 100 UNIT/ML IV SOLN
500.0000 [IU] | Freq: Once | INTRAVENOUS | Status: DC
  Filled 2014-10-09: qty 5

## 2014-10-09 MED ORDER — PALONOSETRON HCL INJECTION 0.25 MG/5ML
INTRAVENOUS | Status: AC
  Filled 2014-10-09: qty 5

## 2014-10-09 NOTE — Telephone Encounter (Signed)
per pof to sch pt appt-sent MW emailt o sch pt trmt-pt stated will get updated copy in trmt room and she will look on MY CHART no call neccesary

## 2014-10-09 NOTE — Patient Instructions (Signed)
[facility] Discharge Instructions for Patients Receiving Chemotherapy  Today you received the following chemotherapy agents: carboplatin and Vepesid   To help prevent nausea and vomiting after your treatment, we encourage you to take your nausea medication as directed.    If you develop nausea and vomiting that is not controlled by your nausea medication, call the clinic.   BELOW ARE SYMPTOMS THAT SHOULD BE REPORTED IMMEDIATELY:  *FEVER GREATER THAN 100.5 F  *CHILLS WITH OR WITHOUT FEVER  NAUSEA AND VOMITING THAT IS NOT CONTROLLED WITH YOUR NAUSEA MEDICATION  *UNUSUAL SHORTNESS OF BREATH  *UNUSUAL BRUISING OR BLEEDING  TENDERNESS IN MOUTH AND THROAT WITH OR WITHOUT PRESENCE OF ULCERS  *URINARY PROBLEMS  *BOWEL PROBLEMS  UNUSUAL RASH Items with * indicate a potential emergency and should be followed up as soon as possible.  Feel free to call the clinic you have any questions or concerns. The clinic phone number is [phone].  Please show the [place] at check-in to the Emergency Department and triage nurse.

## 2014-10-09 NOTE — Telephone Encounter (Signed)
Per staff message and POF I have scheduled appts. Advised scheduler of appts. JMW  

## 2014-10-09 NOTE — Progress Notes (Signed)
Checked in pt for lobby. Pt signed AOB.

## 2014-10-09 NOTE — Patient Instructions (Signed)

## 2014-10-10 ENCOUNTER — Ambulatory Visit: Payer: Medicaid Other

## 2014-10-10 ENCOUNTER — Emergency Department (HOSPITAL_COMMUNITY): Payer: Medicaid Other

## 2014-10-10 ENCOUNTER — Telehealth: Payer: Self-pay | Admitting: *Deleted

## 2014-10-10 ENCOUNTER — Encounter (HOSPITAL_COMMUNITY): Payer: Self-pay

## 2014-10-10 ENCOUNTER — Ambulatory Visit: Payer: Self-pay

## 2014-10-10 ENCOUNTER — Emergency Department (HOSPITAL_COMMUNITY)
Admission: EM | Admit: 2014-10-10 | Discharge: 2014-10-10 | Disposition: A | Discharge status: Home / Self Care | Payer: Medicaid Other | Attending: Emergency Medicine | Admitting: Emergency Medicine

## 2014-10-10 DIAGNOSIS — T451X5A Adverse effect of antineoplastic and immunosuppressive drugs, initial encounter: Secondary | ICD-10-CM

## 2014-10-10 DIAGNOSIS — Z79899 Other long term (current) drug therapy: Secondary | ICD-10-CM | POA: Diagnosis not present

## 2014-10-10 DIAGNOSIS — Z85841 Personal history of malignant neoplasm of brain: Secondary | ICD-10-CM | POA: Insufficient documentation

## 2014-10-10 DIAGNOSIS — I10 Essential (primary) hypertension: Secondary | ICD-10-CM | POA: Diagnosis not present

## 2014-10-10 DIAGNOSIS — Z8719 Personal history of other diseases of the digestive system: Secondary | ICD-10-CM | POA: Diagnosis not present

## 2014-10-10 DIAGNOSIS — C3490 Malignant neoplasm of unspecified part of unspecified bronchus or lung: Secondary | ICD-10-CM | POA: Diagnosis not present

## 2014-10-10 DIAGNOSIS — E876 Hypokalemia: Secondary | ICD-10-CM | POA: Insufficient documentation

## 2014-10-10 DIAGNOSIS — G40909 Epilepsy, unspecified, not intractable, without status epilepticus: Secondary | ICD-10-CM | POA: Diagnosis not present

## 2014-10-10 DIAGNOSIS — R569 Unspecified convulsions: Secondary | ICD-10-CM

## 2014-10-10 DIAGNOSIS — Z8505 Personal history of malignant neoplasm of liver: Secondary | ICD-10-CM | POA: Diagnosis not present

## 2014-10-10 DIAGNOSIS — D6481 Anemia due to antineoplastic chemotherapy: Secondary | ICD-10-CM | POA: Insufficient documentation

## 2014-10-10 DIAGNOSIS — Z87891 Personal history of nicotine dependence: Secondary | ICD-10-CM | POA: Insufficient documentation

## 2014-10-10 LAB — COMPREHENSIVE METABOLIC PANEL
ALT: 35 U/L (ref 17–63)
ANION GAP: 7 (ref 5–15)
AST: 45 U/L — ABNORMAL HIGH (ref 15–41)
Albumin: 3 g/dL — ABNORMAL LOW (ref 3.5–5.0)
Alkaline Phosphatase: 159 U/L — ABNORMAL HIGH (ref 38–126)
BUN: 8 mg/dL (ref 6–20)
CALCIUM: 8.8 mg/dL — AB (ref 8.9–10.3)
CO2: 26 mmol/L (ref 22–32)
Chloride: 104 mmol/L (ref 101–111)
Creatinine, Ser: 0.52 mg/dL — ABNORMAL LOW (ref 0.61–1.24)
Glucose, Bld: 94 mg/dL (ref 65–99)
Potassium: 3.1 mmol/L — ABNORMAL LOW (ref 3.5–5.1)
Sodium: 137 mmol/L (ref 135–145)
Total Bilirubin: 0.4 mg/dL (ref 0.3–1.2)
Total Protein: 5.9 g/dL — ABNORMAL LOW (ref 6.5–8.1)

## 2014-10-10 LAB — CBC WITH DIFFERENTIAL/PLATELET
Basophils Absolute: 0 10*3/uL (ref 0.0–0.1)
Basophils Relative: 1 % (ref 0–1)
EOS ABS: 0.1 10*3/uL (ref 0.0–0.7)
Eosinophils Relative: 2 % (ref 0–5)
HCT: 27.7 % — ABNORMAL LOW (ref 39.0–52.0)
Hemoglobin: 8.6 g/dL — ABNORMAL LOW (ref 13.0–17.0)
LYMPHS ABS: 1.3 10*3/uL (ref 0.7–4.0)
LYMPHS PCT: 21 % (ref 12–46)
MCH: 28.8 pg (ref 26.0–34.0)
MCHC: 31 g/dL (ref 30.0–36.0)
MCV: 92.6 fL (ref 78.0–100.0)
MONOS PCT: 13 % — AB (ref 3–12)
Monocytes Absolute: 0.8 10*3/uL (ref 0.1–1.0)
Neutro Abs: 3.9 10*3/uL (ref 1.7–7.7)
Neutrophils Relative %: 63 % (ref 43–77)
PLATELETS: 426 10*3/uL — AB (ref 150–400)
RBC: 2.99 MIL/uL — AB (ref 4.22–5.81)
RDW: 15.9 % — ABNORMAL HIGH (ref 11.5–15.5)
WBC: 6.1 10*3/uL (ref 4.0–10.5)

## 2014-10-10 LAB — PHENYTOIN LEVEL, TOTAL

## 2014-10-10 MED ORDER — POTASSIUM CHLORIDE CRYS ER 20 MEQ PO TBCR
40.0000 meq | EXTENDED_RELEASE_TABLET | Freq: Once | ORAL | Status: AC
  Administered 2014-10-10: 40 meq via ORAL
  Filled 2014-10-10: qty 2

## 2014-10-10 MED ORDER — LORAZEPAM 2 MG/ML IJ SOLN
1.0000 mg | Freq: Once | INTRAMUSCULAR | Status: AC
  Administered 2014-10-10: 1 mg via INTRAVENOUS
  Filled 2014-10-10: qty 1

## 2014-10-10 MED ORDER — SODIUM CHLORIDE 0.9 % IV SOLN
1000.0000 mg | INTRAVENOUS | Status: AC
  Administered 2014-10-10: 1000 mg via INTRAVENOUS
  Filled 2014-10-10: qty 20

## 2014-10-10 MED ORDER — DEXAMETHASONE SODIUM PHOSPHATE 10 MG/ML IJ SOLN
10.0000 mg | Freq: Once | INTRAMUSCULAR | Status: AC
  Administered 2014-10-10: 10 mg via INTRAVENOUS
  Filled 2014-10-10: qty 1

## 2014-10-10 MED ORDER — PROCHLORPERAZINE MALEATE 10 MG PO TABS
ORAL_TABLET | ORAL | Status: AC
  Filled 2014-10-10: qty 1

## 2014-10-10 MED ORDER — HEPARIN SOD (PORK) LOCK FLUSH 100 UNIT/ML IV SOLN
500.0000 [IU] | Freq: Once | INTRAVENOUS | Status: AC
  Administered 2014-10-10: 500 [IU]
  Filled 2014-10-10: qty 5

## 2014-10-10 NOTE — Assessment & Plan Note (Signed)
This is likely due to recent treatment. The patient denies recent history of bleeding such as epistaxis, hematuria or hematochezia. He is asymptomatic from the anemia. I will observe for now.  He does not require transfusion now. I will reduce the dose of carboplatin

## 2014-10-10 NOTE — Assessment & Plan Note (Signed)
He has fully recovered from recent postictal confusion state. I will proceed with chemotherapy with minor dose adjustment to carboplatin due to recent significant pancytopenia. I am aware that it might be potential interaction with phenytoin and will I will monitor for side effects carefully.

## 2014-10-10 NOTE — Discharge Instructions (Signed)
1. Medications: Continue taking Dilantin, usual home medications 2. Treatment: rest, drink plenty of fluids,  3. Follow Up: Please followup with your primary doctor in 1-2 days for discussion of your diagnoses and further evaluation after today's visit; if you do not have a primary care doctor use the resource guide provided to find one; Please return to the ER for return of seizures or other concerns
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
## 2014-10-10 NOTE — ED Notes (Signed)
Patient was brought from the [facility] via wheelchair for c/o possible seizure. Patient states intermittent left arm twitching that began last night at 2130. Patient was scheduled for chemo, but was brought to the ED for possible seizure. Left arm twitching presently. Patient is alert and oriented at this time. Patient was recently discharged with diagnosis of seizure disorder.

## 2014-10-10 NOTE — Assessment & Plan Note (Signed)
He has significant back pain and abdominal pain from recent surgery. I recommend to stay on IR morphine for now. We discussed narcotic refill policy.  He is not due to prescription refill today.

## 2014-10-10 NOTE — Telephone Encounter (Signed)
Call from [place] requesting date of onset was blank on ostomy supply form.  This nurse observed April 22. 2016 as date of surgery.  Mirna Mires will fax form to provider for completion.

## 2014-10-10 NOTE — Telephone Encounter (Signed)
SPOKE WITH DR.GORSUCH'S NURSE, TAMMI HOLLAND,RN. PT. NEEDS TO GO HOME TODAY AND RETURN TO THE CANCER CENTER TOMORROW AS SCHEDULED. NOTIFIED [facility].

## 2014-10-10 NOTE — ED Notes (Signed)
Patient transported to CT 

## 2014-10-10 NOTE — ED Notes (Signed)
Bed: RESA Expected date:  Expected time:  Means of arrival:  Comments: Cancer ctr pt seizure

## 2014-10-10 NOTE — Telephone Encounter (Signed)
Pls keep an eye on it, Tammi
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
## 2014-10-10 NOTE — ED Notes (Signed)
Broken scanner reported to the ED secreatary

## 2014-10-10 NOTE — Telephone Encounter (Signed)
Completed form faxed to [place] [employer].

## 2014-10-10 NOTE — Assessment & Plan Note (Signed)
He denies further seizures since discharge from the hospital. I am aware that he is now on phenytoin that could affect metabolism of chemotherapy. As above, I am reducing the dose of carboplatin the little bit. We will monitor his blood work carefully while on this medication

## 2014-10-11 ENCOUNTER — Encounter: Payer: Self-pay | Admitting: Pharmacist

## 2014-10-11 ENCOUNTER — Other Ambulatory Visit: Payer: Self-pay | Admitting: *Deleted

## 2014-10-11 ENCOUNTER — Ambulatory Visit: Payer: Self-pay

## 2014-10-11 ENCOUNTER — Ambulatory Visit (HOSPITAL_BASED_OUTPATIENT_CLINIC_OR_DEPARTMENT_OTHER): Payer: Medicaid Other

## 2014-10-11 VITALS — BP 100/69 | HR 80 | Temp 97.4°F | Resp 16

## 2014-10-11 DIAGNOSIS — Z5111 Encounter for antineoplastic chemotherapy: Secondary | ICD-10-CM | POA: Diagnosis present

## 2014-10-11 DIAGNOSIS — C3490 Malignant neoplasm of unspecified part of unspecified bronchus or lung: Secondary | ICD-10-CM

## 2014-10-11 DIAGNOSIS — C3411 Malignant neoplasm of upper lobe, right bronchus or lung: Secondary | ICD-10-CM | POA: Diagnosis not present

## 2014-10-11 MED ORDER — HEPARIN SOD (PORK) LOCK FLUSH 100 UNIT/ML IV SOLN
500.0000 [IU] | Freq: Once | INTRAVENOUS | Status: DC | PRN
  Filled 2014-10-11: qty 5

## 2014-10-11 MED ORDER — SODIUM CHLORIDE 0.9 % IJ SOLN
10.0000 mL | INTRAMUSCULAR | Status: DC | PRN
  Filled 2014-10-11: qty 10

## 2014-10-11 MED ORDER — PROCHLORPERAZINE MALEATE 10 MG PO TABS
10.0000 mg | ORAL_TABLET | Freq: Once | ORAL | Status: AC
  Administered 2014-10-11: 10 mg via ORAL

## 2014-10-11 MED ORDER — PROCHLORPERAZINE MALEATE 10 MG PO TABS
ORAL_TABLET | ORAL | Status: AC
  Filled 2014-10-11: qty 2

## 2014-10-11 MED ORDER — SODIUM CHLORIDE 0.9 % IV SOLN
Freq: Once | INTRAVENOUS | Status: AC
  Administered 2014-10-11: 11:00:00 via INTRAVENOUS

## 2014-10-11 MED ORDER — ETOPOSIDE CHEMO INJECTION 1 GM/50ML
100.0000 mg/m2 | Freq: Once | INTRAVENOUS | Status: AC
  Administered 2014-10-11: 190 mg via INTRAVENOUS
  Filled 2014-10-11: qty 9.5

## 2014-10-11 MED ORDER — POTASSIUM CHLORIDE CRYS ER 20 MEQ PO TBCR: EXTENDED_RELEASE_TABLET | ORAL | Status: DC

## 2014-10-11 NOTE — Patient Instructions (Signed)
[name] Discharge Instructions for Patients Receiving Chemotherapy  Today you received the following chemotherapy agents Etoposide.  To help prevent nausea and vomiting after your treatment, we encourage you to take your nausea medication Compazine 10 mg every 6 hours as needed.  If you develop nausea and vomiting that is not controlled by your nausea medication, call the clinic.   BELOW ARE SYMPTOMS THAT SHOULD BE REPORTED IMMEDIATELY:  *FEVER GREATER THAN 100.5 F  *CHILLS WITH OR WITHOUT FEVER  NAUSEA AND VOMITING THAT IS NOT CONTROLLED WITH YOUR NAUSEA MEDICATION  *UNUSUAL SHORTNESS OF BREATH  *UNUSUAL BRUISING OR BLEEDING  TENDERNESS IN MOUTH AND THROAT WITH OR WITHOUT PRESENCE OF ULCERS  *URINARY PROBLEMS  *BOWEL PROBLEMS  UNUSUAL RASH Items with * indicate a potential emergency and should be followed up as soon as possible.  Feel free to call the clinic you have any questions or concerns. The clinic phone number is [phone].  Please show the [place] at check-in to the Emergency Department and triage nurse.

## 2014-10-11 NOTE — Progress Notes (Signed)
Routed potassium level to Dr Alvy Bimler

## 2014-10-11 NOTE — Progress Notes (Signed)
Pt's potassium level was 3.1 on 10/10/14. Lawana Pai RN notified. Pt denies having diarrhea presently.

## 2014-10-11 NOTE — Progress Notes (Signed)
I s/w pt in infusion today re: seizure medications. He states he is taking Keppra and Dilantin as prescribed but has difficulty swallowing Keppra and has been splitting his immediate release tablets since they're scored.  It takes him a little while to get all of his doses in but he does take the daily prescribed amounts. His Phenytoin level was low in ED yesterday (< 2.5 mcg/mL) and given load if IV dilantin 1000 mg.  Oral dose not adjusted. I advised pt to contact neurologist for appt to evaluate the following issues: Recheck Phenytoin level.  Since pt is on Carboplatin, this may be leading to decrease in serum Phenytoin level.  The dose of Carbo has been reduced this week to help minimize this potential but should still be monitored and dose adjusted, especially since pt has had s/sxs of seizure (L arm twitching/numbness).  If/when Carboplatin is discontinued, he would obviously need another level to consider dose adjustment to maintain therapeutic range at that time as well. Consider changing formulation of Keppra for easier administration.  Keppra is available as oral disintegrating tablet that could be dissolved on tongue or dissolved in water for oral solution.  I advised pt NOT to try this w/ current tablets of Keppra.   Pt will discuss w/ his sister and they will try to get appt w/ neurology. Kennith Center, Pharm.D., CPP 10/11/2014'@12'$ :55 PM

## 2014-10-12 ENCOUNTER — Ambulatory Visit: Payer: Self-pay

## 2014-10-14 ENCOUNTER — Other Ambulatory Visit (HOSPITAL_BASED_OUTPATIENT_CLINIC_OR_DEPARTMENT_OTHER): Payer: Medicaid Other

## 2014-10-14 ENCOUNTER — Ambulatory Visit: Payer: Self-pay

## 2014-10-14 ENCOUNTER — Other Ambulatory Visit: Payer: Self-pay | Admitting: Hematology and Oncology

## 2014-10-14 ENCOUNTER — Ambulatory Visit (HOSPITAL_BASED_OUTPATIENT_CLINIC_OR_DEPARTMENT_OTHER): Payer: Medicaid Other

## 2014-10-14 ENCOUNTER — Telehealth: Payer: Self-pay | Admitting: Hematology and Oncology

## 2014-10-14 VITALS — BP 111/77 | HR 92 | Temp 98.2°F

## 2014-10-14 DIAGNOSIS — C3411 Malignant neoplasm of upper lobe, right bronchus or lung: Secondary | ICD-10-CM

## 2014-10-14 DIAGNOSIS — C3490 Malignant neoplasm of unspecified part of unspecified bronchus or lung: Secondary | ICD-10-CM

## 2014-10-14 DIAGNOSIS — C3491 Malignant neoplasm of unspecified part of right bronchus or lung: Secondary | ICD-10-CM

## 2014-10-14 DIAGNOSIS — Z5189 Encounter for other specified aftercare: Secondary | ICD-10-CM

## 2014-10-14 LAB — CBC WITH DIFFERENTIAL/PLATELET
BASO%: 0.4 % (ref 0.0–2.0)
BASOS ABS: 0 10*3/uL (ref 0.0–0.1)
EOS%: 1.3 % (ref 0.0–7.0)
Eosinophils Absolute: 0.1 10*3/uL (ref 0.0–0.5)
HCT: 32.4 % — ABNORMAL LOW (ref 38.4–49.9)
HGB: 10.5 g/dL — ABNORMAL LOW (ref 13.0–17.1)
LYMPH%: 19.4 % (ref 14.0–49.0)
MCH: 29.6 pg (ref 27.2–33.4)
MCHC: 32.4 g/dL (ref 32.0–36.0)
MCV: 91.3 fL (ref 79.3–98.0)
MONO#: 0.2 10*3/uL (ref 0.1–0.9)
MONO%: 3.8 % (ref 0.0–14.0)
NEUT#: 3.5 10*3/uL (ref 1.5–6.5)
NEUT%: 75.1 % — AB (ref 39.0–75.0)
Platelets: 321 10*3/uL (ref 140–400)
RBC: 3.55 10*6/uL — ABNORMAL LOW (ref 4.20–5.82)
RDW: 15.7 % — ABNORMAL HIGH (ref 11.0–14.6)
WBC: 4.7 10*3/uL (ref 4.0–10.3)
lymph#: 0.9 10*3/uL (ref 0.9–3.3)
nRBC: 0 % (ref 0–0)

## 2014-10-14 LAB — COMPREHENSIVE METABOLIC PANEL (CC13)
ALT: 44 U/L (ref 0–55)
AST: 57 U/L — AB (ref 5–34)
Albumin: 3.4 g/dL — ABNORMAL LOW (ref 3.5–5.0)
Alkaline Phosphatase: 206 U/L — ABNORMAL HIGH (ref 40–150)
Anion Gap: 8 mEq/L (ref 3–11)
BUN: 6.8 mg/dL — ABNORMAL LOW (ref 7.0–26.0)
CHLORIDE: 103 meq/L (ref 98–109)
CO2: 26 mEq/L (ref 22–29)
Calcium: 9.9 mg/dL (ref 8.4–10.4)
Creatinine: 0.6 mg/dL — ABNORMAL LOW (ref 0.7–1.3)
EGFR: >90 mL/min/{1.73_m2} (ref >90)
GLUCOSE: 108 mg/dL (ref 70–140)
POTASSIUM: 4.5 meq/L (ref 3.5–5.1)
SODIUM: 138 meq/L (ref 136–145)
Total Bilirubin: 0.26 mg/dL (ref 0.20–1.20)
Total Protein: 6.6 g/dL (ref 6.4–8.3)

## 2014-10-14 MED ORDER — MORPHINE SULFATE 15 MG PO TABS: 15.0000 mg | ORAL_TABLET | ORAL | Status: DC | PRN

## 2014-10-14 MED ORDER — PEGFILGRASTIM INJECTION 6 MG/0.6ML ~~LOC~~
6.0000 mg | PREFILLED_SYRINGE | Freq: Once | SUBCUTANEOUS | Status: AC
  Administered 2014-10-14: 6 mg via SUBCUTANEOUS
  Filled 2014-10-14: qty 0.6

## 2014-10-14 NOTE — Telephone Encounter (Signed)
s,w  pt and advised on todays appt....pt ok and aware

## 2014-10-15 ENCOUNTER — Telehealth: Payer: Self-pay | Admitting: Diagnostic Neuroimaging

## 2014-10-15 DIAGNOSIS — G40109 Localization-related (focal) (partial) symptomatic epilepsy and epileptic syndromes with simple partial seizures, not intractable, without status epilepticus: Secondary | ICD-10-CM

## 2014-10-15 NOTE — Telephone Encounter (Signed)
Spoke with [name] and advised patient needs to come in tomorrow for Dilantin lab then FU later this week. She stated they can come tomorrow morning, informed to arrive no later than 11:30 am. Scheduled FU for Thurs, 1:40 pm with Dr  Leta Baptist. Order for Dilantin level, total entered.

## 2014-10-15 NOTE — Telephone Encounter (Signed)
Patient's wife is calling and states that his cancer doctor would like to have his dilantin blood levels checked as he had an episode which might possibly be caused by low dilantin blood levels. The cancer nurse stated that the chemo drug carboplatim may be decreasing the blood level of his dilantin.  Also, it would be helpful for his Keppra 750 mg to be changed to immediate release, an oral disintegration, or oral solution as he is having a lot of trouble swallowing. Please call wife. Thanks!

## 2014-10-15 NOTE — Telephone Encounter (Signed)
pls have patient come in tomorrow AM for labs (trough dilantin level). Then setup follow up appt in a few days to review results and adjust medication dosing. -VRP

## 2014-10-16 ENCOUNTER — Other Ambulatory Visit (INDEPENDENT_AMBULATORY_CARE_PROVIDER_SITE_OTHER): Payer: Self-pay

## 2014-10-16 ENCOUNTER — Ambulatory Visit: Payer: Self-pay | Admitting: Hematology and Oncology

## 2014-10-16 ENCOUNTER — Other Ambulatory Visit: Payer: Self-pay

## 2014-10-16 ENCOUNTER — Ambulatory Visit: Payer: Self-pay

## 2014-10-16 DIAGNOSIS — Z0289 Encounter for other administrative examinations: Secondary | ICD-10-CM

## 2014-10-16 DIAGNOSIS — G40109 Localization-related (focal) (partial) symptomatic epilepsy and epileptic syndromes with simple partial seizures, not intractable, without status epilepticus: Secondary | ICD-10-CM

## 2014-10-17 ENCOUNTER — Ambulatory Visit: Payer: Self-pay

## 2014-10-17 ENCOUNTER — Ambulatory Visit (INDEPENDENT_AMBULATORY_CARE_PROVIDER_SITE_OTHER): Payer: Medicaid Other | Admitting: Diagnostic Neuroimaging

## 2014-10-17 ENCOUNTER — Encounter: Payer: Self-pay | Admitting: Diagnostic Neuroimaging

## 2014-10-17 VITALS — BP 122/78 | HR 93 | Ht 65.5 in | Wt 147.8 lb

## 2014-10-17 DIAGNOSIS — R561 Post traumatic seizures: Secondary | ICD-10-CM

## 2014-10-17 DIAGNOSIS — C3491 Malignant neoplasm of unspecified part of right bronchus or lung: Secondary | ICD-10-CM | POA: Diagnosis not present

## 2014-10-17 DIAGNOSIS — G40109 Localization-related (focal) (partial) symptomatic epilepsy and epileptic syndromes with simple partial seizures, not intractable, without status epilepticus: Secondary | ICD-10-CM | POA: Diagnosis not present

## 2014-10-17 DIAGNOSIS — C799 Secondary malignant neoplasm of unspecified site: Secondary | ICD-10-CM | POA: Diagnosis not present

## 2014-10-17 DIAGNOSIS — C7931 Secondary malignant neoplasm of brain: Secondary | ICD-10-CM

## 2014-10-17 DIAGNOSIS — C3490 Malignant neoplasm of unspecified part of unspecified bronchus or lung: Secondary | ICD-10-CM | POA: Insufficient documentation

## 2014-10-17 LAB — PHENYTOIN LEVEL, TOTAL: Phenytoin (Dilantin), Serum: 1.4 ug/mL — ABNORMAL LOW (ref 10.0–20.0)

## 2014-10-17 MED ORDER — LACOSAMIDE 50 MG PO TABS: 50.0000 mg | ORAL_TABLET | Freq: Two times a day (BID) | ORAL | Status: DC

## 2014-10-17 MED ORDER — SPRITAM 750 MG PO TB3D: 1500.0000 mg | ORAL_TABLET | Freq: Two times a day (BID) | ORAL | Status: AC
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
## 2014-10-17 NOTE — Patient Instructions (Signed)
Change levetiracetam '1500mg'$  twice a day to [place] '1500mg'$  twice a day.  Continue dilantin '300mg'$  at bedtime.  Add vimpat '50mg'$  twice a day; after 2 weeks increase to '100mg'$  twice a day.

## 2014-10-18 ENCOUNTER — Ambulatory Visit: Payer: Self-pay

## 2014-10-22 ENCOUNTER — Ambulatory Visit: Payer: Self-pay

## 2014-10-24 ENCOUNTER — Ambulatory Visit
Admission: RE | Admit: 2014-10-24 | Discharge: 2014-10-24 | Disposition: A | Discharge status: Home / Self Care | Payer: Medicaid Other | Source: Ambulatory Visit | Attending: Diagnostic Neuroimaging | Admitting: Diagnostic Neuroimaging

## 2014-10-24 ENCOUNTER — Encounter (INDEPENDENT_AMBULATORY_CARE_PROVIDER_SITE_OTHER): Payer: Medicaid Other | Admitting: Neurology

## 2014-10-24 DIAGNOSIS — C3491 Malignant neoplasm of unspecified part of right bronchus or lung: Secondary | ICD-10-CM

## 2014-10-24 DIAGNOSIS — G40109 Localization-related (focal) (partial) symptomatic epilepsy and epileptic syndromes with simple partial seizures, not intractable, without status epilepticus: Secondary | ICD-10-CM

## 2014-10-24 DIAGNOSIS — C799 Secondary malignant neoplasm of unspecified site: Secondary | ICD-10-CM

## 2014-10-24 DIAGNOSIS — C7931 Secondary malignant neoplasm of brain: Secondary | ICD-10-CM

## 2014-10-24 DIAGNOSIS — R561 Post traumatic seizures: Secondary | ICD-10-CM

## 2014-10-24 MED ORDER — GADOBENATE DIMEGLUMINE 529 MG/ML IV SOLN
14.0000 mL | Freq: Once | INTRAVENOUS | Status: AC | PRN
  Administered 2014-10-24: 14 mL via INTRAVENOUS

## 2014-10-30 ENCOUNTER — Other Ambulatory Visit: Payer: Self-pay

## 2014-10-30 ENCOUNTER — Emergency Department (HOSPITAL_COMMUNITY): Payer: Medicaid Other

## 2014-10-30 ENCOUNTER — Telehealth: Payer: Self-pay | Admitting: Hematology and Oncology

## 2014-10-30 ENCOUNTER — Emergency Department (HOSPITAL_COMMUNITY)
Admission: EM | Admit: 2014-10-30 | Discharge: 2014-10-30 | Disposition: A | Discharge status: Home / Self Care | Payer: Medicaid Other | Attending: Emergency Medicine | Admitting: Emergency Medicine

## 2014-10-30 ENCOUNTER — Encounter (HOSPITAL_COMMUNITY): Payer: Self-pay | Admitting: *Deleted

## 2014-10-30 ENCOUNTER — Ambulatory Visit: Payer: Self-pay

## 2014-10-30 ENCOUNTER — Telehealth: Payer: Self-pay | Admitting: *Deleted

## 2014-10-30 ENCOUNTER — Ambulatory Visit: Payer: Self-pay | Admitting: Hematology and Oncology

## 2014-10-30 DIAGNOSIS — Z85118 Personal history of other malignant neoplasm of bronchus and lung: Secondary | ICD-10-CM | POA: Insufficient documentation

## 2014-10-30 DIAGNOSIS — W19XXXA Unspecified fall, initial encounter: Secondary | ICD-10-CM

## 2014-10-30 DIAGNOSIS — R209 Unspecified disturbances of skin sensation: Secondary | ICD-10-CM

## 2014-10-30 DIAGNOSIS — Z79899 Other long term (current) drug therapy: Secondary | ICD-10-CM | POA: Insufficient documentation

## 2014-10-30 DIAGNOSIS — Z87828 Personal history of other (healed) physical injury and trauma: Secondary | ICD-10-CM | POA: Insufficient documentation

## 2014-10-30 DIAGNOSIS — R202 Paresthesia of skin: Secondary | ICD-10-CM | POA: Diagnosis not present

## 2014-10-30 DIAGNOSIS — Z87891 Personal history of nicotine dependence: Secondary | ICD-10-CM | POA: Diagnosis not present

## 2014-10-30 DIAGNOSIS — M79609 Pain in unspecified limb: Secondary | ICD-10-CM

## 2014-10-30 DIAGNOSIS — Z85841 Personal history of malignant neoplasm of brain: Secondary | ICD-10-CM | POA: Diagnosis not present

## 2014-10-30 DIAGNOSIS — I10 Essential (primary) hypertension: Secondary | ICD-10-CM | POA: Insufficient documentation

## 2014-10-30 DIAGNOSIS — G40909 Epilepsy, unspecified, not intractable, without status epilepticus: Secondary | ICD-10-CM | POA: Insufficient documentation

## 2014-10-30 DIAGNOSIS — R200 Anesthesia of skin: Secondary | ICD-10-CM | POA: Diagnosis present

## 2014-10-30 DIAGNOSIS — Z8505 Personal history of malignant neoplasm of liver: Secondary | ICD-10-CM | POA: Diagnosis not present

## 2014-10-30 LAB — CBC WITH DIFFERENTIAL/PLATELET
BASOS ABS: 0 10*3/uL (ref 0.0–0.1)
Basophils Relative: 0 %
Eosinophils Absolute: 0 10*3/uL (ref 0.0–0.7)
Eosinophils Relative: 0 %
HEMATOCRIT: 29.7 % — AB (ref 39.0–52.0)
Hemoglobin: 9.9 g/dL — ABNORMAL LOW (ref 13.0–17.0)
LYMPHS ABS: 0.8 10*3/uL (ref 0.7–4.0)
LYMPHS PCT: 15 %
MCH: 30.9 pg (ref 26.0–34.0)
MCHC: 33.3 g/dL (ref 30.0–36.0)
MCV: 92.8 fL (ref 78.0–100.0)
MONO ABS: 0.7 10*3/uL (ref 0.1–1.0)
Monocytes Relative: 13 %
NEUTROS ABS: 4 10*3/uL (ref 1.7–7.7)
Neutrophils Relative %: 72 %
Platelets: 102 10*3/uL — ABNORMAL LOW (ref 150–400)
RBC: 3.2 MIL/uL — AB (ref 4.22–5.81)
RDW: 19.3 % — AB (ref 11.5–15.5)
WBC: 5.5 10*3/uL (ref 4.0–10.5)

## 2014-10-30 LAB — URINALYSIS, ROUTINE W REFLEX MICROSCOPIC
Bilirubin Urine: NEGATIVE
GLUCOSE, UA: NEGATIVE mg/dL
Hgb urine dipstick: NEGATIVE
Ketones, ur: NEGATIVE mg/dL
LEUKOCYTES UA: NEGATIVE
Nitrite: NEGATIVE
PROTEIN: NEGATIVE mg/dL
SPECIFIC GRAVITY, URINE: 1.01 (ref 1.005–1.030)
Urobilinogen, UA: 0.2 mg/dL (ref 0.0–1.0)
pH: 8 (ref 5.0–8.0)

## 2014-10-30 LAB — BASIC METABOLIC PANEL
ANION GAP: 9 (ref 5–15)
BUN: 8 mg/dL (ref 6–20)
CO2: 23 mmol/L (ref 22–32)
Calcium: 9.1 mg/dL (ref 8.9–10.3)
Chloride: 103 mmol/L (ref 101–111)
Creatinine, Ser: 0.41 mg/dL — ABNORMAL LOW (ref 0.61–1.24)
GFR calc Af Amer: >60 mL/min (ref >60)
GFR calc non Af Amer: >60 mL/min (ref >60)
GLUCOSE: 99 mg/dL (ref 65–99)
POTASSIUM: 3.2 mmol/L — AB (ref 3.5–5.1)
Sodium: 135 mmol/L (ref 135–145)

## 2014-10-30 MED ORDER — DEXAMETHASONE 2 MG PO TABS: 2.0000 mg | ORAL_TABLET | Freq: Two times a day (BID) | ORAL | Status: DC

## 2014-10-30 MED ORDER — MORPHINE SULFATE 15 MG PO TABS: 15.0000 mg | ORAL_TABLET | ORAL | Status: DC | PRN

## 2014-10-30 MED ORDER — DEXAMETHASONE 4 MG PO TABS
10.0000 mg | ORAL_TABLET | Freq: Once | ORAL | Status: AC
  Administered 2014-10-30: 10 mg via ORAL
  Filled 2014-10-30: qty 2
  Filled 2014-10-30: qty 1

## 2014-10-30 MED ORDER — MORPHINE SULFATE 15 MG PO TABS
15.0000 mg | ORAL_TABLET | Freq: Once | ORAL | Status: AC
  Administered 2014-10-30: 15 mg via ORAL
  Filled 2014-10-30: qty 1

## 2014-10-30 NOTE — Telephone Encounter (Signed)
Pt left VM he has a cold and r/s his appts today to next week, but he needs a refill on his MS IR.  Rx ready to pick up.  Notified pt of rx ready.  He will send his fiance to pick it up. He reports cough and congestion.  Denies any fevers.  Instructed pt if he develops fever or any worsening symptoms to call us back so Dr. Alvy Bimler can see him.  He verbalized understanding.

## 2014-10-30 NOTE — Telephone Encounter (Signed)
returned call and gv pt new sched....pt needed this weeks appts cx due to sick...pt ok and aware of new d.t
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
## 2014-10-30 NOTE — ED Notes (Signed)
Bed: WA07 Expected date:  Expected time:  Means of arrival:  Comments: EMS- [place] Pt, new tumors/hand deformity?

## 2014-10-30 NOTE — Discharge Instructions (Signed)
Metastatic Brain Tumor °A metastatic brain tumor is a growth in your brain. It is made of cancer cells from another part of your body that traveled to your brain through your bloodstream, along the nerves of your brain (cranial nerves), or through the opening at the base of your skull.  °CAUSES  °The most common cause of a metastatic brain tumor in men is lung cancer. In women, it is breast cancer. Other common causes include: °· Unknown types of cancers. °· Skin cancer (melanoma). °· Colon cancer. °· Kidney cancer. °SIGNS AND SYMPTOMS  °Most signs and symptoms of metastatic brain tumors are caused by increased pressure inside your brain. A headache is often the first symptom. Eventually, almost everyone with a metastatic brain tumor has a headache. Other signs and symptoms include: °· Vomiting. °· Weakness or fatigue. °· Seizures. °· Sensory problems, like tingling or numbness. °· Problems walking. °· Clumsiness. °· Emotional changes. °· Personality changes. °· Changes in speech or vision. °DIAGNOSIS  °Your health care provider can diagnose a metastatic brain tumor from your medical history and physical exam. You may also have some additional tests, including: °· A nervous system function test (neurologic exam). °· Imaging studies of your brain, such as an MRI and CT scan. °· Having a small piece of tissue removed (biopsy) from your brain to be checked under a microscope. °TREATMENT  °Treatment depends on the type of cancer you have and your overall health. It also depends on the size of your tumor and the number of brain tumors you have. The most common treatments include: °· Medicines to control pain, nausea, and seizures. °· Cancer-killing drugs (chemotherapy). °· An X-ray treatment called radiation therapy to kill brain tumor cells. °· A type of radiation therapy that is targeted to exact locations in the brain (stereotactic radiotherapy). °· Surgery to remove brain tumors. °HOME CARE INSTRUCTIONS °· Only take  medicines as directed by your health care provider. °· Do not drive if: °¨ You are taking strong pain medicines. °¨ Your vision or thinking is not clear. °· Take two 10-minute walks every day if you are able. Exercising is a good way to relieve stress. It can also help you sleep. °· Be sure to get enough calories and protein in your diet. °¨ If you have a poor appetite or feel nauseous, eat small meals often. °¨ Supplement your diet with protein shakes or milk shakes. °· It is common to have anxiety and depression when you are coping with cancer. °¨ Talk to friends and loved ones about your feelings. °¨ Have a good support system at home. °SEEK MEDICAL CARE IF: °· You have chills or fever. °· Your medicine is not controlling your symptoms. °· Your symptoms get worse or you develop new symptoms. °· You are having trouble caring for yourself or being cared for at home. °· You are struggling with anxiety or depression. °SEEK IMMEDIATE MEDICAL CARE IF: °· You cannot stop vomiting. °· You cannot keep down any foods or fluids. °· You have sudden changes in speech or vision. °· You have a seizure. °· You can no longer take care of yourself at home. °Document Released: 10/29/2003 Document Revised: 06/18/2013 Document Reviewed: 01/30/2008 °[employer]® Patient Information ©2015 [employer]. This information is not intended to replace advice given to you by your health care provider. Make sure you discuss any questions you have with your health care provider. ° °

## 2014-10-30 NOTE — ED Notes (Signed)
Attempted to stick pt for bloodwork however pt requested i take the needle out. RN is aware, pt also has a port.

## 2014-10-30 NOTE — ED Notes (Signed)
Per EMS, pt complains of arm pain and numbness in his left arm for the past 2 weeks. Pt states the pain and numbness moved to the left side of his face and back at 1PM today. Pt has hx of brain, lung, liver cancer. Pt states he had a similar episode 3 weeks ago and was hospitalized.

## 2014-10-31 ENCOUNTER — Ambulatory Visit: Payer: Self-pay

## 2014-11-01 ENCOUNTER — Ambulatory Visit: Payer: Self-pay

## 2014-11-04 ENCOUNTER — Encounter: Payer: Self-pay | Admitting: Diagnostic Neuroimaging

## 2014-11-04 ENCOUNTER — Ambulatory Visit (INDEPENDENT_AMBULATORY_CARE_PROVIDER_SITE_OTHER): Payer: Medicaid Other | Admitting: Diagnostic Neuroimaging

## 2014-11-04 ENCOUNTER — Ambulatory Visit: Payer: Self-pay

## 2014-11-04 VITALS — BP 116/74 | HR 103 | Ht 65.5 in | Wt 147.2 lb

## 2014-11-04 DIAGNOSIS — G40109 Localization-related (focal) (partial) symptomatic epilepsy and epileptic syndromes with simple partial seizures, not intractable, without status epilepticus: Secondary | ICD-10-CM | POA: Diagnosis not present

## 2014-11-04 DIAGNOSIS — C799 Secondary malignant neoplasm of unspecified site: Secondary | ICD-10-CM

## 2014-11-04 DIAGNOSIS — C3491 Malignant neoplasm of unspecified part of right bronchus or lung: Secondary | ICD-10-CM | POA: Diagnosis not present

## 2014-11-04 MED ORDER — LACOSAMIDE 100 MG PO TABS: 100.0000 mg | ORAL_TABLET | Freq: Two times a day (BID) | ORAL | Status: AC
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
## 2014-11-04 NOTE — Patient Instructions (Addendum)
Continue vimpat to '100mg'$  twice a day.  Continue other medications.

## 2014-11-06 ENCOUNTER — Encounter: Payer: Self-pay | Admitting: Hematology and Oncology

## 2014-11-06 ENCOUNTER — Ambulatory Visit
Admission: RE | Admit: 2014-11-06 | Discharge: 2014-11-06 | Disposition: A | Discharge status: Home / Self Care | Payer: Medicaid Other | Source: Ambulatory Visit | Attending: Radiation Oncology | Admitting: Radiation Oncology

## 2014-11-06 ENCOUNTER — Ambulatory Visit: Payer: Medicaid Other

## 2014-11-06 ENCOUNTER — Other Ambulatory Visit: Payer: Self-pay | Admitting: Radiation Therapy

## 2014-11-06 ENCOUNTER — Other Ambulatory Visit (HOSPITAL_BASED_OUTPATIENT_CLINIC_OR_DEPARTMENT_OTHER): Payer: Medicaid Other

## 2014-11-06 ENCOUNTER — Ambulatory Visit (HOSPITAL_BASED_OUTPATIENT_CLINIC_OR_DEPARTMENT_OTHER): Payer: Medicaid Other | Admitting: Hematology and Oncology

## 2014-11-06 ENCOUNTER — Encounter: Payer: Self-pay | Admitting: Radiation Oncology

## 2014-11-06 ENCOUNTER — Telehealth: Payer: Self-pay | Admitting: Hematology and Oncology

## 2014-11-06 ENCOUNTER — Ambulatory Visit (HOSPITAL_BASED_OUTPATIENT_CLINIC_OR_DEPARTMENT_OTHER): Payer: Medicaid Other

## 2014-11-06 VITALS — BP 128/82 | HR 90 | Temp 98.2°F | Resp 18 | Ht 65.0 in | Wt 146.0 lb

## 2014-11-06 VITALS — BP 128/82 | HR 90 | Temp 98.2°F | Resp 18 | Ht 65.5 in | Wt 146.4 lb

## 2014-11-06 DIAGNOSIS — C3490 Malignant neoplasm of unspecified part of unspecified bronchus or lung: Secondary | ICD-10-CM

## 2014-11-06 DIAGNOSIS — C787 Secondary malignant neoplasm of liver and intrahepatic bile duct: Secondary | ICD-10-CM | POA: Diagnosis not present

## 2014-11-06 DIAGNOSIS — Z79899 Other long term (current) drug therapy: Secondary | ICD-10-CM | POA: Diagnosis not present

## 2014-11-06 DIAGNOSIS — F40240 Claustrophobia: Secondary | ICD-10-CM | POA: Insufficient documentation

## 2014-11-06 DIAGNOSIS — C3411 Malignant neoplasm of upper lobe, right bronchus or lung: Secondary | ICD-10-CM | POA: Diagnosis not present

## 2014-11-06 DIAGNOSIS — C7931 Secondary malignant neoplasm of brain: Secondary | ICD-10-CM

## 2014-11-06 DIAGNOSIS — D6481 Anemia due to antineoplastic chemotherapy: Secondary | ICD-10-CM

## 2014-11-06 DIAGNOSIS — T451X5A Adverse effect of antineoplastic and immunosuppressive drugs, initial encounter: Secondary | ICD-10-CM

## 2014-11-06 DIAGNOSIS — R200 Anesthesia of skin: Secondary | ICD-10-CM | POA: Insufficient documentation

## 2014-11-06 DIAGNOSIS — Z923 Personal history of irradiation: Secondary | ICD-10-CM | POA: Diagnosis not present

## 2014-11-06 DIAGNOSIS — C3491 Malignant neoplasm of unspecified part of right bronchus or lung: Secondary | ICD-10-CM

## 2014-11-06 DIAGNOSIS — C7949 Secondary malignant neoplasm of other parts of nervous system: Secondary | ICD-10-CM

## 2014-11-06 DIAGNOSIS — I10 Essential (primary) hypertension: Secondary | ICD-10-CM | POA: Insufficient documentation

## 2014-11-06 DIAGNOSIS — Z5111 Encounter for antineoplastic chemotherapy: Secondary | ICD-10-CM | POA: Diagnosis present

## 2014-11-06 DIAGNOSIS — G40909 Epilepsy, unspecified, not intractable, without status epilepticus: Secondary | ICD-10-CM | POA: Insufficient documentation

## 2014-11-06 DIAGNOSIS — R569 Unspecified convulsions: Secondary | ICD-10-CM

## 2014-11-06 DIAGNOSIS — R531 Weakness: Secondary | ICD-10-CM | POA: Diagnosis not present

## 2014-11-06 DIAGNOSIS — Z87891 Personal history of nicotine dependence: Secondary | ICD-10-CM | POA: Insufficient documentation

## 2014-11-06 DIAGNOSIS — Z510 Encounter for antineoplastic radiation therapy: Secondary | ICD-10-CM | POA: Diagnosis present

## 2014-11-06 DIAGNOSIS — K439 Ventral hernia without obstruction or gangrene: Secondary | ICD-10-CM | POA: Insufficient documentation

## 2014-11-06 LAB — CBC WITH DIFFERENTIAL/PLATELET
BASO%: 0.1 % (ref 0.0–2.0)
Basophils Absolute: 0 10*3/uL (ref 0.0–0.1)
EOS ABS: 0 10*3/uL (ref 0.0–0.5)
EOS%: 0.1 % (ref 0.0–7.0)
HEMATOCRIT: 34.3 % — AB (ref 38.4–49.9)
HGB: 11.3 g/dL — ABNORMAL LOW (ref 13.0–17.1)
LYMPH#: 1.1 10*3/uL (ref 0.9–3.3)
LYMPH%: 12.5 % — AB (ref 14.0–49.0)
MCH: 31.3 pg (ref 27.2–33.4)
MCHC: 32.9 g/dL (ref 32.0–36.0)
MCV: 95 fL (ref 79.3–98.0)
MONO#: 1 10*3/uL — AB (ref 0.1–0.9)
MONO%: 11.9 % (ref 0.0–14.0)
NEUT%: 75.4 % — AB (ref 39.0–75.0)
NEUTROS ABS: 6.6 10*3/uL — AB (ref 1.5–6.5)
PLATELETS: 256 10*3/uL (ref 140–400)
RBC: 3.61 10*6/uL — AB (ref 4.20–5.82)
RDW: 20.4 % — ABNORMAL HIGH (ref 11.0–14.6)
WBC: 8.7 10*3/uL (ref 4.0–10.3)

## 2014-11-06 LAB — COMPREHENSIVE METABOLIC PANEL (CC13)
ALK PHOS: 298 U/L — AB (ref 40–150)
ALT: 32 U/L (ref 0–55)
ANION GAP: 9 meq/L (ref 3–11)
AST: 47 U/L — ABNORMAL HIGH (ref 5–34)
Albumin: 3.7 g/dL (ref 3.5–5.0)
BUN: 7.3 mg/dL (ref 7.0–26.0)
CALCIUM: 9.6 mg/dL (ref 8.4–10.4)
CO2: 25 mEq/L (ref 22–29)
CREATININE: 0.6 mg/dL — AB (ref 0.7–1.3)
Chloride: 104 mEq/L (ref 98–109)
Glucose: 99 mg/dl (ref 70–140)
Potassium: 3.7 mEq/L (ref 3.5–5.1)
Sodium: 138 mEq/L (ref 136–145)
TOTAL PROTEIN: 6.7 g/dL (ref 6.4–8.3)

## 2014-11-06 MED ORDER — DEXAMETHASONE 2 MG PO TABS: 2.0000 mg | ORAL_TABLET | Freq: Two times a day (BID) | ORAL | Status: DC

## 2014-11-06 MED ORDER — LORAZEPAM 1 MG PO TABS: ORAL_TABLET | ORAL | Status: AC

## 2014-11-06 MED ORDER — HEPARIN SOD (PORK) LOCK FLUSH 100 UNIT/ML IV SOLN
500.0000 [IU] | Freq: Once | INTRAVENOUS | Status: AC | PRN
  Administered 2014-11-06: 500 [IU]
  Filled 2014-11-06: qty 5

## 2014-11-06 MED ORDER — SODIUM CHLORIDE 0.9 % IJ SOLN
10.0000 mL | INTRAMUSCULAR | Status: DC | PRN
  Administered 2014-11-06: 10 mL
  Filled 2014-11-06: qty 10

## 2014-11-06 MED ORDER — SODIUM CHLORIDE 0.9 % IV SOLN
Freq: Once | INTRAVENOUS | Status: AC
  Administered 2014-11-06: 14:00:00 via INTRAVENOUS

## 2014-11-06 MED ORDER — SODIUM CHLORIDE 0.9 % IV SOLN
544.1250 mg | Freq: Once | INTRAVENOUS | Status: AC
  Administered 2014-11-06: 540 mg via INTRAVENOUS
  Filled 2014-11-06: qty 54

## 2014-11-06 MED ORDER — PALONOSETRON HCL INJECTION 0.25 MG/5ML
0.2500 mg | Freq: Once | INTRAVENOUS | Status: AC
  Administered 2014-11-06: 0.25 mg via INTRAVENOUS

## 2014-11-06 MED ORDER — SODIUM CHLORIDE 0.9 % IV SOLN
100.0000 mg/m2 | Freq: Once | INTRAVENOUS | Status: AC
  Administered 2014-11-06: 190 mg via INTRAVENOUS
  Filled 2014-11-06: qty 9.5

## 2014-11-06 MED ORDER — PALONOSETRON HCL INJECTION 0.25 MG/5ML
INTRAVENOUS | Status: AC
  Filled 2014-11-06: qty 5

## 2014-11-06 MED ORDER — SODIUM CHLORIDE 0.9 % IV SOLN
10.0000 mg | Freq: Once | INTRAVENOUS | Status: AC
  Administered 2014-11-06: 10 mg via INTRAVENOUS
  Filled 2014-11-06: qty 1

## 2014-11-06 NOTE — Assessment & Plan Note (Signed)
This is likely due to recent treatment. The patient denies recent history of bleeding such as epistaxis, hematuria or hematochezia. He is asymptomatic from the anemia. I will observe for now.    

## 2014-11-06 NOTE — Assessment & Plan Note (Signed)
He is currently on multiple different medications, prescribed by neurologist and is being monitored carefully. There is plan to taper him off Dilantin in the future which would help reduce potential drug interaction with his chemotherapy. I will defer to his neurologist for medical management

## 2014-11-06 NOTE — Assessment & Plan Note (Signed)
He has significant abdominal wall hernia. I have give him prescription for abdominal wall binder. With his significant co-morbidities, he is not a surgical candidate for abdominal wall repair

## 2014-11-06 NOTE — Patient Instructions (Signed)

## 2014-11-06 NOTE — Assessment & Plan Note (Signed)
Recent MRI suggested possible disease progression and that could explain the cause of his poorly controlled seizure. I have consult the radiation oncologist to reevaluate for possibility of radiation treatment  To the brain.
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
## 2014-11-06 NOTE — Patient Instructions (Signed)
[facility] Discharge Instructions for Patients Receiving Chemotherapy  Today you received the following chemotherapy agents Etoposide/Carboplatin To help prevent nausea and vomiting after your treatment, we encourage you to take your nausea medication    If you develop nausea and vomiting that is not controlled by your nausea medication, call the clinic.   BELOW ARE SYMPTOMS THAT SHOULD BE REPORTED IMMEDIATELY:  *FEVER GREATER THAN 100.5 F  *CHILLS WITH OR WITHOUT FEVER  NAUSEA AND VOMITING THAT IS NOT CONTROLLED WITH YOUR NAUSEA MEDICATION  *UNUSUAL SHORTNESS OF BREATH  *UNUSUAL BRUISING OR BLEEDING  TENDERNESS IN MOUTH AND THROAT WITH OR WITHOUT PRESENCE OF ULCERS  *URINARY PROBLEMS  *BOWEL PROBLEMS  UNUSUAL RASH Items with * indicate a potential emergency and should be followed up as soon as possible.  Feel free to call the clinic you have any questions or concerns. The clinic phone number is [phone].  Please show the [facility] at check-in to the Emergency Department and triage nurse.

## 2014-11-06 NOTE — Telephone Encounter (Signed)
Pt confirmed labs/ov per 09/21 POF, gave pt AVS and Calendar... KJ °
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
## 2014-11-06 NOTE — Progress Notes (Signed)
See progress note under physician encounter. 

## 2014-11-06 NOTE — Assessment & Plan Note (Signed)
There is no contraindication for him to proceed with treatment today. I will proceed with cycle 4 treatment without dose adjustment. In the meantime, I will recommend consult with radiation oncologist to discuss possible SRS treatment to the brain

## 2014-11-07 ENCOUNTER — Telehealth: Payer: Self-pay | Admitting: Hematology and Oncology

## 2014-11-07 ENCOUNTER — Ambulatory Visit (HOSPITAL_BASED_OUTPATIENT_CLINIC_OR_DEPARTMENT_OTHER): Payer: Medicaid Other

## 2014-11-07 VITALS — BP 136/77 | HR 93 | Temp 97.2°F | Resp 18

## 2014-11-07 DIAGNOSIS — C3411 Malignant neoplasm of upper lobe, right bronchus or lung: Secondary | ICD-10-CM | POA: Diagnosis not present

## 2014-11-07 DIAGNOSIS — Z5111 Encounter for antineoplastic chemotherapy: Secondary | ICD-10-CM | POA: Diagnosis present

## 2014-11-07 DIAGNOSIS — C3490 Malignant neoplasm of unspecified part of unspecified bronchus or lung: Secondary | ICD-10-CM

## 2014-11-07 MED ORDER — SODIUM CHLORIDE 0.9 % IV SOLN
100.0000 mg/m2 | Freq: Once | INTRAVENOUS | Status: AC
  Administered 2014-11-07: 190 mg via INTRAVENOUS
  Filled 2014-11-07: qty 9.5

## 2014-11-07 MED ORDER — PROCHLORPERAZINE MALEATE 10 MG PO TABS
ORAL_TABLET | ORAL | Status: AC
  Filled 2014-11-07: qty 1

## 2014-11-07 MED ORDER — HEPARIN SOD (PORK) LOCK FLUSH 100 UNIT/ML IV SOLN
500.0000 [IU] | Freq: Once | INTRAVENOUS | Status: AC | PRN
  Administered 2014-11-07: 500 [IU]
  Filled 2014-11-07: qty 5

## 2014-11-07 MED ORDER — SODIUM CHLORIDE 0.9 % IV SOLN
Freq: Once | INTRAVENOUS | Status: AC
  Administered 2014-11-07: 14:00:00 via INTRAVENOUS

## 2014-11-07 MED ORDER — PROCHLORPERAZINE MALEATE 10 MG PO TABS
10.0000 mg | ORAL_TABLET | Freq: Once | ORAL | Status: AC
  Administered 2014-11-07: 10 mg via ORAL

## 2014-11-07 MED ORDER — SODIUM CHLORIDE 0.9 % IJ SOLN
10.0000 mL | INTRAMUSCULAR | Status: DC | PRN
  Administered 2014-11-07: 10 mL
  Filled 2014-11-07: qty 10

## 2014-11-07 NOTE — Telephone Encounter (Signed)
The patient is evaluated at the infusion center for rash. He has diffuse macular rash which is not present the day before. I suspect this is a drug rash. I recommend dexamethasone 2 mg 3 times a day along with Benadryl 50 mg 3 times a day. We got pictures of his rash on his cell phone. I recommend my nurse practitioner to evaluate him tomorrow prior to chemotherapy. If the rash does not resolve, I recommend holding chemotherapy.  my suspicion is the rash could be related to his recent new anti-seizure medication.

## 2014-11-07 NOTE — Patient Instructions (Signed)
[facility] Discharge Instructions for Patients Receiving Chemotherapy  Today you received the following chemotherapy agents: Etoposide  To help prevent nausea and vomiting after your treatment, we encourage you to take your nausea medication as prescribed by your physician.   If you develop nausea and vomiting that is not controlled by your nausea medication, call the clinic.   BELOW ARE SYMPTOMS THAT SHOULD BE REPORTED IMMEDIATELY:  *FEVER GREATER THAN 100.5 F  *CHILLS WITH OR WITHOUT FEVER  NAUSEA AND VOMITING THAT IS NOT CONTROLLED WITH YOUR NAUSEA MEDICATION  *UNUSUAL SHORTNESS OF BREATH  *UNUSUAL BRUISING OR BLEEDING  TENDERNESS IN MOUTH AND THROAT WITH OR WITHOUT PRESENCE OF ULCERS  *URINARY PROBLEMS  *BOWEL PROBLEMS  UNUSUAL RASH Items with * indicate a potential emergency and should be followed up as soon as possible.  Feel free to call the clinic you have any questions or concerns. The clinic phone number is [phone].  Please show the CHEMO ALERT CARD at check-in to the Emergency Department and triage nurse.   

## 2014-11-07 NOTE — Progress Notes (Signed)
Went to deaccess pt and chest and abdomen noted to have redness/rash.  I assumed care and did not see pts chest and abdomen prior to chemo infusion so nothing to compare to; however, pt stated it was all new to him.  Vitals stable as charted.  Dr. Alvy Bimler notified and assessed pt.  Pt informed to take Benadryl and Decadron as transcribed for pt.  Pt verbalized understanding and to call us if he noticed any further changes.  Pt scheduled to return tomorrow for chemo infusion and we will re-evaluate.  Pt has no further questions or concerns at time of discharge.

## 2014-11-08 ENCOUNTER — Ambulatory Visit (HOSPITAL_COMMUNITY)
Admission: RE | Admit: 2014-11-08 | Discharge: 2014-11-08 | Disposition: A | Discharge status: Home / Self Care | Payer: Medicaid Other | Source: Ambulatory Visit | Attending: Radiation Oncology | Admitting: Radiation Oncology

## 2014-11-08 ENCOUNTER — Ambulatory Visit (HOSPITAL_BASED_OUTPATIENT_CLINIC_OR_DEPARTMENT_OTHER): Payer: Medicaid Other

## 2014-11-08 ENCOUNTER — Telehealth: Payer: Self-pay | Admitting: *Deleted

## 2014-11-08 ENCOUNTER — Ambulatory Visit (HOSPITAL_BASED_OUTPATIENT_CLINIC_OR_DEPARTMENT_OTHER): Payer: Medicaid Other | Admitting: Nurse Practitioner

## 2014-11-08 ENCOUNTER — Other Ambulatory Visit: Payer: Self-pay | Admitting: *Deleted

## 2014-11-08 ENCOUNTER — Other Ambulatory Visit: Payer: Self-pay | Admitting: Radiation Oncology

## 2014-11-08 ENCOUNTER — Encounter (HOSPITAL_COMMUNITY): Payer: Medicaid Other

## 2014-11-08 VITALS — BP 128/78 | HR 88 | Temp 97.7°F | Resp 18

## 2014-11-08 DIAGNOSIS — M79632 Pain in left forearm: Secondary | ICD-10-CM | POA: Insufficient documentation

## 2014-11-08 DIAGNOSIS — C3490 Malignant neoplasm of unspecified part of unspecified bronchus or lung: Secondary | ICD-10-CM

## 2014-11-08 DIAGNOSIS — I82612 Acute embolism and thrombosis of superficial veins of left upper extremity: Secondary | ICD-10-CM | POA: Diagnosis not present

## 2014-11-08 DIAGNOSIS — Z5111 Encounter for antineoplastic chemotherapy: Secondary | ICD-10-CM

## 2014-11-08 DIAGNOSIS — R21 Rash and other nonspecific skin eruption: Secondary | ICD-10-CM

## 2014-11-08 DIAGNOSIS — C7931 Secondary malignant neoplasm of brain: Secondary | ICD-10-CM

## 2014-11-08 DIAGNOSIS — C7949 Secondary malignant neoplasm of other parts of nervous system: Secondary | ICD-10-CM

## 2014-11-08 DIAGNOSIS — C3411 Malignant neoplasm of upper lobe, right bronchus or lung: Secondary | ICD-10-CM | POA: Diagnosis not present

## 2014-11-08 MED ORDER — DIPHENHYDRAMINE HCL 50 MG/ML IJ SOLN
50.0000 mg | Freq: Once | INTRAMUSCULAR | Status: AC
  Administered 2014-11-08: 50 mg via INTRAVENOUS

## 2014-11-08 MED ORDER — PALONOSETRON HCL INJECTION 0.25 MG/5ML
0.2500 mg | Freq: Once | INTRAVENOUS | Status: AC
  Administered 2014-11-08: 0.25 mg via INTRAVENOUS

## 2014-11-08 MED ORDER — SODIUM CHLORIDE 0.9 % IV SOLN
Freq: Once | INTRAVENOUS | Status: AC
  Administered 2014-11-08: 15:00:00 via INTRAVENOUS

## 2014-11-08 MED ORDER — HEPARIN SOD (PORK) LOCK FLUSH 100 UNIT/ML IV SOLN
500.0000 [IU] | Freq: Once | INTRAVENOUS | Status: AC | PRN
  Administered 2014-11-08: 500 [IU]
  Filled 2014-11-08: qty 5

## 2014-11-08 MED ORDER — SODIUM CHLORIDE 0.9 % IJ SOLN
10.0000 mL | INTRAMUSCULAR | Status: DC | PRN
  Administered 2014-11-08: 10 mL
  Filled 2014-11-08: qty 10

## 2014-11-08 MED ORDER — DIPHENHYDRAMINE HCL 50 MG/ML IJ SOLN
INTRAMUSCULAR | Status: AC
  Filled 2014-11-08: qty 1

## 2014-11-08 MED ORDER — ETOPOSIDE CHEMO INJECTION 1 GM/50ML
100.0000 mg/m2 | Freq: Once | INTRAVENOUS | Status: AC
  Administered 2014-11-08: 190 mg via INTRAVENOUS
  Filled 2014-11-08: qty 9.5

## 2014-11-08 MED ORDER — SODIUM CHLORIDE 0.9 % IV SOLN
10.0000 mg | Freq: Once | INTRAVENOUS | Status: AC
  Administered 2014-11-08: 10 mg via INTRAVENOUS
  Filled 2014-11-08: qty 1

## 2014-11-08 MED ORDER — ONDANSETRON HCL 8 MG PO TABS
ORAL_TABLET | ORAL | Status: AC
  Filled 2014-11-08: qty 1

## 2014-11-08 MED ORDER — PALONOSETRON HCL INJECTION 0.25 MG/5ML
INTRAVENOUS | Status: AC
  Filled 2014-11-08: qty 5

## 2014-11-08 NOTE — Telephone Encounter (Signed)
Called patient to inform of [place] appt., lvm for a return call

## 2014-11-08 NOTE — Progress Notes (Signed)
1425: Pt reports "Seizure like" activity to left side, pt states this has been an on-going issue and Dr. Alvy Bimler is aware. Cameo, Dr. Calton Dach nurse made aware. Pt reports rash was cleared up "before getting home" on 11/07/14 so he reports not taking Benadryl on 11/07/14 like instructed. Cameo, Dr. Calton Dach nurse made aware.  Cyndee Bacon to infusion to assess.   No rash noted to patient's chest at time of discharge.

## 2014-11-08 NOTE — Progress Notes (Signed)
*  PRELIMINARY RESULTS* Vascular Ultrasound Left upper extremity venous duplex has been completed.  Preliminary findings: Negative for DVT.  Superficial thrombosis noted in the left cephalic vein in the forearm.  Called results to Dr. Isidore Moos.     Landry Mellow, RDMS, RVT  11/08/2014, 1:28 PM

## 2014-11-08 NOTE — Progress Notes (Signed)
Pt reports his rash resolved last night by the time he got home.  He has no visible rash today.  Notified Dr. Alvy Bimler and she instructs ok to proceed w/ Etoposide today and add Benadryl 50 mg IV pre med.  Selena Lesser also seeing pt in infusion room.  She will be available to assess for any rash or signs of reaction today.

## 2014-11-08 NOTE — Patient Instructions (Signed)
[facility] Discharge Instructions for Patients Receiving Chemotherapy  Today you received the following chemotherapy agents: Etoposide   To help prevent nausea and vomiting after your treatment, we encourage you to take your nausea medication as directed.    If you develop nausea and vomiting that is not controlled by your nausea medication, call the clinic.   BELOW ARE SYMPTOMS THAT SHOULD BE REPORTED IMMEDIATELY:  *FEVER GREATER THAN 100.5 F  *CHILLS WITH OR WITHOUT FEVER  NAUSEA AND VOMITING THAT IS NOT CONTROLLED WITH YOUR NAUSEA MEDICATION  *UNUSUAL SHORTNESS OF BREATH  *UNUSUAL BRUISING OR BLEEDING  TENDERNESS IN MOUTH AND THROAT WITH OR WITHOUT PRESENCE OF ULCERS  *URINARY PROBLEMS  *BOWEL PROBLEMS  UNUSUAL RASH Items with * indicate a potential emergency and should be followed up as soon as possible.  Feel free to call the clinic you have any questions or concerns. The clinic phone number is [phone].  Please show the CHEMO ALERT CARD at check-in to the Emergency Department and triage nurse.   

## 2014-11-09 ENCOUNTER — Ambulatory Visit: Payer: Medicaid Other

## 2014-11-10 ENCOUNTER — Encounter: Payer: Self-pay | Admitting: Nurse Practitioner

## 2014-11-10 DIAGNOSIS — R21 Rash and other nonspecific skin eruption: Secondary | ICD-10-CM | POA: Insufficient documentation

## 2014-11-10 NOTE — Assessment & Plan Note (Signed)
Patient received cycle 4, day 1 of his carboplatin/etoposide therapy regimen on 11/06/2014.  It was noted when he returned to the cancer center just yesterday for cycle 4, day 2 of the same regimen that he had developed a diffuse macular rash to his entire body.  Patient complained of some mild pruritus; but had no other allergic-type symptoms.  Patient was advised at that time to increase his dexamethasone 2 mg to 3 times per day; and also take Benadryl 50 mg 3 times a day as well.  Patient reports that his rash has almost completely resolved within the past 24 hours.  He denies any other new symptoms whatsoever.  On exam- macular rash has essentially resolved completely.  Pt continues to have some mild erythema to the left upper chest  portacath site where the most recent tape recently was. There is no edema, no warmth, no tenderness, no red streaks to the portacath site.   Previous generalized rash could very well be attributed to pt's new seizure medication.   Pt was advised to go directly to the ED over the weekend if he develops any worsening symptoms whatsoever.

## 2014-11-10 NOTE — Assessment & Plan Note (Signed)
Patient received cycle 4, day 1 of his carboplatin/etoposide therapy regimen on 11/06/2014.  It was noted when he returned to the cancer center just yesterday for cycle 4, day 2 of the same regimen that he had developed a diffuse macular rash to his entire body.  Patient complained of some mild pruritus; but had no other allergic-type symptoms.  Patient was advised at that time to increase his dexamethasone to 3 times per day; and also take Benadryl 3 times a day as well.  Patient reports that his rash has almost completely resolved within the past 24 hours.  He denies any other new symptoms whatsoever.  Patient is scheduled to return on Monday, 11/11/2014 for his Neulasta injection for growth factor support.  He is scheduled for restaging brain MRI 11/15/2014.  He is scheduled for CT simulation per radiation oncology on 11/18/2014.
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
## 2014-11-11 ENCOUNTER — Telehealth: Payer: Self-pay | Admitting: *Deleted

## 2014-11-11 ENCOUNTER — Ambulatory Visit (HOSPITAL_BASED_OUTPATIENT_CLINIC_OR_DEPARTMENT_OTHER): Payer: Medicaid Other

## 2014-11-11 ENCOUNTER — Other Ambulatory Visit: Payer: Self-pay | Admitting: Hematology and Oncology

## 2014-11-11 ENCOUNTER — Telehealth: Payer: Self-pay | Admitting: Hematology and Oncology

## 2014-11-11 ENCOUNTER — Ambulatory Visit (HOSPITAL_COMMUNITY)
Admission: RE | Admit: 2014-11-11 | Discharge: 2014-11-11 | Disposition: A | Discharge status: Home / Self Care | Payer: Medicaid Other | Source: Ambulatory Visit | Attending: Radiation Oncology | Admitting: Radiation Oncology

## 2014-11-11 VITALS — BP 123/80 | HR 90 | Temp 98.4°F

## 2014-11-11 DIAGNOSIS — C3490 Malignant neoplasm of unspecified part of unspecified bronchus or lung: Secondary | ICD-10-CM

## 2014-11-11 DIAGNOSIS — Z5189 Encounter for other specified aftercare: Secondary | ICD-10-CM | POA: Diagnosis present

## 2014-11-11 DIAGNOSIS — C3411 Malignant neoplasm of upper lobe, right bronchus or lung: Secondary | ICD-10-CM | POA: Diagnosis not present

## 2014-11-11 MED ORDER — PEGFILGRASTIM INJECTION 6 MG/0.6ML ~~LOC~~
6.0000 mg | PREFILLED_SYRINGE | Freq: Once | SUBCUTANEOUS | Status: AC
  Administered 2014-11-11: 6 mg via SUBCUTANEOUS
  Filled 2014-11-11: qty 0.6

## 2014-11-11 NOTE — Telephone Encounter (Signed)
Left message for callback to check on patient re: rash after VP-16 infusions.

## 2014-11-11 NOTE — Telephone Encounter (Signed)
lvm for pt regarding to OCT appt..... °

## 2014-11-11 NOTE — Telephone Encounter (Signed)
Per staff message and POF I have scheduled appts. Advised scheduler of appts. JMW  

## 2014-11-13 ENCOUNTER — Other Ambulatory Visit: Payer: Self-pay

## 2014-11-13 ENCOUNTER — Telehealth: Payer: Self-pay | Admitting: *Deleted

## 2014-11-13 MED ORDER — MORPHINE SULFATE 15 MG PO TABS: 15.0000 mg | ORAL_TABLET | ORAL | Status: DC | PRN

## 2014-11-13 NOTE — Telephone Encounter (Signed)
Informed fiance of [facility] IR rx ready to pick up.  Pt will pick up when he comes for his MRI this Friday.

## 2014-11-15 ENCOUNTER — Ambulatory Visit
Admission: RE | Admit: 2014-11-15 | Discharge: 2014-11-15 | Disposition: A | Discharge status: Home / Self Care | Payer: Medicaid Other | Source: Ambulatory Visit | Attending: Radiation Oncology | Admitting: Radiation Oncology

## 2014-11-15 DIAGNOSIS — C7931 Secondary malignant neoplasm of brain: Secondary | ICD-10-CM

## 2014-11-15 MED ORDER — GADOBENATE DIMEGLUMINE 529 MG/ML IV SOLN
13.0000 mL | Freq: Once | INTRAVENOUS | Status: AC | PRN
  Administered 2014-11-15: 13 mL via INTRAVENOUS

## 2014-11-18 ENCOUNTER — Ambulatory Visit
Admission: RE | Admit: 2014-11-18 | Discharge: 2014-11-18 | Disposition: A | Discharge status: Home / Self Care | Payer: Medicaid Other | Source: Ambulatory Visit | Attending: Radiation Oncology | Admitting: Radiation Oncology

## 2014-11-18 DIAGNOSIS — Z510 Encounter for antineoplastic radiation therapy: Secondary | ICD-10-CM | POA: Diagnosis not present

## 2014-11-18 DIAGNOSIS — C7931 Secondary malignant neoplasm of brain: Secondary | ICD-10-CM

## 2014-11-18 MED ORDER — HEPARIN SOD (PORK) LOCK FLUSH 100 UNIT/ML IV SOLN
500.0000 [IU] | Freq: Once | INTRAVENOUS | Status: AC
  Administered 2014-11-18: 500 [IU] via INTRAVENOUS

## 2014-11-18 MED ORDER — SODIUM CHLORIDE 0.9 % IJ SOLN: 10.0000 mL | INTRAMUSCULAR | Status: DC | PRN

## 2014-11-18 MED ORDER — SODIUM CHLORIDE 0.9 % IJ SOLN
10.0000 mL | Freq: Once | INTRAMUSCULAR | Status: AC
  Administered 2014-11-18: 10 mL via INTRAVENOUS

## 2014-11-18 MED ORDER — HEPARIN SOD (PORK) LOCK FLUSH 100 UNIT/ML IV SOLN: 500.0000 [IU] | Freq: Once | INTRAVENOUS | Status: DC

## 2014-11-18 NOTE — Progress Notes (Signed)
Patient tolerated CT Simulation well, deacessed left port a cath, after flushing with 55m normal saline and then 559m500units/ml, heparin flush per protocol, baqndaid applied over site, and printed schedule for patient to return for treatment this Friday,pt ambulated steady gait  out 2:07 PM

## 2014-11-18 NOTE — Progress Notes (Addendum)
  Name: [name] MRN: [id number]  Date: 11/18/2014  DOB: [birth year]  SIMULATION AND TREATMENT PLANNING NOTE Outpatient    ICD-9-CM ICD-10-CM   1. Metastasis to brain (HCC) 198.3 C79.31 sodium chloride 0.9 % injection 10 mL     heparin lock flush 100 unit/mL     NARRATIVE:  The patient was brought to the [facility].  Identity was confirmed.  All relevant records and images related to the planned course of therapy were reviewed.  The patient freely provided informed written consent to proceed with treatment after reviewing the details related to the planned course of therapy. The consent form was witnessed and verified by the simulation staff. Intravenous access was established for contrast administration. Then, the patient was set-up in a stable reproducible supine position for radiation therapy.  A relocatable thermoplastic stereotactic head frame was fabricated for precise immobilization - one complex treatment device.  CT images were obtained with contrast.  Surface markings were placed.  The CT images were loaded into the planning software and fused with the patient's targeting MRI scan.  Then the target and avoidance structures were contoured.  Treatment planning then occurred.  The radiation prescription was entered and confirmed.  I have requested 3D planning  I have requested a DVH of the following structures: Brain stem, brain, left eye, right eye, lenses, optic chiasm, target volumes, uninvolved brain, and normal tissue.    PLAN:  The patient will receive 20 Gy in 1 fraction with stereotactic radiosurgery to his two brain lesions.  SPECIAL TREATMENT PROCEDURE NOTE:  This constitutes a special treatment procedure due to the ablative dose that will be delivered and the technical nature of treatment.  This highly technical modality of treatment ensures that the ablative dose is centered on the patient's tumor while sparing normal tissues from excessive dose and risk of  detrimental effects.  -----------------------------------  Eppie Gibson, MD

## 2014-11-19 ENCOUNTER — Ambulatory Visit: Payer: Self-pay | Admitting: Diagnostic Neuroimaging

## 2014-11-20 NOTE — Addendum Note (Signed)
Encounter addended by: Eppie Gibson, MD on: 11/20/2014 11:13 AM<BR>     Documentation filed: Notes Section

## 2014-11-21 DIAGNOSIS — Z510 Encounter for antineoplastic radiation therapy: Secondary | ICD-10-CM | POA: Diagnosis not present

## 2014-11-22 ENCOUNTER — Encounter: Payer: Self-pay | Admitting: Radiation Oncology

## 2014-11-22 ENCOUNTER — Ambulatory Visit
Admission: RE | Admit: 2014-11-22 | Discharge: 2014-11-22 | Disposition: A | Discharge status: Home / Self Care | Payer: Medicaid Other | Source: Ambulatory Visit | Attending: Radiation Oncology | Admitting: Radiation Oncology

## 2014-11-22 VITALS — BP 120/74 | HR 100 | Temp 98.3°F

## 2014-11-22 DIAGNOSIS — C7949 Secondary malignant neoplasm of other parts of nervous system: Principal | ICD-10-CM

## 2014-11-22 DIAGNOSIS — C7931 Secondary malignant neoplasm of brain: Secondary | ICD-10-CM

## 2014-11-22 DIAGNOSIS — Z510 Encounter for antineoplastic radiation therapy: Secondary | ICD-10-CM | POA: Diagnosis not present

## 2014-11-22 NOTE — Progress Notes (Signed)
Patient for assessment post SRS brain x 1 treatment to brain.No nausea or vomiting.pain controlled with msir.Current dose of dexamethasone is 2 mg bid.Given taper by Dr.Squire.Scheduled for follow up 01/03/15.[name] has instructions. BP 120/74 mmHg  Pulse 100  Temp(Src) 98.3 F (36.8 C)  SpO2 100%  Ambulated with patient out to front entrance.

## 2014-11-22 NOTE — Progress Notes (Signed)
  Radiation Oncology         [phone]) [phone] ________________________________  Stereotactic Treatment Procedure Note (Outpatient)  Name: Nahshon Reich MRN: [id number]  Date: 11/22/2014  DOB: [birth year]  SPECIAL TREATMENT PROCEDURE  3D TREATMENT PLANNING AND DOSIMETRY:  The patient's radiation plan was reviewed and approved by neurosurgery and radiation oncology prior to treatment.  It showed 3-dimensional radiation distributions overlaid onto the planning CT/MRI image set.  The [facility] for the target structures as well as the organs at risk were reviewed. The documentation of the 3D plan and dosimetry are filed in the radiation oncology EMR.  NARRATIVE:  Elba Dendinger was brought to the TrueBeam stereotactic radiation treatment machine and placed supine on the CT couch. The head frame was applied, and the patient was set up for stereotactic radiosurgery.  Neurosurgery was present for the set-up and delivery  SIMULATION VERIFICATION:  In the couch zero-angle position, the patient underwent Exactrac imaging using the Brainlab system with orthogonal KV images.  These were carefully aligned and repeated to confirm treatment position for each of the isocenters.  The Exactrac snap film verification was repeated at each couch angle.  SPECIAL TREATMENT PROCEDURE: [name] received stereotactic radiosurgery to the following targets:  Left Superior Temporal 99m target target was treated using 5 Dynamic Conformal Arcs to a prescription dose of 20 Gy.  ExacTrac Snap verification was performed for each couch angle.  Right Superior Parietal 944mtarget was treated using 4 Dynamic Conformal Arcs to a prescription dose of 20 Gy.  ExacTrac Snap verification was performed for each couch angle.   This constitutes a special treatment procedure due to the ablative dose delivered and the technical nature of treatment.  This highly technical modality of treatment ensures that the ablative dose is centered on the patient's  tumor while sparing normal tissues from excessive dose and risk of detrimental effects.  STEREOTACTIC TREATMENT MANAGEMENT:  Following delivery, the patient was transported to nursing in stable condition and monitored for possible acute effects.  Vital signs were recorded BP 125/80 mmHg  Pulse 97  Temp(Src) 98.2 F (36.8 C)  SpO2 100%. The patient tolerated treatment without significant acute effects, and was discharged to home in stable condition.    PLAN: Follow-up in one month. On Oct 28, taper to '2mg'$  of Decadron (Dexamethasone) daily (1 tablet daily).  On Nov 11, taper to '2mg'$  of Decadron every other day.  Stop taking this medication on Nov 25. ________________________________   SaEppie GibsonMD

## 2014-11-22 NOTE — Patient Instructions (Signed)
On Oct 28, taper to '2mg'$  of Decadron (Dexamethasone) daily (1 tablet daily).  On Nov 11, taper to '2mg'$  of Decadron every other day.  Stop taking this medication on Nov 25.

## 2014-11-22 NOTE — Progress Notes (Signed)
  Radiation Oncology         [phone] ________________________________  Name: [name] MRN: [id number]  Date: 11/22/2014  DOB: 12-07-1970  End of Treatment Note  Diagnosis:   Small cell lung cancer with metastases to brain    Indication for treatment:  Salvage, palliative       Radiation treatment dates:   11-22-14  Site/dose/Beams/energy :   Left Superior Temporal 44m target  was treated using 5 Dynamic Conformal Arcs to a prescription dose of 20 Gy.    Right Superior Parietal 96mtarget was treated using 4 Dynamic Conformal Arcs to a prescription dose of 20 Gy.     6FFF photons were used, SRS technique   Narrative: The patient tolerated radiation treatment relatively well.     Plan: The patient has completed radiation treatment. The patient will return to radiation oncology clinic for routine followup in one month. I advised them to call or return sooner if they have any questions or concerns related to their recovery or treatment.  -----------------------------------  SaEppie GibsonMD

## 2014-11-24 ENCOUNTER — Other Ambulatory Visit: Payer: Self-pay | Admitting: Diagnostic Neuroimaging

## 2014-11-26 NOTE — Op Note (Signed)
  Name: [name]  MRN: [id number]  Date: 11/22/2014   DOB: [birth year]  Stereotactic Radiosurgery Operative Note  PRE-OPERATIVE DIAGNOSIS:  Multiple Brain Metastases  POST-OPERATIVE DIAGNOSIS:  Multiple Brain Metastases  PROCEDURE:  Stereotactic Radiosurgery  SURGEON:  Jairo Ben, MD  RADIATION ONCOLOGIST: Dr. Eppie Gibson, MD  NARRATIVE: The patient underwent a radiation treatment planning session in the radiation oncology simulation suite under the care of the radiation oncology physician and physicist.  I participated closely in the radiation treatment planning afterwards. The patient underwent planning CT which was fused to 3T high resolution MRI with 1 mm axial slices.  These images were fused on the planning system.  We contoured the gross target volumes and subsequently expanded this to yield the Planning Target Volume. I actively participated in the planning process.  I helped to define and review the target contours and also the contours of the optic pathway, eyes, brainstem and selected nearby organs at risk.  All the dose constraints for critical structures were reviewed and compared to AAPM Task Group 101.  The prescription dose conformity was reviewed.  I approved the plan electronically.    Accordingly, [name] was brought to the TrueBeam stereotactic radiation treatment linac and placed in the custom immobilization mask.  The patient was aligned according to the IR fiducial markers with BrainLab Exactrac, then orthogonal x-rays were used in ExacTrac with the 6DOF robotic table and the shifts were made to align the patient  [name] received stereotactic radiosurgery uneventfully.    Lesions treated:  2   Left sup temporal 20Gy  Right parietal 20Gy  The detailed description of the procedure is recorded in the radiation oncology procedure note.  I was present for the duration of the procedure.  DISPOSITION:  Following delivery, the patient was transported to  nursing in stable condition and monitored for possible acute effects to be discharged to home in stable condition with follow-up in one month.  Consuella Lose, Loletha Grayer, MD 11/26/2014 6:05 PM

## 2014-11-26 NOTE — Addendum Note (Signed)
Encounter addended by: Consuella Lose, MD on: 11/26/2014  6:06 PM<BR>     Documentation filed: Notes Section

## 2014-11-27 ENCOUNTER — Other Ambulatory Visit: Payer: Self-pay | Admitting: *Deleted

## 2014-11-27 ENCOUNTER — Other Ambulatory Visit (HOSPITAL_BASED_OUTPATIENT_CLINIC_OR_DEPARTMENT_OTHER): Payer: Medicaid Other

## 2014-11-27 ENCOUNTER — Ambulatory Visit (HOSPITAL_BASED_OUTPATIENT_CLINIC_OR_DEPARTMENT_OTHER): Payer: Medicaid Other

## 2014-11-27 ENCOUNTER — Telehealth: Payer: Self-pay | Admitting: Hematology and Oncology

## 2014-11-27 ENCOUNTER — Ambulatory Visit: Payer: Medicaid Other

## 2014-11-27 ENCOUNTER — Encounter: Payer: Self-pay | Admitting: Hematology and Oncology

## 2014-11-27 ENCOUNTER — Ambulatory Visit (HOSPITAL_BASED_OUTPATIENT_CLINIC_OR_DEPARTMENT_OTHER): Payer: Medicaid Other | Admitting: Hematology and Oncology

## 2014-11-27 VITALS — BP 125/76 | HR 93 | Temp 98.0°F | Resp 18 | Ht 65.0 in | Wt 145.0 lb

## 2014-11-27 DIAGNOSIS — Z5111 Encounter for antineoplastic chemotherapy: Secondary | ICD-10-CM | POA: Diagnosis present

## 2014-11-27 DIAGNOSIS — Z95828 Presence of other vascular implants and grafts: Secondary | ICD-10-CM

## 2014-11-27 DIAGNOSIS — D61810 Antineoplastic chemotherapy induced pancytopenia: Secondary | ICD-10-CM | POA: Diagnosis not present

## 2014-11-27 DIAGNOSIS — M8448XA Pathological fracture, other site, initial encounter for fracture: Secondary | ICD-10-CM | POA: Diagnosis not present

## 2014-11-27 DIAGNOSIS — R748 Abnormal levels of other serum enzymes: Secondary | ICD-10-CM | POA: Diagnosis not present

## 2014-11-27 DIAGNOSIS — C3411 Malignant neoplasm of upper lobe, right bronchus or lung: Secondary | ICD-10-CM | POA: Diagnosis present

## 2014-11-27 DIAGNOSIS — I808 Phlebitis and thrombophlebitis of other sites: Secondary | ICD-10-CM | POA: Diagnosis not present

## 2014-11-27 DIAGNOSIS — T451X5A Adverse effect of antineoplastic and immunosuppressive drugs, initial encounter: Secondary | ICD-10-CM

## 2014-11-27 DIAGNOSIS — C3491 Malignant neoplasm of unspecified part of right bronchus or lung: Secondary | ICD-10-CM

## 2014-11-27 DIAGNOSIS — M545 Low back pain: Secondary | ICD-10-CM

## 2014-11-27 DIAGNOSIS — R569 Unspecified convulsions: Secondary | ICD-10-CM

## 2014-11-27 DIAGNOSIS — C3490 Malignant neoplasm of unspecified part of unspecified bronchus or lung: Secondary | ICD-10-CM

## 2014-11-27 LAB — CBC WITH DIFFERENTIAL/PLATELET
BASO%: 0 % (ref 0.0–2.0)
BASOS ABS: 0 10*3/uL (ref 0.0–0.1)
EOS ABS: 0 10*3/uL (ref 0.0–0.5)
EOS%: 0 % (ref 0.0–7.0)
HEMATOCRIT: 31 % — AB (ref 38.4–49.9)
HEMOGLOBIN: 10.3 g/dL — AB (ref 13.0–17.1)
LYMPH#: 0.7 10*3/uL — AB (ref 0.9–3.3)
LYMPH%: 8.5 % — ABNORMAL LOW (ref 14.0–49.0)
MCH: 32.1 pg (ref 27.2–33.4)
MCHC: 33.2 g/dL (ref 32.0–36.0)
MCV: 96.6 fL (ref 79.3–98.0)
MONO#: 0.9 10*3/uL (ref 0.1–0.9)
MONO%: 10.7 % (ref 0.0–14.0)
NEUT%: 80.8 % — ABNORMAL HIGH (ref 39.0–75.0)
NEUTROS ABS: 7.1 10*3/uL — AB (ref 1.5–6.5)
PLATELETS: 93 10*3/uL — AB (ref 140–400)
RBC: 3.21 10*6/uL — ABNORMAL LOW (ref 4.20–5.82)
RDW: 20.6 % — AB (ref 11.0–14.6)
WBC: 8.7 10*3/uL (ref 4.0–10.3)

## 2014-11-27 LAB — COMPREHENSIVE METABOLIC PANEL (CC13)
ALBUMIN: 3.1 g/dL — AB (ref 3.5–5.0)
ALK PHOS: 350 U/L — AB (ref 40–150)
ALT: 81 U/L — ABNORMAL HIGH (ref 0–55)
AST: 59 U/L — ABNORMAL HIGH (ref 5–34)
Anion Gap: 9 mEq/L (ref 3–11)
BUN: 7.5 mg/dL (ref 7.0–26.0)
CALCIUM: 9.4 mg/dL (ref 8.4–10.4)
CO2: 24 mEq/L (ref 22–29)
Chloride: 103 mEq/L (ref 98–109)
Creatinine: 0.6 mg/dL — ABNORMAL LOW (ref 0.7–1.3)
Glucose: 122 mg/dl (ref 70–140)
POTASSIUM: 3.6 meq/L (ref 3.5–5.1)
Sodium: 137 mEq/L (ref 136–145)
TOTAL PROTEIN: 6.8 g/dL (ref 6.4–8.3)

## 2014-11-27 MED ORDER — SODIUM CHLORIDE 0.9 % IV SOLN
Freq: Once | INTRAVENOUS | Status: AC
  Administered 2014-11-27: 11:00:00 via INTRAVENOUS

## 2014-11-27 MED ORDER — SODIUM CHLORIDE 0.9 % IJ SOLN
10.0000 mL | INTRAMUSCULAR | Status: DC | PRN
  Administered 2014-11-27: 10 mL
  Filled 2014-11-27: qty 10

## 2014-11-27 MED ORDER — ALTEPLASE 2 MG IJ SOLR
2.0000 mg | Freq: Once | INTRAMUSCULAR | Status: DC | PRN
Start: 2014-11-27 — End: 2014-11-27
  Filled 2014-11-27: qty 2

## 2014-11-27 MED ORDER — HEPARIN SOD (PORK) LOCK FLUSH 100 UNIT/ML IV SOLN
500.0000 [IU] | Freq: Once | INTRAVENOUS | Status: AC | PRN
  Filled 2014-11-27: qty 5

## 2014-11-27 MED ORDER — SODIUM CHLORIDE 0.9 % IJ SOLN
3.0000 mL | INTRAMUSCULAR | Status: DC | PRN
  Filled 2014-11-27: qty 10

## 2014-11-27 MED ORDER — SODIUM CHLORIDE 0.9 % IJ SOLN
10.0000 mL | INTRAMUSCULAR | Status: DC | PRN
  Administered 2014-11-27: 10 mL via INTRAVENOUS
  Filled 2014-11-27: qty 10

## 2014-11-27 MED ORDER — HEPARIN SOD (PORK) LOCK FLUSH 100 UNIT/ML IV SOLN
250.0000 [IU] | Freq: Once | INTRAVENOUS | Status: AC | PRN
  Administered 2014-11-27: 500 [IU]
  Filled 2014-11-27: qty 5

## 2014-11-27 MED ORDER — SODIUM CHLORIDE 0.9 % IV SOLN
Freq: Once | INTRAVENOUS | Status: AC
  Administered 2014-11-27: 11:00:00 via INTRAVENOUS
  Filled 2014-11-27: qty 8

## 2014-11-27 MED ORDER — MORPHINE SULFATE 30 MG PO TABS: 30.0000 mg | ORAL_TABLET | ORAL | Status: DC | PRN

## 2014-11-27 MED ORDER — SODIUM CHLORIDE 0.9 % IV SOLN
540.0000 mg | Freq: Once | INTRAVENOUS | Status: AC
  Administered 2014-11-27: 540 mg via INTRAVENOUS
  Filled 2014-11-27: qty 54

## 2014-11-27 MED ORDER — SODIUM CHLORIDE 0.9 % IV SOLN
100.0000 mg/m2 | Freq: Once | INTRAVENOUS | Status: AC
  Administered 2014-11-27: 190 mg via INTRAVENOUS
  Filled 2014-11-27: qty 9.5

## 2014-11-27 MED ORDER — SODIUM CHLORIDE 0.9 % IV SOLN: 725.5000 mg | Freq: Once | INTRAVENOUS | Status: DC

## 2014-11-27 NOTE — Assessment & Plan Note (Signed)
He had recent compression fractures and have chronic back pain. His pain is poorly controlled. I will increase the dose of IM morphine to 30 mg tablets to take as needed.

## 2014-11-27 NOTE — Assessment & Plan Note (Signed)
Cause unknown, likely related to medications. As above, I plan to order staging CT scan to assess response to treatment and to exclude progression of disease in the liver

## 2014-11-27 NOTE — Assessment & Plan Note (Signed)
There is no contraindication for him to proceed with treatment today. I will proceed with cycle 5 treatment with dose adjustment.  He had received recent [facility] treatment. I will order CT scan of the chest, abdomen and pelvis before cycle 6 to assess response to treatment.

## 2014-11-27 NOTE — Telephone Encounter (Signed)
per pof tos ch pt appt-gave pt copy of avs-sent MW email to sch trmt-pt to get updated sch and on MY CHART

## 2014-11-27 NOTE — Patient Instructions (Signed)

## 2014-11-27 NOTE — Progress Notes (Signed)
OK to treat per platelet count of 93 per Dr Alvy Bimler- Marlon Pel RN
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
## 2014-11-27 NOTE — Assessment & Plan Note (Signed)
Recent ultrasound showed no evidence of DVT. I recommend ibuprofen as needed for a week to take with food

## 2014-11-27 NOTE — Assessment & Plan Note (Signed)
This is likely due to recent treatment. The patient denies recent history of bleeding such as epistaxis, hematuria or hematochezia. He is asymptomatic from the low platelet count. I will observe for now.

## 2014-11-27 NOTE — Assessment & Plan Note (Signed)
He is currently on multiple different medications, prescribed by neurologist and is being monitored carefully. There is plan to taper him off Dilantin in the future which would help reduce potential drug interaction with his chemotherapy. I will defer to his neurologist for medical management

## 2014-11-28 ENCOUNTER — Ambulatory Visit (HOSPITAL_BASED_OUTPATIENT_CLINIC_OR_DEPARTMENT_OTHER): Payer: Medicaid Other

## 2014-11-28 ENCOUNTER — Telehealth: Payer: Self-pay | Admitting: *Deleted

## 2014-11-28 VITALS — BP 108/68 | HR 88 | Temp 98.7°F | Resp 18

## 2014-11-28 DIAGNOSIS — C3411 Malignant neoplasm of upper lobe, right bronchus or lung: Secondary | ICD-10-CM | POA: Diagnosis not present

## 2014-11-28 DIAGNOSIS — Z5111 Encounter for antineoplastic chemotherapy: Secondary | ICD-10-CM | POA: Diagnosis not present

## 2014-11-28 DIAGNOSIS — C3490 Malignant neoplasm of unspecified part of unspecified bronchus or lung: Secondary | ICD-10-CM

## 2014-11-28 MED ORDER — PROCHLORPERAZINE MALEATE 10 MG PO TABS
ORAL_TABLET | ORAL | Status: AC
  Filled 2014-11-28: qty 1

## 2014-11-28 MED ORDER — HEPARIN SOD (PORK) LOCK FLUSH 100 UNIT/ML IV SOLN
500.0000 [IU] | Freq: Once | INTRAVENOUS | Status: AC | PRN
  Administered 2014-11-28: 500 [IU]
  Filled 2014-11-28: qty 5

## 2014-11-28 MED ORDER — SODIUM CHLORIDE 0.9 % IV SOLN
Freq: Once | INTRAVENOUS | Status: AC
  Administered 2014-11-28: 15:00:00 via INTRAVENOUS

## 2014-11-28 MED ORDER — SODIUM CHLORIDE 0.9 % IJ SOLN
10.0000 mL | INTRAMUSCULAR | Status: DC | PRN
  Administered 2014-11-28: 10 mL
  Filled 2014-11-28: qty 10

## 2014-11-28 MED ORDER — PROCHLORPERAZINE MALEATE 10 MG PO TABS
10.0000 mg | ORAL_TABLET | Freq: Once | ORAL | Status: AC
  Administered 2014-11-28: 10 mg via ORAL

## 2014-11-28 MED ORDER — ETOPOSIDE CHEMO INJECTION 1 GM/50ML
100.0000 mg/m2 | Freq: Once | INTRAVENOUS | Status: AC
  Administered 2014-11-28: 190 mg via INTRAVENOUS
  Filled 2014-11-28: qty 9.5

## 2014-11-28 NOTE — Telephone Encounter (Signed)
Per staff message and POF I have scheduled appts. Advised scheduler of appts. JMW  

## 2014-11-28 NOTE — Patient Instructions (Signed)
[facility] Discharge Instructions for Patients Receiving Chemotherapy  Today you received the following chemotherapy agents: Etoposide   To help prevent nausea and vomiting after your treatment, we encourage you to take your nausea medication as directed.    If you develop nausea and vomiting that is not controlled by your nausea medication, call the clinic.   BELOW ARE SYMPTOMS THAT SHOULD BE REPORTED IMMEDIATELY:  *FEVER GREATER THAN 100.5 F  *CHILLS WITH OR WITHOUT FEVER  NAUSEA AND VOMITING THAT IS NOT CONTROLLED WITH YOUR NAUSEA MEDICATION  *UNUSUAL SHORTNESS OF BREATH  *UNUSUAL BRUISING OR BLEEDING  TENDERNESS IN MOUTH AND THROAT WITH OR WITHOUT PRESENCE OF ULCERS  *URINARY PROBLEMS  *BOWEL PROBLEMS  UNUSUAL RASH Items with * indicate a potential emergency and should be followed up as soon as possible.  Feel free to call the clinic you have any questions or concerns. The clinic phone number is [phone].  Please show the CHEMO ALERT CARD at check-in to the Emergency Department and triage nurse.   

## 2014-11-28 NOTE — Progress Notes (Signed)
Patient became angry during assessment.  Asked why I was asking "all these questions."  Explained to patient that we ask these questions to make sure it is safe to give their chemotherapy.

## 2014-11-29 ENCOUNTER — Ambulatory Visit (HOSPITAL_BASED_OUTPATIENT_CLINIC_OR_DEPARTMENT_OTHER): Payer: Medicaid Other

## 2014-11-29 VITALS — BP 118/69 | HR 95 | Temp 98.0°F

## 2014-11-29 DIAGNOSIS — C3411 Malignant neoplasm of upper lobe, right bronchus or lung: Secondary | ICD-10-CM | POA: Diagnosis not present

## 2014-11-29 DIAGNOSIS — Z5111 Encounter for antineoplastic chemotherapy: Secondary | ICD-10-CM | POA: Diagnosis present

## 2014-11-29 DIAGNOSIS — C3490 Malignant neoplasm of unspecified part of unspecified bronchus or lung: Secondary | ICD-10-CM

## 2014-11-29 MED ORDER — PROCHLORPERAZINE MALEATE 10 MG PO TABS
10.0000 mg | ORAL_TABLET | Freq: Once | ORAL | Status: AC
Start: 2014-11-29 — End: 2014-11-29
  Administered 2014-11-29: 10 mg via ORAL

## 2014-11-29 MED ORDER — HEPARIN SOD (PORK) LOCK FLUSH 100 UNIT/ML IV SOLN
500.0000 [IU] | Freq: Once | INTRAVENOUS | Status: AC | PRN
  Administered 2014-11-29: 500 [IU]
  Filled 2014-11-29: qty 5

## 2014-11-29 MED ORDER — SODIUM CHLORIDE 0.9 % IV SOLN
100.0000 mg/m2 | Freq: Once | INTRAVENOUS | Status: AC
  Administered 2014-11-29: 190 mg via INTRAVENOUS
  Filled 2014-11-29: qty 9.5

## 2014-11-29 MED ORDER — SODIUM CHLORIDE 0.9 % IJ SOLN
10.0000 mL | INTRAMUSCULAR | Status: DC | PRN
  Administered 2014-11-29: 10 mL
  Filled 2014-11-29: qty 10

## 2014-11-29 MED ORDER — SODIUM CHLORIDE 0.9 % IV SOLN
Freq: Once | INTRAVENOUS | Status: AC
  Administered 2014-11-29: 14:00:00 via INTRAVENOUS

## 2014-11-29 MED ORDER — PROCHLORPERAZINE MALEATE 10 MG PO TABS
ORAL_TABLET | ORAL | Status: AC
  Filled 2014-11-29: qty 1

## 2014-11-29 NOTE — Patient Instructions (Signed)
[place] Discharge Instructions for Patients Receiving Chemotherapy  Today you received the following chemotherapy agents: Etoposide.  To help prevent nausea and vomiting after your treatment, we encourage you to take your nausea medication: Compazine 10 mg every 6 hours as needed and Zofran 8 mg every 8 hours as needed.   If you develop nausea and vomiting that is not controlled by your nausea medication, call the clinic.   BELOW ARE SYMPTOMS THAT SHOULD BE REPORTED IMMEDIATELY:  *FEVER GREATER THAN 100.5 F  *CHILLS WITH OR WITHOUT FEVER  NAUSEA AND VOMITING THAT IS NOT CONTROLLED WITH YOUR NAUSEA MEDICATION  *UNUSUAL SHORTNESS OF BREATH  *UNUSUAL BRUISING OR BLEEDING  TENDERNESS IN MOUTH AND THROAT WITH OR WITHOUT PRESENCE OF ULCERS  *URINARY PROBLEMS  *BOWEL PROBLEMS  UNUSUAL RASH Items with * indicate a potential emergency and should be followed up as soon as possible.  Feel free to call the clinic you have any questions or concerns. The clinic phone number is [phone].  Please show the [place] at check-in to the Emergency Department and triage nurse.

## 2014-12-02 ENCOUNTER — Ambulatory Visit (HOSPITAL_BASED_OUTPATIENT_CLINIC_OR_DEPARTMENT_OTHER): Payer: Medicaid Other

## 2014-12-02 VITALS — BP 120/77 | HR 88 | Temp 97.9°F

## 2014-12-02 DIAGNOSIS — C3411 Malignant neoplasm of upper lobe, right bronchus or lung: Secondary | ICD-10-CM | POA: Diagnosis not present

## 2014-12-02 DIAGNOSIS — Z5189 Encounter for other specified aftercare: Secondary | ICD-10-CM

## 2014-12-02 DIAGNOSIS — C3490 Malignant neoplasm of unspecified part of unspecified bronchus or lung: Secondary | ICD-10-CM

## 2014-12-02 MED ORDER — PEGFILGRASTIM INJECTION 6 MG/0.6ML ~~LOC~~
6.0000 mg | PREFILLED_SYRINGE | Freq: Once | SUBCUTANEOUS | Status: AC
  Administered 2014-12-02: 6 mg via SUBCUTANEOUS
  Filled 2014-12-02: qty 0.6

## 2014-12-04 ENCOUNTER — Other Ambulatory Visit: Payer: Self-pay | Admitting: Radiation Oncology

## 2014-12-12 ENCOUNTER — Other Ambulatory Visit: Payer: Self-pay | Admitting: *Deleted

## 2014-12-12 MED ORDER — MORPHINE SULFATE 30 MG PO TABS: 30.0000 mg | ORAL_TABLET | ORAL | Status: DC | PRN

## 2014-12-12 NOTE — Telephone Encounter (Signed)
Pt states needs refill on MS IR 30 mg.  Informed him Rx will be ready to pick up today.  He verbalized understanding and will pick up today or tomorrow.

## 2014-12-16 ENCOUNTER — Ambulatory Visit: Payer: Self-pay | Admitting: Diagnostic Neuroimaging

## 2014-12-16 ENCOUNTER — Other Ambulatory Visit (HOSPITAL_BASED_OUTPATIENT_CLINIC_OR_DEPARTMENT_OTHER): Payer: Medicaid Other

## 2014-12-16 ENCOUNTER — Ambulatory Visit (HOSPITAL_COMMUNITY): Payer: Medicaid Other

## 2014-12-16 DIAGNOSIS — C3491 Malignant neoplasm of unspecified part of right bronchus or lung: Secondary | ICD-10-CM

## 2014-12-16 DIAGNOSIS — C3411 Malignant neoplasm of upper lobe, right bronchus or lung: Secondary | ICD-10-CM | POA: Diagnosis not present

## 2014-12-16 LAB — CBC WITH DIFFERENTIAL/PLATELET
BASO%: 0.1 % (ref 0.0–2.0)
Basophils Absolute: 0 10*3/uL (ref 0.0–0.1)
EOS%: 0 % (ref 0.0–7.0)
Eosinophils Absolute: 0 10*3/uL (ref 0.0–0.5)
HEMATOCRIT: 28.8 % — AB (ref 38.4–49.9)
HEMOGLOBIN: 9.7 g/dL — AB (ref 13.0–17.1)
LYMPH#: 0.7 10*3/uL — AB (ref 0.9–3.3)
LYMPH%: 4 % — ABNORMAL LOW (ref 14.0–49.0)
MCH: 32.3 pg (ref 27.2–33.4)
MCHC: 33.7 g/dL (ref 32.0–36.0)
MCV: 96 fL (ref 79.3–98.0)
MONO#: 1.4 10*3/uL — AB (ref 0.1–0.9)
MONO%: 8.2 % (ref 0.0–14.0)
NEUT%: 87.7 % — AB (ref 39.0–75.0)
NEUTROS ABS: 14.9 10*3/uL — AB (ref 1.5–6.5)
Platelets: 91 10*3/uL — ABNORMAL LOW (ref 140–400)
RBC: 3 10*6/uL — ABNORMAL LOW (ref 4.20–5.82)
RDW: 21.5 % — ABNORMAL HIGH (ref 11.0–14.6)
WBC: 17 10*3/uL — ABNORMAL HIGH (ref 4.0–10.3)
nRBC: 0 % (ref 0–0)

## 2014-12-16 LAB — COMPREHENSIVE METABOLIC PANEL (CC13)
ALT: 69 U/L — ABNORMAL HIGH (ref 0–55)
ANION GAP: 10 meq/L (ref 3–11)
AST: 70 U/L — ABNORMAL HIGH (ref 5–34)
Albumin: 2.7 g/dL — ABNORMAL LOW (ref 3.5–5.0)
Alkaline Phosphatase: 518 U/L — ABNORMAL HIGH (ref 40–150)
BILIRUBIN TOTAL: 0.53 mg/dL (ref 0.20–1.20)
BUN: 11.2 mg/dL (ref 7.0–26.0)
CHLORIDE: 101 meq/L (ref 98–109)
CO2: 27 meq/L (ref 22–29)
Calcium: 10 mg/dL (ref 8.4–10.4)
Creatinine: 0.6 mg/dL — ABNORMAL LOW (ref 0.7–1.3)
GLUCOSE: 144 mg/dL — AB (ref 70–140)
POTASSIUM: 3.8 meq/L (ref 3.5–5.1)
SODIUM: 138 meq/L (ref 136–145)
Total Protein: 7.3 g/dL (ref 6.4–8.3)

## 2014-12-17 ENCOUNTER — Ambulatory Visit (HOSPITAL_COMMUNITY)
Admission: RE | Admit: 2014-12-17 | Discharge: 2014-12-17 | Disposition: A | Discharge status: Home / Self Care | Payer: Medicaid Other | Source: Ambulatory Visit | Attending: Hematology and Oncology | Admitting: Hematology and Oncology

## 2014-12-17 DIAGNOSIS — C787 Secondary malignant neoplasm of liver and intrahepatic bile duct: Secondary | ICD-10-CM | POA: Diagnosis not present

## 2014-12-17 DIAGNOSIS — R590 Localized enlarged lymph nodes: Secondary | ICD-10-CM | POA: Insufficient documentation

## 2014-12-17 DIAGNOSIS — C786 Secondary malignant neoplasm of retroperitoneum and peritoneum: Secondary | ICD-10-CM | POA: Diagnosis not present

## 2014-12-17 DIAGNOSIS — C3490 Malignant neoplasm of unspecified part of unspecified bronchus or lung: Secondary | ICD-10-CM

## 2014-12-17 DIAGNOSIS — Z08 Encounter for follow-up examination after completed treatment for malignant neoplasm: Secondary | ICD-10-CM | POA: Diagnosis present

## 2014-12-17 MED ORDER — IOHEXOL 300 MG/ML  SOLN
100.0000 mL | Freq: Once | INTRAMUSCULAR | Status: AC | PRN
  Administered 2014-12-17: 100 mL via INTRAVENOUS

## 2014-12-18 ENCOUNTER — Ambulatory Visit (HOSPITAL_BASED_OUTPATIENT_CLINIC_OR_DEPARTMENT_OTHER): Payer: Medicaid Other | Admitting: Hematology and Oncology

## 2014-12-18 ENCOUNTER — Encounter: Payer: Self-pay | Admitting: Hematology and Oncology

## 2014-12-18 ENCOUNTER — Telehealth: Payer: Self-pay | Admitting: *Deleted

## 2014-12-18 ENCOUNTER — Ambulatory Visit: Payer: Medicaid Other

## 2014-12-18 ENCOUNTER — Other Ambulatory Visit: Payer: Medicaid Other

## 2014-12-18 VITALS — BP 106/69 | HR 98 | Temp 98.1°F | Resp 17 | Ht 65.0 in | Wt 139.6 lb

## 2014-12-18 DIAGNOSIS — Z515 Encounter for palliative care: Secondary | ICD-10-CM | POA: Diagnosis not present

## 2014-12-18 DIAGNOSIS — G893 Neoplasm related pain (acute) (chronic): Secondary | ICD-10-CM | POA: Diagnosis not present

## 2014-12-18 DIAGNOSIS — C3490 Malignant neoplasm of unspecified part of unspecified bronchus or lung: Secondary | ICD-10-CM | POA: Diagnosis present

## 2014-12-18 DIAGNOSIS — R569 Unspecified convulsions: Secondary | ICD-10-CM | POA: Diagnosis not present

## 2014-12-18 DIAGNOSIS — C787 Secondary malignant neoplasm of liver and intrahepatic bile duct: Secondary | ICD-10-CM | POA: Diagnosis not present

## 2014-12-18 DIAGNOSIS — C3491 Malignant neoplasm of unspecified part of right bronchus or lung: Secondary | ICD-10-CM

## 2014-12-18 MED ORDER — HYDROMORPHONE HCL 4 MG PO TABS: 4.0000 mg | ORAL_TABLET | ORAL | Status: DC | PRN

## 2014-12-18 NOTE — Telephone Encounter (Signed)
LVM with [name] at [facility] for [place] per Dr. Alvy Bimler.

## 2014-12-18 NOTE — Assessment & Plan Note (Signed)
Unfortunately, the patient had progressed on recent treatment. He has significant metastatic disease to the liver with impaired liver function. 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 The patient had reduced appetite, progressive fatigue and has diffuse pain. Overall, his prognosis is poor due to disease progression while on aggressive chemotherapy. I had a frank discussion with the patient and his girlfriend. I recommend referral to palliative care and hospice. The patient may derive some benefit with palliative radiation to the right upper lobe hilar region due to compression atelectasis. I will consult radiation oncologist to discuss for possible palliative radiation therapy.

## 2014-12-18 NOTE — Telephone Encounter (Signed)
Received call back/ confirmation from [facility] at [facility] they got the referral for Palliative Care.

## 2014-12-18 NOTE — Assessment & Plan Note (Signed)
He has liver function tests impairment and numerous new liver metastasis.  As above, I recommend palliative care and hospice rather than pursue further systemic treatment

## 2014-12-18 NOTE — Assessment & Plan Note (Signed)
We discussed poor prognosis due to aggressive disease progression while on combination chemotherapy. He has decline in performance status and poor quality of life. I recommend stopping chemotherapy and referring to hospice and he agreed to proceed

## 2014-12-18 NOTE — Assessment & Plan Note (Signed)
He is currently on multiple different medications, prescribed by neurologist and is being monitored carefully. I will defer to his neurologist for medical management

## 2014-12-18 NOTE — Assessment & Plan Note (Signed)
His pain is poorly controlled. As above, I will enroll him with palliative care and hospice. I am switching his pain medicine from IR morphine to Dilaudid due to impaired liver function
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
## 2014-12-19 ENCOUNTER — Ambulatory Visit: Payer: Medicaid Other

## 2014-12-20 ENCOUNTER — Other Ambulatory Visit: Payer: Self-pay | Admitting: *Deleted

## 2014-12-20 ENCOUNTER — Ambulatory Visit: Payer: Medicaid Other

## 2014-12-20 MED ORDER — AMOXICILLIN-POT CLAVULANATE 875-125 MG PO TABS: 1.0000 | ORAL_TABLET | Freq: Two times a day (BID) | ORAL | Status: DC

## 2014-12-20 MED ORDER — ONDANSETRON HCL 8 MG PO TABS
ORAL_TABLET | ORAL | Status: AC
  Filled 2014-12-20: qty 1

## 2014-12-20 MED ORDER — PREDNISONE 20 MG PO TABS: 60.0000 mg | ORAL_TABLET | Freq: Every day | ORAL | Status: DC

## 2014-12-20 NOTE — Telephone Encounter (Signed)
Pt and Kristin notified of prednisone and augmentin X 7 days.  Pt confirmed that Hospice came to see him

## 2014-12-24 ENCOUNTER — Telehealth: Payer: Self-pay | Admitting: *Deleted

## 2014-12-24 ENCOUNTER — Encounter: Payer: Self-pay | Admitting: Diagnostic Neuroimaging

## 2014-12-24 ENCOUNTER — Ambulatory Visit (INDEPENDENT_AMBULATORY_CARE_PROVIDER_SITE_OTHER): Payer: Medicaid Other | Admitting: Diagnostic Neuroimaging

## 2014-12-24 VITALS — BP 109/64 | HR 92 | Ht 65.0 in | Wt 143.0 lb

## 2014-12-24 DIAGNOSIS — C7931 Secondary malignant neoplasm of brain: Secondary | ICD-10-CM

## 2014-12-24 DIAGNOSIS — G40109 Localization-related (focal) (partial) symptomatic epilepsy and epileptic syndromes with simple partial seizures, not intractable, without status epilepticus: Secondary | ICD-10-CM | POA: Diagnosis not present
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
## 2014-12-24 NOTE — Telephone Encounter (Signed)
Left message that radiation is not recommended. Should be Hospice

## 2014-12-24 NOTE — Patient Instructions (Signed)
-  continue current medications

## 2014-12-25 ENCOUNTER — Telehealth: Payer: Self-pay | Admitting: *Deleted

## 2014-12-25 ENCOUNTER — Other Ambulatory Visit: Payer: Self-pay | Admitting: Hematology and Oncology

## 2014-12-25 MED ORDER — OXYCODONE HCL 10 MG PO TABS: 10.0000 mg | ORAL_TABLET | ORAL | Status: DC | PRN

## 2014-12-25 MED ORDER — FENTANYL 25 MCG/HR TD PT72: 25.0000 ug | MEDICATED_PATCH | TRANSDERMAL | Status: AC

## 2014-12-25 NOTE — Telephone Encounter (Signed)
Collaborative reports she has called Hospice and left message for patient.  Awaiting return call from patient.

## 2014-12-25 NOTE — Telephone Encounter (Signed)
"  This is the fourth or fifth time I've called and keep going back to this crazy stuff.  Ask Dr. Alvy Bimler or nurse to call me."

## 2014-12-25 NOTE — Telephone Encounter (Signed)
What number and what are the issues?

## 2014-12-25 NOTE — Telephone Encounter (Signed)
VM received from both [facility] and from Patient Self.   I called [facility] back and they request order from Dr. Alvy Bimler to admit to [facility] care per recommendation from Billey Chang, NP.   Gave order ok from Dr. Alvy Bimler for [place] [facility] Admission.   Called pt back and left him a VM to return nurse's call.  He did not say what his question was on his message earlier.

## 2014-12-25 NOTE — Telephone Encounter (Signed)
Voicemail from Amy with [facility] requesting return call from collaborative nurse about this patient.

## 2014-12-25 NOTE — Telephone Encounter (Signed)
Hospice called back to report pt did not want to be admitted yet as he is going out of town and wants to wait until he gets back.  Notified Dr. Alvy Bimler and she asks for nurse to encourage pt to enroll in hospice before he goes out of town.   Pt called back from previous messages.  He says he is going to the [facility] and he wasn't up to Hospice coming this afternoon.  He r/s Hospice admission visit for next Thursday.  Encouraged pt to enroll sooner, but he really just wants to go to the [place] and then "deal with it" when he returns.   The reason he called earlier was to report nausea with taking Hydromorphone.  He has taken it 4 times and felt very nauseated each time.   He understands he is not to take the morphine anymore due to it being "bad for my liver."  He asks if Dr. Alvy Bimler prescribe anything else? Informed Dr. Alvy Bimler and she prescribed Fentanyl patch and Oxycodone.  Instructed pt on using Patch, placing on upper arms and changing every 3 days.  He may take Oxycodone 10 mg every 4 hrs as needed for pain in addition to the pain patch. Instructed pt to put his other pain meds in safe place or dispose of safely so he won't mix them up and make sure they are out of reach of any children.  He verbalized understanding and his Fiance will come pick up the Rxs.

## 2014-12-31 ENCOUNTER — Other Ambulatory Visit: Payer: Self-pay | Admitting: Hematology and Oncology

## 2015-01-01 ENCOUNTER — Other Ambulatory Visit: Payer: Self-pay | Admitting: Hematology and Oncology

## 2015-01-01 ENCOUNTER — Telehealth: Payer: Self-pay | Admitting: *Deleted

## 2015-01-01 DIAGNOSIS — C3490 Malignant neoplasm of unspecified part of unspecified bronchus or lung: Secondary | ICD-10-CM

## 2015-01-01 MED ORDER — MORPHINE SULFATE 30 MG PO TABS: 30.0000 mg | ORAL_TABLET | ORAL | Status: AC | PRN

## 2015-01-01 NOTE — Telephone Encounter (Signed)
[name] called to say patient is afraid to use fentanyl patches. Ran out of MS 30 mg and tried oxycodone without any relief. Would like to resume MS 30 if OK with Dr Alvy Bimler.

## 2015-01-01 NOTE — Telephone Encounter (Signed)
The reason was switching is because of imminent liver compromise from cancer and nausea If he finds relief, I am ok for him to just use morphine. Please check status of hospice. If he has signed up, they can manage his pain better

## 2015-01-03 ENCOUNTER — Ambulatory Visit
Admission: RE | Admit: 2015-01-03 | Discharge: 2015-01-03 | Disposition: A | Discharge status: Home / Self Care | Payer: Medicaid Other | Source: Ambulatory Visit | Attending: Radiation Oncology | Admitting: Radiation Oncology

## 2015-01-03 ENCOUNTER — Encounter: Payer: Self-pay | Admitting: Radiation Oncology

## 2015-01-03 VITALS — BP 111/70 | HR 102 | Temp 98.3°F | Ht 65.0 in | Wt 136.7 lb

## 2015-01-03 DIAGNOSIS — C7931 Secondary malignant neoplasm of brain: Secondary | ICD-10-CM

## 2015-01-03 DIAGNOSIS — C7949 Secondary malignant neoplasm of other parts of nervous system: Principal | ICD-10-CM

## 2015-01-03 NOTE — Progress Notes (Signed)
Mr. Hara here today for one month FU.  Reports that appetite has not been good for two weeks.   Having SOB mostly at anytime  O2 sat is 97 % today.  Pain level is 6 now.  C/O chest discomfort when taking Oxycodone.    Energy level is low goes to bed early.   Weight down by 7 weights since last visit.  Reports that he is beginning to have some swallowing problems over the past week.  Noticed that he if getting forgetful.  BP 111/70 mmHg  Pulse 102  Temp(Src) 98.3 F (36.8 C) (Oral)  Ht '5\' 5"'$  (1.651 m)  Wt 136 lb 11.2 oz (62.007 kg)  BMI 22.75 kg/m2  SpO2 97%  Wt Readings from Last 3 Encounters:  01/03/15 136 lb 11.2 oz (62.007 kg)  12/24/14 143 lb (64.864 kg)  12/18/14 139 lb 9.6 oz (63.322 kg)

## 2015-01-03 NOTE — Progress Notes (Addendum)
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  He received 35 Gy in 14 fractions to the whole brain 05/23/2014-06/14/2014 by Dr KSondra Comefor his brain metastases with initial regression of disease.  Narrative:  The patient returns today for routine follow-up. Reports that has appetite has been poor for two weeks. He reports SOB. He reports pain as a 6 out of 10. C/o left chest discomfort/pain when taking Oxycodone. He has a poor energy level and goes to bed early. Weight down by 7 lbs since last visit. Reports that he is beginning to have some swallowing problems over the past week. The patient and his significant other state that he had a productive cough with green sputum. Reports he was given antibiotics and prednisone that helped. 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 However, he reports that these last for less time than they had prior to radiation. Finishing decadron taper. He anticipates hospice enrollment soon  ALLERGIES:  has No Known Allergies.  Meds: Current Outpatient Prescriptions  Medication Sig Dispense Refill  . acetaminophen (TYLENOL) 500 MG tablet Take 500 mg by mouth every 6 (six) hours as needed for headache.    . Cholecalciferol (VITAMIN D3) 2000 UNITS TABS Take 2,000 Units by mouth daily. 30 tablet 11  . dexamethasone (DECADRON) 2 MG tablet Take 1 tablet (2 mg total) by mouth 2 (two) times daily. Taper as directed by Dr SIsidore Moos 60 tablet 0  . Lacosamide 100 MG TABS Take 1 tablet (100 mg total) by mouth 2 (two) times daily.  60 tablet 5  . LORazepam (ATIVAN) 1 MG tablet Take 1 tablet 30 min before MRIs or wearing radiosurgery mask 6 tablet 0  . morphine (MSIR) 30 MG tablet Take 1 tablet (30 mg total) by mouth every 4 (four) hours as needed for severe pain. 90 tablet 0  . phenytoin (DILANTIN) 100 MG ER capsule TAKE 3 CAPSULES (300 MG TOTAL) BY MOUTH AT BEDTIME. 90 capsule 11  . SPRITAM 750 MG TB3D Take 1,500 mg by mouth 2 (two) times daily. 120 each 12  . diphenhydrAMINE-zinc acetate (BENADRYL) cream Apply topically 3 (three) times daily as needed for itching. (Patient not taking: Reported on 01/03/2015) 28.4 g 0  . fentaNYL (DURAGESIC - DOSED MCG/HR) 25 MCG/HR patch Place 1 patch (25 mcg total) onto the skin every 3 (three) days. (Patient not taking: Reported on 01/03/2015) 5 patch 0   No current facility-administered medications for this encounter.    Physical Findings: The patient is in no acute distress. Patient is alert and oriented.  height is '5\' 5"'$  (1.651 m) and weight is 136 lb 11.2 oz (62.007 kg). His oral temperature is 98.3 F (36.8 C). His blood pressure is 111/70 and his pulse is 102. His oxygen saturation is 97%.  No oral thrush. EOMI. No facial swelling or cyanosis. No cyanosis or telangiosis over his chest. Lungs are clear to auscultation bilaterally. Heart has regular rate and rhythm. No edema of his extremities. No obvious cranial nerve deficits. Patient walks independently.  Lab Findings: Lab Results  Component Value Date   WBC 17.0* 12/16/2014   HGB 9.7* 12/16/2014   HCT 28.8* 12/16/2014   MCV 96.0 12/16/2014   PLT 91* 12/16/2014    Radiographic Findings: Ct Chest W Contrast  12/17/2014  ADDENDUM REPORT: 12/17/2014 17:07 ADDENDUM: Findings conveyed tocall oncologist [place], MDon 12/17/2014  at16 55. Electronically Signed   By: Suzy Bouchard M.D.   On: 12/17/2014 17:07  12/17/2014  CLINICAL DATA:  Small cell lung cancer carcinoma. Subsequent treatment evaluation. Colon resection.  Chemotherapy in progress. EXAM: CT CHEST, ABDOMEN, AND PELVIS WITH CONTRAST TECHNIQUE: Multidetector CT imaging of the chest, abdomen and pelvis was performed following the standard protocol during bolus administration of intravenous contrast. CONTRAST:  169m OMNIPAQUE IOHEXOL 300 MG/ML  SOLN COMPARISON:  CT chest abdomen pelvis 09/02/2014 FINDINGS: CT CHEST FINDINGS Mediastinum/Nodes: No axillary lymphadenopathy. Interval enlargement of bulky RIGHT paratracheal adenopathy. Conglomerate of nodes in the RIGHT paracolic tracheal location measuring 3.5 x 7.0 cm (image 15, series 2) compared to 5.6 by 2.2 cm. RIGHT lower paratracheal nodal mass measures 29 mm short axis increased 19. Subcarinal adenopathy is also increased. There is a small pericardial effusion is mildly increased. Lungs/Pleura: Nodule along the mediastinal border of the RIGHT upper lobe measures 22 x 19 mm compared to 26 x 20 mm for no change (image 17, series 4). Peribronchial thickening at the hilum (image 21, series 4) similar. The nodular densities in the RIGHT upper lobe decreased significantly. Example 12 mm lesion on image 15, series 4 decreased from 23 mm. Within the lung bases there is new bibasilar nodular airspace opacities with consolidation in the LEFT lower lobe. This is concerning for lymphangitic spread of carcinoma. Musculoskeletal: No aggressive osseous lesion. CT ABDOMEN PELVIS FINDINGS Hepatobiliary: Unfortunately, there is marked progression of hepatic metastasis with multiple new lesions within LEFT RIGHT hepatic lobe. There approximately 20 lesions per lobe ranging size from 1 cm to 2 cm. New lesion in the LEFT hepatic lobe measures 30 mm x 24 mm on image 47, series 2. New lesion in the inferior RIGHT hepatic lobe measures 16 mm by 20 mm on image 69, series 2. Subcapsular lesion in the anterior LEFT hepatic lobe measures 59 30 mm on image 60, series 2. No biliary duct dilatation.  Gallbladder is normal. Pancreas: Pancreas is  normal. No ductal dilatation. No pancreatic inflammation. Spleen: Normal small Adrenals/Urinary Tract: Adrenal glands normal. The kidneys, ureters, bladder normal. Stomach/Bowel: Stomach, small bowel, appendix and cecum are normal. There is a LEFT lower quadrant colostomy without evidence of obstruction. Vascular/Lymphatic: Mild periportal adenopathy. 16 mm lymph node on image 59, series 2 compares to 20 mm. Lymph node in the gastrohepatic ligament measures 11 mm and is borderline pathologic no pelvic lymphadenopathy. Reproductive: Or prostate Other: Small peritoneal metastasis anterior the spleen measuring 6 mm on image 63, series 2). Metastasis within the fat anterior to the LEFT hepatic lobe on (image 47, series 2) Musculoskeletal: Compression fracture of L2 unchanged. There is a vertical fracture through the midbody at this level also unchanged. No aggressive osseous lesion. IMPRESSION: Chest Impression: 1. Interval increase in bulky mediastinal lymphadenopathy. 2. New nodular airspace pattern the lower lobes with some consolidation is concerning for pulmonary carcinomatosis versus multilobar pneumonia. Drug reaction seem unlikely with only lower lobe involved. With elevated white blood cell count, pneumonia is a concern. 3. Improvement in RIGHT upper lobe parenchymal nodules. Abdomen / Pelvis Impression: 1. Unfortunately marked progression of hepatic metastasis with approximately 40 new  lesions within the liver. 2. New mild peritoneal metastasis. Electronically Signed: By: Suzy Bouchard M.D. On: 12/17/2014 16:40   Ct Abdomen Pelvis W Contrast  12/17/2014  ADDENDUM REPORT: 12/17/2014 17:07 ADDENDUM: Findings conveyed tocall oncologist [place], MDon 12/17/2014  at16 55. Electronically Signed   By: Suzy Bouchard M.D.   On: 12/17/2014 17:07  12/17/2014  CLINICAL DATA:  Small cell lung cancer carcinoma. Subsequent treatment evaluation. Colon resection. Chemotherapy in progress. EXAM: CT CHEST, ABDOMEN, AND  PELVIS WITH CONTRAST TECHNIQUE: Multidetector CT imaging of the chest, abdomen and pelvis was performed following the standard protocol during bolus administration of intravenous contrast. CONTRAST:  131m OMNIPAQUE IOHEXOL 300 MG/ML  SOLN COMPARISON:  CT chest abdomen pelvis 09/02/2014 FINDINGS: CT CHEST FINDINGS Mediastinum/Nodes: No axillary lymphadenopathy. Interval enlargement of bulky RIGHT paratracheal adenopathy. Conglomerate of nodes in the RIGHT paracolic tracheal location measuring 3.5 x 7.0 cm (image 15, series 2) compared to 5.6 by 2.2 cm. RIGHT lower paratracheal nodal mass measures 29 mm short axis increased 19. Subcarinal adenopathy is also increased. There is a small pericardial effusion is mildly increased. Lungs/Pleura: Nodule along the mediastinal border of the RIGHT upper lobe measures 22 x 19 mm compared to 26 x 20 mm for no change (image 17, series 4). Peribronchial thickening at the hilum (image 21, series 4) similar. The nodular densities in the RIGHT upper lobe decreased significantly. Example 12 mm lesion on image 15, series 4 decreased from 23 mm. Within the lung bases there is new bibasilar nodular airspace opacities with consolidation in the LEFT lower lobe. This is concerning for lymphangitic spread of carcinoma. Musculoskeletal: No aggressive osseous lesion. CT ABDOMEN PELVIS FINDINGS Hepatobiliary: Unfortunately, there is marked progression of hepatic metastasis with multiple new lesions within LEFT RIGHT hepatic lobe. There approximately 20 lesions per lobe ranging size from 1 cm to 2 cm. New lesion in the LEFT hepatic lobe measures 30 mm x 24 mm on image 47, series 2. New lesion in the inferior RIGHT hepatic lobe measures 16 mm by 20 mm on image 69, series 2. Subcapsular lesion in the anterior LEFT hepatic lobe measures 59 30 mm on image 60, series 2. No biliary duct dilatation.  Gallbladder is normal. Pancreas: Pancreas is normal. No ductal dilatation. No pancreatic inflammation.  Spleen: Normal small Adrenals/Urinary Tract: Adrenal glands normal. The kidneys, ureters, bladder normal. Stomach/Bowel: Stomach, small bowel, appendix and cecum are normal. There is a LEFT lower quadrant colostomy without evidence of obstruction. Vascular/Lymphatic: Mild periportal adenopathy. 16 mm lymph node on image 59, series 2 compares to 20 mm. Lymph node in the gastrohepatic ligament measures 11 mm and is borderline pathologic no pelvic lymphadenopathy. Reproductive: Or prostate Other: Small peritoneal metastasis anterior the spleen measuring 6 mm on image 63, series 2). Metastasis within the fat anterior to the LEFT hepatic lobe on (image 47, series 2) Musculoskeletal: Compression fracture of L2 unchanged. There is a vertical fracture through the midbody at this level also unchanged. No aggressive osseous lesion. IMPRESSION: Chest Impression: 1. Interval increase in bulky mediastinal lymphadenopathy. 2. New nodular airspace pattern the lower lobes with some consolidation is concerning for pulmonary carcinomatosis versus multilobar pneumonia. Drug reaction seem unlikely with only lower lobe involved. With elevated white blood cell count, pneumonia is a concern. 3. Improvement in RIGHT upper lobe parenchymal nodules. Abdomen / Pelvis Impression: 1. Unfortunately marked progression of hepatic metastasis with approximately 40 new lesions within the liver. 2. New mild peritoneal metastasis. Electronically Signed: By: SHelane GuntherD.  On: 12/17/2014 16:40    Impression/Plan: Recovering from brain SRS.  Progressive chest disease. The pt's cough has improved with ABX.  Looking at him today, I believe he is a good hospice candidate; but since he could live for months, he will be at risk for SVC syndrome from his upper mediastinal mass upon review of his chest CT scan today. I believe he is a good candidate for radiation to the upper mediastinum to prevent SVC syndrome.  CT simulation of the chest with  contrast has been scheduled on Monday 11/21. The patient signed a consent form and this was placed in his medical chart. I anticipate 5 palliative fractions to his upper chest.  He is enthusiastic to proceed  _____________________________________   Eppie Gibson, MD  This document serves as a record of services personally performed by Eppie Gibson, MD. It was created on her behalf by Darcus Austin, a trained medical scribe. The creation of this record is based on the scribe's personal observations and the provider's statements to them. This document has been checked and approved by the attending provider.

## 2015-01-04 ENCOUNTER — Emergency Department (HOSPITAL_COMMUNITY)
Admission: EM | Admit: 2015-01-04 | Discharge: 2015-01-04 | Disposition: A | Discharge status: Home / Self Care | Payer: Medicaid Other | Attending: Emergency Medicine | Admitting: Emergency Medicine

## 2015-01-04 ENCOUNTER — Emergency Department (HOSPITAL_COMMUNITY): Payer: Medicaid Other

## 2015-01-04 ENCOUNTER — Encounter (HOSPITAL_COMMUNITY): Payer: Self-pay | Admitting: *Deleted

## 2015-01-04 DIAGNOSIS — R1032 Left lower quadrant pain: Secondary | ICD-10-CM | POA: Diagnosis not present

## 2015-01-04 DIAGNOSIS — Z79899 Other long term (current) drug therapy: Secondary | ICD-10-CM | POA: Diagnosis not present

## 2015-01-04 DIAGNOSIS — R109 Unspecified abdominal pain: Secondary | ICD-10-CM

## 2015-01-04 DIAGNOSIS — Z87828 Personal history of other (healed) physical injury and trauma: Secondary | ICD-10-CM | POA: Diagnosis not present

## 2015-01-04 DIAGNOSIS — R1012 Left upper quadrant pain: Secondary | ICD-10-CM | POA: Diagnosis not present

## 2015-01-04 DIAGNOSIS — C7931 Secondary malignant neoplasm of brain: Secondary | ICD-10-CM | POA: Diagnosis not present

## 2015-01-04 DIAGNOSIS — K5900 Constipation, unspecified: Secondary | ICD-10-CM | POA: Diagnosis not present

## 2015-01-04 DIAGNOSIS — Z85118 Personal history of other malignant neoplasm of bronchus and lung: Secondary | ICD-10-CM | POA: Insufficient documentation

## 2015-01-04 DIAGNOSIS — Z8719 Personal history of other diseases of the digestive system: Secondary | ICD-10-CM | POA: Diagnosis not present

## 2015-01-04 DIAGNOSIS — I10 Essential (primary) hypertension: Secondary | ICD-10-CM | POA: Insufficient documentation

## 2015-01-04 DIAGNOSIS — R110 Nausea: Secondary | ICD-10-CM | POA: Diagnosis not present

## 2015-01-04 DIAGNOSIS — Z87891 Personal history of nicotine dependence: Secondary | ICD-10-CM | POA: Insufficient documentation

## 2015-01-04 DIAGNOSIS — Z9049 Acquired absence of other specified parts of digestive tract: Secondary | ICD-10-CM | POA: Diagnosis not present

## 2015-01-04 DIAGNOSIS — C787 Secondary malignant neoplasm of liver and intrahepatic bile duct: Secondary | ICD-10-CM | POA: Insufficient documentation

## 2015-01-04 LAB — URINALYSIS, ROUTINE W REFLEX MICROSCOPIC
GLUCOSE, UA: NEGATIVE mg/dL
HGB URINE DIPSTICK: NEGATIVE
KETONES UR: NEGATIVE mg/dL
Nitrite: NEGATIVE
PH: 6 (ref 5.0–8.0)
PROTEIN: NEGATIVE mg/dL
Specific Gravity, Urine: 1.022 (ref 1.005–1.030)

## 2015-01-04 LAB — CBC WITH DIFFERENTIAL/PLATELET
Basophils Absolute: 0 10*3/uL (ref 0.0–0.1)
Basophils Relative: 0 %
EOS PCT: 0 %
Eosinophils Absolute: 0 10*3/uL (ref 0.0–0.7)
HEMATOCRIT: 30.9 % — AB (ref 39.0–52.0)
HEMOGLOBIN: 10.3 g/dL — AB (ref 13.0–17.0)
LYMPHS ABS: 1.4 10*3/uL (ref 0.7–4.0)
LYMPHS PCT: 14 %
MCH: 34.1 pg — ABNORMAL HIGH (ref 26.0–34.0)
MCHC: 33.3 g/dL (ref 30.0–36.0)
MCV: 102.3 fL — AB (ref 78.0–100.0)
MONOS PCT: 6 %
Monocytes Absolute: 0.6 10*3/uL (ref 0.1–1.0)
NEUTROS PCT: 80 %
Neutro Abs: 7.7 10*3/uL (ref 1.7–7.7)
Platelets: 251 10*3/uL (ref 150–400)
RBC: 3.02 MIL/uL — AB (ref 4.22–5.81)
RDW: 19.3 % — ABNORMAL HIGH (ref 11.5–15.5)
WBC: 9.7 10*3/uL (ref 4.0–10.5)

## 2015-01-04 LAB — COMPREHENSIVE METABOLIC PANEL
ALK PHOS: 605 U/L — AB (ref 38–126)
ALT: 48 U/L (ref 17–63)
AST: 71 U/L — AB (ref 15–41)
Albumin: 3.3 g/dL — ABNORMAL LOW (ref 3.5–5.0)
Anion gap: 7 (ref 5–15)
BILIRUBIN TOTAL: 0.8 mg/dL (ref 0.3–1.2)
BUN: 7 mg/dL (ref 6–20)
CO2: 28 mmol/L (ref 22–32)
CREATININE: 0.45 mg/dL — AB (ref 0.61–1.24)
Calcium: 9.8 mg/dL (ref 8.9–10.3)
Chloride: 98 mmol/L — ABNORMAL LOW (ref 101–111)
GFR calc Af Amer: >60 mL/min (ref >60)
GLUCOSE: 106 mg/dL — AB (ref 65–99)
Potassium: 3.6 mmol/L (ref 3.5–5.1)
Sodium: 133 mmol/L — ABNORMAL LOW (ref 135–145)
Total Protein: 6.9 g/dL (ref 6.5–8.1)

## 2015-01-04 LAB — URINE MICROSCOPIC-ADD ON: RBC / HPF: NONE SEEN RBC/hpf (ref 0–5)

## 2015-01-04 LAB — I-STAT CG4 LACTIC ACID, ED: Lactic Acid, Venous: 1.06 mmol/L (ref 0.5–2.0)

## 2015-01-04 LAB — LIPASE, BLOOD: LIPASE: 30 U/L (ref 11–51)

## 2015-01-04 MED ORDER — HYDROMORPHONE HCL 1 MG/ML IJ SOLN
1.0000 mg | Freq: Once | INTRAMUSCULAR | Status: AC
  Administered 2015-01-04: 1 mg via INTRAVENOUS
  Filled 2015-01-04: qty 1

## 2015-01-04 MED ORDER — IOHEXOL 300 MG/ML  SOLN
50.0000 mL | Freq: Once | INTRAMUSCULAR | Status: AC | PRN
  Administered 2015-01-04: 50 mL via ORAL

## 2015-01-04 MED ORDER — IOHEXOL 300 MG/ML  SOLN
100.0000 mL | Freq: Once | INTRAMUSCULAR | Status: AC | PRN
  Administered 2015-01-04: 100 mL via INTRAVENOUS

## 2015-01-04 MED ORDER — HEPARIN SOD (PORK) LOCK FLUSH 100 UNIT/ML IV SOLN
500.0000 [IU] | Freq: Once | INTRAVENOUS | Status: AC
  Administered 2015-01-04: 500 [IU]
  Filled 2015-01-04: qty 5

## 2015-01-04 MED ORDER — ONDANSETRON HCL 4 MG/2ML IJ SOLN
4.0000 mg | INTRAMUSCULAR | Status: AC
  Administered 2015-01-04: 4 mg via INTRAVENOUS
  Filled 2015-01-04: qty 2
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
## 2015-01-04 NOTE — Discharge Instructions (Signed)
1. Medications: usual home medications 2. Treatment: rest, drink plenty of fluids 3. Follow Up: please followup with your primary doctor this week for discussion of your diagnoses and further evaluation after today's visit; if you do not have a primary care doctor use the resource guide provided to find one; please return to the ER for new or worsening symptoms, severe pain, persistent vomiting   Abdominal Pain, Adult Many things can cause abdominal pain. Usually, abdominal pain is not caused by a disease and will improve without treatment. It can often be observed and treated at home. Your health care provider will do a physical exam and possibly order blood tests and X-rays to help determine the seriousness of your pain. However, in many cases, more time must pass before a clear cause of the pain can be found. Before that point, your health care provider may not know if you need more testing or further treatment. HOME CARE INSTRUCTIONS Monitor your abdominal pain for any changes. The following actions may help to alleviate any discomfort you are experiencing:  Only take over-the-counter or prescription medicines as directed by your health care provider.  Do not take laxatives unless directed to do so by your health care provider.  Try a clear liquid diet (broth, tea, or water) as directed by your health care provider. Slowly move to a bland diet as tolerated. SEEK MEDICAL CARE IF:  You have unexplained abdominal pain.  You have abdominal pain associated with nausea or diarrhea.  You have pain when you urinate or have a bowel movement.  You experience abdominal pain that wakes you in the night.  You have abdominal pain that is worsened or improved by eating food.  You have abdominal pain that is worsened with eating fatty foods.  You have a fever. SEEK IMMEDIATE MEDICAL CARE IF:  Your pain does not go away within 2 hours.  You keep throwing up (vomiting).  Your pain is felt only  in portions of the abdomen, such as the right side or the left lower portion of the abdomen.  You pass bloody or black tarry stools. MAKE SURE YOU:  Understand these instructions.  Will watch your condition.  Will get help right away if you are not doing well or get worse.   This information is not intended to replace advice given to you by your health care provider. Make sure you discuss any questions you have with your health care provider.   Document Released: 11/11/2004 Document Revised: 10/23/2014 Document Reviewed: 10/11/2012 Elsevier Interactive Patient Education [employer].

## 2015-01-04 NOTE — ED Notes (Signed)
Pt has multiple sites of cancer,  He is very anxious and in severe abdominal pain,  He has a colostomy and hasn't a bowel movement in a few days,  He took two laxatives earlier without relief,  Pt's abdominal cavity is distended

## 2015-01-06 ENCOUNTER — Ambulatory Visit
Admission: RE | Admit: 2015-01-06 | Discharge: 2015-01-06 | Disposition: A | Discharge status: Home / Self Care | Payer: Medicaid Other | Source: Ambulatory Visit | Attending: Radiation Oncology | Admitting: Radiation Oncology

## 2015-01-06 ENCOUNTER — Encounter: Payer: Self-pay | Admitting: *Deleted

## 2015-01-06 VITALS — BP 111/67 | HR 101 | Temp 97.7°F | Resp 20 | Wt 135.4 lb

## 2015-01-06 DIAGNOSIS — C7931 Secondary malignant neoplasm of brain: Secondary | ICD-10-CM

## 2015-01-06 DIAGNOSIS — C7949 Secondary malignant neoplasm of other parts of nervous system: Secondary | ICD-10-CM

## 2015-01-06 DIAGNOSIS — C3411 Malignant neoplasm of upper lobe, right bronchus or lung: Secondary | ICD-10-CM | POA: Diagnosis present

## 2015-01-06 DIAGNOSIS — R918 Other nonspecific abnormal finding of lung field: Secondary | ICD-10-CM

## 2015-01-06 DIAGNOSIS — Z510 Encounter for antineoplastic radiation therapy: Secondary | ICD-10-CM | POA: Insufficient documentation

## 2015-01-06 MED ORDER — SODIUM CHLORIDE 0.9 % IJ SOLN
10.0000 mL | INTRAMUSCULAR | Status: DC | PRN
Start: 2015-01-06 — End: 2015-01-07
  Administered 2015-01-06: 10 mL via INTRAVENOUS

## 2015-01-06 MED ORDER — HEPARIN SOD (PORK) LOCK FLUSH 100 UNIT/ML IV SOLN
500.0000 [IU] | Freq: Once | INTRAVENOUS | Status: AC
  Administered 2015-01-06: 500 [IU] via INTRAVENOUS

## 2015-01-06 NOTE — Progress Notes (Signed)

## 2015-01-06 NOTE — Progress Notes (Signed)
Called patient to verify patient coming to appointment.  Appointment at 9:15 am.  No answer from home phone number, called significant other listed on contact list.  [name], states they were told they were told to come 20 mins before simulation and will be here in approximately 5 mins.

## 2015-01-06 NOTE — Progress Notes (Addendum)
  Radiation Oncology         [phone] ________________________________  Name: [name] MRN: [id number]  Date: 01/06/2015  DOB: [birth year]  SIMULATION AND TREATMENT PLANNING NOTE  Outpatient  DIAGNOSIS:  Cancer of right upper lobe of lung, metastatic, C34.11   NARRATIVE:  The patient was brought to the [facility].  Identity was confirmed.  All relevant records and images related to the planned course of therapy were reviewed.  The patient freely provided informed written consent to proceed with treatment after reviewing the details related to the planned course of therapy. The consent form was witnessed and verified by the simulation staff.    Then, the patient was set-up in a stable reproducible  supine position for radiation therapy.  CT images were obtained with contrast.  Surface markings were placed.  The CT images were loaded into the planning software.    TREATMENT PLANNING NOTE: Treatment planning then occurred.  The radiation prescription was entered and confirmed.    A total of 2 medically necessary complex treatment devices were fabricated and supervised by me, in the form of 2 fields with MLCS to block esophagus, lung, heart, cord. MORE FIELDS WITH MLCs MAY BE ADDED IN DOSIMETRY for dose homogeneity.  I have requested : 3D Simulation  I have requested a DVH of the following structures: lung, GTV, heart, esophagus, cord.    The patient will receive 20 Gy in 5 fractions to his right upper mediastinal disease.   -----------------------------------  Eppie Gibson, MD

## 2015-01-06 NOTE — Progress Notes (Signed)
SIM complete. Flushed power port per protocol. Removed access needle. Needle intact upon removal. Patient tolerated removal well. Applied a bandaid to de-accessed site. Patient discharged home with loved one.

## 2015-01-07 DIAGNOSIS — Z510 Encounter for antineoplastic radiation therapy: Secondary | ICD-10-CM | POA: Diagnosis not present

## 2015-01-10 ENCOUNTER — Telehealth: Payer: Self-pay | Admitting: *Deleted

## 2015-01-10 NOTE — Telephone Encounter (Signed)
S/w Mariel Kansky at [facility] to notify that pt's Radiation Treatments have been extended through next week.  He is scheduled all next week w/ final treatment on 12/2.  She will let the interdisciplinary team know.

## 2015-01-13 ENCOUNTER — Encounter: Payer: Self-pay | Admitting: Radiation Oncology

## 2015-01-13 ENCOUNTER — Ambulatory Visit
Admission: RE | Admit: 2015-01-13 | Discharge: 2015-01-13 | Disposition: A | Discharge status: Home / Self Care | Payer: Medicaid Other | Source: Ambulatory Visit | Attending: Radiation Oncology | Admitting: Radiation Oncology

## 2015-01-13 VITALS — BP 120/71 | HR 93 | Temp 97.6°F | Ht 65.0 in | Wt 135.9 lb

## 2015-01-13 DIAGNOSIS — C7949 Secondary malignant neoplasm of other parts of nervous system: Principal | ICD-10-CM

## 2015-01-13 DIAGNOSIS — Z510 Encounter for antineoplastic radiation therapy: Secondary | ICD-10-CM | POA: Diagnosis not present

## 2015-01-13 DIAGNOSIS — C7931 Secondary malignant neoplasm of brain: Secondary | ICD-10-CM

## 2015-01-13 NOTE — Progress Notes (Signed)
[name] has completed 1 fraction to his chest.  He reports pain in his left side, between his shoulder blades and has new pain that started yesterday under his right arm.  His pain today is a 6/10.  He reports he is taking 2.5 mg of methadone bid and morphine q 4 hours.  He reports having difficulty taking in a deep breath.  He reports a frequent, productive cough with green sputum.  He reports it takes awhile to bring anything up.  He reports the cough is less than it was.  He reports having trouble swallowing that started a week and a half ago.  He reports having to eat and drink slowly.  He reports having constipation.  He last emptied his colostomy bag on Saturday.  He reports having a poor appetite.  He is taking stool softener and a laxative.  He reports taking decadron 2 mg every other day.  BP 120/71 mmHg  Pulse 93  Temp(Src) 97.6 F (36.4 C) (Oral)  Ht '5\' 5"'$  (1.651 m)  Wt 135 lb 14.4 oz (61.644 kg)  BMI 22.62 kg/m2  SpO2 98%   Wt Readings from Last 3 Encounters:  01/13/15 135 lb 14.4 oz (61.644 kg)  01/06/15 135 lb 6.4 oz (61.417 kg)  01/03/15 136 lb 11.2 oz (62.007 kg)

## 2015-01-13 NOTE — Progress Notes (Addendum)
   Weekly Management Note:  Outpatient Cancer of right upper lobe of lung, metastatic, C34.11  Current Dose:  4 Gy  Projected Dose: 20 Gy   Narrative:  The patient presents for routine under treatment assessment.  CBCT/MVCT images/Port film x-rays were reviewed.  The chart was checked. I met with patient and spoke with his SO by phone. finishing decadron taper. Forgetful. Some swallowing difficulty, not severe.  Pain between shoulder blades, taking pain meds for this.  Physical Findings:  height is $RemoveB'5\' 5"'YJXxsUru$  (1.651 m) and weight is 135 lb 14.4 oz (61.644 kg). His oral temperature is 97.6 F (36.4 C). His blood pressure is 120/71 and his pulse is 93. His oxygen saturation is 98%.   Wt Readings from Last 3 Encounters:  01/13/15 135 lb 14.4 oz (61.644 kg)  01/06/15 135 lb 6.4 oz (61.417 kg)  01/03/15 136 lb 11.2 oz (62.007 kg)   NAD, no cyanosis or swelling in face. Ambulatory.  Impression:  The patient is tolerating radiotherapy.  Plan:  Continue radiotherapy as planned.  Hospice possibly to follow. ________________________________   Eppie Gibson, M.D.

## 2015-01-14 ENCOUNTER — Ambulatory Visit
Admission: RE | Admit: 2015-01-14 | Discharge: 2015-01-14 | Disposition: A | Discharge status: Home / Self Care | Payer: Medicaid Other | Source: Ambulatory Visit | Attending: Radiation Oncology | Admitting: Radiation Oncology

## 2015-01-14 DIAGNOSIS — Z510 Encounter for antineoplastic radiation therapy: Secondary | ICD-10-CM | POA: Diagnosis not present

## 2015-01-15 ENCOUNTER — Ambulatory Visit
Admission: RE | Admit: 2015-01-15 | Discharge: 2015-01-15 | Disposition: A | Discharge status: Home / Self Care | Payer: Medicaid Other | Source: Ambulatory Visit | Attending: Radiation Oncology | Admitting: Radiation Oncology

## 2015-01-15 ENCOUNTER — Ambulatory Visit: Payer: Medicaid Other

## 2015-01-15 DIAGNOSIS — Z510 Encounter for antineoplastic radiation therapy: Secondary | ICD-10-CM | POA: Diagnosis not present

## 2015-01-16 ENCOUNTER — Ambulatory Visit: Payer: Medicaid Other

## 2015-01-16 ENCOUNTER — Ambulatory Visit
Admission: RE | Admit: 2015-01-16 | Discharge: 2015-01-16 | Disposition: A | Discharge status: Home / Self Care | Payer: Medicaid Other | Source: Ambulatory Visit | Attending: Radiation Oncology | Admitting: Radiation Oncology

## 2015-01-16 DIAGNOSIS — Z510 Encounter for antineoplastic radiation therapy: Secondary | ICD-10-CM | POA: Diagnosis not present

## 2015-01-17 ENCOUNTER — Ambulatory Visit
Admission: RE | Admit: 2015-01-17 | Discharge: 2015-01-17 | Disposition: A | Discharge status: Home / Self Care | Payer: Medicaid Other | Source: Ambulatory Visit | Attending: Radiation Oncology | Admitting: Radiation Oncology

## 2015-01-17 ENCOUNTER — Ambulatory Visit: Payer: Self-pay | Admitting: Diagnostic Neuroimaging

## 2015-01-17 ENCOUNTER — Encounter: Payer: Self-pay | Admitting: Radiation Oncology

## 2015-01-17 VITALS — BP 111/66 | HR 95 | Temp 98.0°F | Ht 65.0 in | Wt 137.0 lb

## 2015-01-17 DIAGNOSIS — C7949 Secondary malignant neoplasm of other parts of nervous system: Principal | ICD-10-CM

## 2015-01-17 DIAGNOSIS — C3411 Malignant neoplasm of upper lobe, right bronchus or lung: Secondary | ICD-10-CM

## 2015-01-17 DIAGNOSIS — C7931 Secondary malignant neoplasm of brain: Secondary | ICD-10-CM

## 2015-01-17 DIAGNOSIS — Z510 Encounter for antineoplastic radiation therapy: Secondary | ICD-10-CM | POA: Diagnosis not present

## 2015-01-17 NOTE — Progress Notes (Addendum)
   Weekly Management Note:  Outpatient  Cancer of right upper lobe of lung, metastatic, C34.11  Current Dose:  20 Gy  Projected Dose: 20 Gy   Narrative:  The patient presents for routine under treatment assessment.  CBCT/MVCT images/Port film x-rays were reviewed.  The chart was checked. Denies acute effects from RT  Physical Findings:  height is '5\' 5"'$  (1.651 m) and weight is 137 lb (62.143 kg). His oral temperature is 98 F (36.7 C). His blood pressure is 111/66 and his pulse is 95. His oxygen saturation is 96%.   Wt Readings from Last 3 Encounters:  01/17/15 137 lb (62.143 kg)  01/13/15 135 lb 14.4 oz (61.644 kg)  01/06/15 135 lb 6.4 oz (61.417 kg)   NAD, no cyanosis or swelling in face. Ambulatory.  Impression:  The patient has tolerated radiotherapy.  Plan:   Hospice  to follow. F/u in January. ________________________________   Eppie Gibson, M.D.

## 2015-01-17 NOTE — Progress Notes (Signed)
[name] here for last fraction#5.  Given ine month F/U appointment card.    BP 111/66 mmHg  Pulse 95  Temp(Src) 98 F (36.7 C) (Oral)  Ht '5\' 5"'$  (1.651 m)  Wt 137 lb (62.143 kg)  BMI 22.80 kg/m2  SpO2 96%

## 2015-01-22 ENCOUNTER — Other Ambulatory Visit: Payer: Self-pay | Admitting: Hematology and Oncology

## 2015-01-28 DIAGNOSIS — C3411 Malignant neoplasm of upper lobe, right bronchus or lung: Secondary | ICD-10-CM | POA: Insufficient documentation

## 2015-01-28 NOTE — Progress Notes (Signed)
[facility] End of Treatment Note  Name:[name]  Date: 01/17/2015 FEO:[id number] DOB:12-13-1970   Status:outpatient     REFERRING PHYSICIAN: Ni Gorsuch      DIAGNOSIS: Metastatic lung cancer    INDICATION FOR TREATMENT: Palliative   TREATMENT DATES: 11-28 to 01-17-15                         SITE/DOSE:     Upper chest - mediastinum / 20 Gy in 5 fractions                     BEAMS/ENERGY:  3D conformal  / 10 X               NARRATIVE:     He tolerated treatment relatively well.  Plans to enroll on hospice.                       PLAN: Routine followup in one month. Patient instructed to call if questions or worsening complaints in interim.  -----------------------------------  Eppie Gibson, MD

## 2015-01-28 NOTE — Addendum Note (Signed)
Encounter addended by: Eppie Gibson, MD on: 01/28/2015  9:52 AM<BR>     Documentation filed: Notes Section

## 2015-01-28 NOTE — Addendum Note (Signed)
Encounter addended by: Eppie Gibson, MD on: 01/28/2015  9:51 AM<BR>     Documentation filed: Notes Section

## 2015-01-28 NOTE — Addendum Note (Signed)
Encounter addended by: Eppie Gibson, MD on: 01/28/2015  9:50 AM<BR>     Documentation filed: Notes Section

## 2015-02-04 ENCOUNTER — Encounter: Payer: Self-pay | Admitting: Radiation Therapy

## 2015-02-04 ENCOUNTER — Other Ambulatory Visit: Payer: Self-pay | Admitting: Radiation Therapy

## 2015-02-04 DIAGNOSIS — C7931 Secondary malignant neoplasm of brain: Secondary | ICD-10-CM

## 2015-02-04 NOTE — Progress Notes (Signed)
Pt has home hospice care now. Per his wife, his needs are being met and his symptoms well managed.   Mont Dutton

## 2015-02-07 ENCOUNTER — Telehealth: Payer: Self-pay | Admitting: *Deleted

## 2015-02-07 NOTE — Telephone Encounter (Signed)
Adella with [facility] called to state of pt's death at 832 pm on 05-Mar-2015.  This information will be forwarded to MD and nurse at desk for communication.

## 2015-02-16 DEATH — deceased

## 2015-03-07 ENCOUNTER — Ambulatory Visit: Payer: Medicaid Other | Admitting: Radiation Oncology
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
